# Patient Record
Sex: Female | Born: 1955 | Race: Black or African American | Hispanic: No | Smoking: Former smoker
Health system: Southern US, Community
[De-identification: ages and names within clinical notes are randomized; demographics above are authoritative.]

## PROBLEM LIST (undated history)

## (undated) DIAGNOSIS — R102 Pelvic and perineal pain: Secondary | ICD-10-CM

## (undated) DIAGNOSIS — E039 Hypothyroidism, unspecified: Secondary | ICD-10-CM

## (undated) DIAGNOSIS — R896 Abnormal cytological findings in specimens from other organs, systems and tissues: Secondary | ICD-10-CM

## (undated) DIAGNOSIS — M1990 Unspecified osteoarthritis, unspecified site: Secondary | ICD-10-CM

## (undated) DIAGNOSIS — N946 Dysmenorrhea, unspecified: Secondary | ICD-10-CM

## (undated) DIAGNOSIS — IMO0002 Reserved for concepts with insufficient information to code with codable children: Secondary | ICD-10-CM

## (undated) DIAGNOSIS — I2510 Atherosclerotic heart disease of native coronary artery without angina pectoris: Secondary | ICD-10-CM

## (undated) DIAGNOSIS — M5030 Other cervical disc degeneration, unspecified cervical region: Secondary | ICD-10-CM

## (undated) DIAGNOSIS — M5136 Other intervertebral disc degeneration, lumbar region: Secondary | ICD-10-CM

## (undated) DIAGNOSIS — K5730 Diverticulosis of large intestine without perforation or abscess without bleeding: Secondary | ICD-10-CM

## (undated) DIAGNOSIS — I10 Essential (primary) hypertension: Secondary | ICD-10-CM

## (undated) DIAGNOSIS — IMO0001 Reserved for inherently not codable concepts without codable children: Secondary | ICD-10-CM

## (undated) DIAGNOSIS — E785 Hyperlipidemia, unspecified: Secondary | ICD-10-CM

## (undated) DIAGNOSIS — Z8619 Personal history of other infectious and parasitic diseases: Secondary | ICD-10-CM

## (undated) DIAGNOSIS — N870 Mild cervical dysplasia: Secondary | ICD-10-CM

## (undated) DIAGNOSIS — N809 Endometriosis, unspecified: Secondary | ICD-10-CM

## (undated) DIAGNOSIS — E041 Nontoxic single thyroid nodule: Secondary | ICD-10-CM

## (undated) DIAGNOSIS — F419 Anxiety disorder, unspecified: Secondary | ICD-10-CM

## (undated) DIAGNOSIS — N63 Unspecified lump in unspecified breast: Secondary | ICD-10-CM

## (undated) DIAGNOSIS — K5190 Ulcerative colitis, unspecified, without complications: Secondary | ICD-10-CM

## (undated) DIAGNOSIS — Z86010 Personal history of colonic polyps: Secondary | ICD-10-CM

## (undated) HISTORY — PX: FOOT SURGERY: SHX648

## (undated) HISTORY — DX: Diverticulosis of large intestine without perforation or abscess without bleeding: K57.30

## (undated) HISTORY — DX: Other intervertebral disc degeneration, lumbar region: M51.36

## (undated) HISTORY — DX: Hypothyroidism, unspecified: E03.9

## (undated) HISTORY — DX: Abnormal cytological findings in specimens from other organs, systems and tissues: R89.6

## (undated) HISTORY — DX: Nontoxic single thyroid nodule: E04.1

## (undated) HISTORY — DX: Hyperlipidemia, unspecified: E78.5

## (undated) HISTORY — DX: Other cervical disc degeneration, unspecified cervical region: M50.30

## (undated) HISTORY — DX: Dysmenorrhea, unspecified: N94.6

## (undated) HISTORY — DX: Endometriosis, unspecified: N80.9

## (undated) HISTORY — DX: Unspecified lump in unspecified breast: N63.0

## (undated) HISTORY — DX: Essential (primary) hypertension: I10

## (undated) HISTORY — DX: Reserved for inherently not codable concepts without codable children: IMO0001

## (undated) HISTORY — PX: COLONOSCOPY: SHX174

## (undated) HISTORY — PX: TOTAL ABDOMINAL HYSTERECTOMY: SHX209

## (undated) HISTORY — DX: Personal history of other infectious and parasitic diseases: Z86.19

## (undated) HISTORY — DX: Pelvic and perineal pain: R10.2

## (undated) HISTORY — PX: BREAST BIOPSY: SHX20

## (undated) HISTORY — DX: Mild cervical dysplasia: N87.0

## (undated) HISTORY — PX: BACK SURGERY: SHX140

## (undated) HISTORY — DX: Personal history of colonic polyps: Z86.010

## (undated) HISTORY — DX: Reserved for concepts with insufficient information to code with codable children: IMO0002

---

## 1997-08-28 ENCOUNTER — Ambulatory Visit (HOSPITAL_COMMUNITY): Admission: RE | Admit: 1997-08-28 | Discharge: 1997-08-28 | Payer: Self-pay | Admitting: Obstetrics and Gynecology

## 1998-02-17 ENCOUNTER — Ambulatory Visit (HOSPITAL_COMMUNITY): Admission: RE | Admit: 1998-02-17 | Discharge: 1998-02-17 | Payer: Self-pay | Admitting: Obstetrics and Gynecology

## 1998-02-17 ENCOUNTER — Encounter: Payer: Self-pay | Admitting: Obstetrics and Gynecology

## 1998-03-04 ENCOUNTER — Encounter: Payer: Self-pay | Admitting: Obstetrics and Gynecology

## 1998-03-04 ENCOUNTER — Ambulatory Visit (HOSPITAL_COMMUNITY): Admission: RE | Admit: 1998-03-04 | Discharge: 1998-03-04 | Payer: Self-pay | Admitting: Obstetrics and Gynecology

## 1998-04-25 ENCOUNTER — Emergency Department (HOSPITAL_COMMUNITY): Admission: EM | Admit: 1998-04-25 | Discharge: 1998-04-25 | Payer: Self-pay | Admitting: Emergency Medicine

## 1998-04-25 ENCOUNTER — Encounter: Payer: Self-pay | Admitting: Endocrinology

## 1999-03-29 ENCOUNTER — Other Ambulatory Visit: Admission: RE | Admit: 1999-03-29 | Discharge: 1999-03-29 | Payer: Self-pay | Admitting: Obstetrics and Gynecology

## 1999-05-19 ENCOUNTER — Encounter: Payer: Self-pay | Admitting: Obstetrics and Gynecology

## 1999-05-19 ENCOUNTER — Ambulatory Visit (HOSPITAL_COMMUNITY): Admission: RE | Admit: 1999-05-19 | Discharge: 1999-05-19 | Payer: Self-pay | Admitting: Obstetrics and Gynecology

## 1999-05-27 ENCOUNTER — Encounter (INDEPENDENT_AMBULATORY_CARE_PROVIDER_SITE_OTHER): Payer: Self-pay

## 1999-05-27 ENCOUNTER — Other Ambulatory Visit: Admission: RE | Admit: 1999-05-27 | Discharge: 1999-05-27 | Payer: Self-pay | Admitting: Obstetrics and Gynecology

## 1999-10-07 ENCOUNTER — Other Ambulatory Visit: Admission: RE | Admit: 1999-10-07 | Discharge: 1999-10-07 | Payer: Self-pay | Admitting: Obstetrics and Gynecology

## 1999-11-09 ENCOUNTER — Ambulatory Visit (HOSPITAL_COMMUNITY): Admission: RE | Admit: 1999-11-09 | Discharge: 1999-11-09 | Payer: Self-pay | Admitting: Obstetrics and Gynecology

## 2000-04-05 ENCOUNTER — Other Ambulatory Visit: Admission: RE | Admit: 2000-04-05 | Discharge: 2000-04-05 | Payer: Self-pay | Admitting: Obstetrics and Gynecology

## 2000-05-24 ENCOUNTER — Encounter (INDEPENDENT_AMBULATORY_CARE_PROVIDER_SITE_OTHER): Payer: Self-pay

## 2000-05-24 ENCOUNTER — Other Ambulatory Visit: Admission: RE | Admit: 2000-05-24 | Discharge: 2000-05-24 | Payer: Self-pay | Admitting: Obstetrics and Gynecology

## 2000-05-25 ENCOUNTER — Encounter: Payer: Self-pay | Admitting: Obstetrics and Gynecology

## 2000-05-25 ENCOUNTER — Ambulatory Visit (HOSPITAL_COMMUNITY): Admission: RE | Admit: 2000-05-25 | Discharge: 2000-05-25 | Payer: Self-pay | Admitting: Obstetrics and Gynecology

## 2000-09-04 ENCOUNTER — Encounter: Admission: RE | Admit: 2000-09-04 | Discharge: 2000-09-04 | Payer: Self-pay | Admitting: Emergency Medicine

## 2000-09-04 ENCOUNTER — Encounter: Payer: Self-pay | Admitting: Emergency Medicine

## 2000-09-22 ENCOUNTER — Encounter: Payer: Self-pay | Admitting: Emergency Medicine

## 2000-09-22 ENCOUNTER — Encounter: Admission: RE | Admit: 2000-09-22 | Discharge: 2000-09-22 | Payer: Self-pay | Admitting: Emergency Medicine

## 2000-11-23 ENCOUNTER — Encounter: Admission: RE | Admit: 2000-11-23 | Discharge: 2000-11-23 | Payer: Self-pay | Admitting: Obstetrics and Gynecology

## 2000-11-23 ENCOUNTER — Encounter: Payer: Self-pay | Admitting: Obstetrics and Gynecology

## 2000-12-03 ENCOUNTER — Ambulatory Visit (HOSPITAL_COMMUNITY): Admission: RE | Admit: 2000-12-03 | Discharge: 2000-12-03 | Payer: Self-pay | Admitting: Neurosurgery

## 2000-12-03 ENCOUNTER — Encounter: Payer: Self-pay | Admitting: Neurosurgery

## 2000-12-14 ENCOUNTER — Emergency Department (HOSPITAL_COMMUNITY): Admission: EM | Admit: 2000-12-14 | Discharge: 2000-12-14 | Payer: Self-pay

## 2000-12-14 ENCOUNTER — Encounter: Payer: Self-pay | Admitting: Emergency Medicine

## 2001-01-24 ENCOUNTER — Other Ambulatory Visit: Admission: RE | Admit: 2001-01-24 | Discharge: 2001-01-24 | Payer: Self-pay | Admitting: Obstetrics and Gynecology

## 2001-02-05 ENCOUNTER — Encounter: Payer: Self-pay | Admitting: Neurosurgery

## 2001-02-05 ENCOUNTER — Ambulatory Visit (HOSPITAL_COMMUNITY): Admission: RE | Admit: 2001-02-05 | Discharge: 2001-02-05 | Payer: Self-pay | Admitting: Neurosurgery

## 2001-04-08 ENCOUNTER — Other Ambulatory Visit: Admission: RE | Admit: 2001-04-08 | Discharge: 2001-04-08 | Payer: Self-pay | Admitting: Obstetrics and Gynecology

## 2001-05-28 ENCOUNTER — Ambulatory Visit (HOSPITAL_COMMUNITY): Admission: RE | Admit: 2001-05-28 | Discharge: 2001-05-28 | Payer: Self-pay | Admitting: Obstetrics and Gynecology

## 2001-05-28 ENCOUNTER — Encounter: Payer: Self-pay | Admitting: Obstetrics and Gynecology

## 2001-06-06 ENCOUNTER — Encounter: Payer: Self-pay | Admitting: Obstetrics and Gynecology

## 2001-06-06 ENCOUNTER — Encounter: Admission: RE | Admit: 2001-06-06 | Discharge: 2001-06-06 | Payer: Self-pay | Admitting: Obstetrics and Gynecology

## 2001-12-03 ENCOUNTER — Encounter: Admission: RE | Admit: 2001-12-03 | Discharge: 2001-12-03 | Payer: Self-pay | Admitting: Obstetrics and Gynecology

## 2001-12-03 ENCOUNTER — Encounter: Payer: Self-pay | Admitting: Obstetrics and Gynecology

## 2001-12-18 ENCOUNTER — Other Ambulatory Visit: Admission: RE | Admit: 2001-12-18 | Discharge: 2001-12-18 | Payer: Self-pay | Admitting: Obstetrics and Gynecology

## 2001-12-27 ENCOUNTER — Encounter: Payer: Self-pay | Admitting: Obstetrics and Gynecology

## 2001-12-27 ENCOUNTER — Encounter: Admission: RE | Admit: 2001-12-27 | Discharge: 2001-12-27 | Payer: Self-pay | Admitting: Obstetrics and Gynecology

## 2002-01-30 ENCOUNTER — Encounter: Payer: Self-pay | Admitting: Emergency Medicine

## 2002-01-30 ENCOUNTER — Encounter: Admission: RE | Admit: 2002-01-30 | Discharge: 2002-01-30 | Payer: Self-pay | Admitting: Emergency Medicine

## 2002-04-16 ENCOUNTER — Other Ambulatory Visit: Admission: RE | Admit: 2002-04-16 | Discharge: 2002-04-16 | Payer: Self-pay | Admitting: Obstetrics and Gynecology

## 2002-05-01 HISTORY — PX: ANTERIOR CERVICAL DECOMP/DISCECTOMY FUSION: SHX1161

## 2002-06-24 ENCOUNTER — Encounter: Admission: RE | Admit: 2002-06-24 | Discharge: 2002-06-24 | Payer: Self-pay | Admitting: Obstetrics and Gynecology

## 2002-06-24 ENCOUNTER — Encounter: Payer: Self-pay | Admitting: Obstetrics and Gynecology

## 2002-09-17 ENCOUNTER — Other Ambulatory Visit: Admission: RE | Admit: 2002-09-17 | Discharge: 2002-09-17 | Payer: Self-pay | Admitting: Obstetrics and Gynecology

## 2002-11-07 ENCOUNTER — Encounter (INDEPENDENT_AMBULATORY_CARE_PROVIDER_SITE_OTHER): Payer: Self-pay | Admitting: Specialist

## 2002-11-07 ENCOUNTER — Ambulatory Visit (HOSPITAL_COMMUNITY): Admission: RE | Admit: 2002-11-07 | Discharge: 2002-11-07 | Payer: Self-pay | Admitting: Gastroenterology

## 2003-03-12 ENCOUNTER — Encounter (INDEPENDENT_AMBULATORY_CARE_PROVIDER_SITE_OTHER): Payer: Self-pay | Admitting: *Deleted

## 2003-03-12 ENCOUNTER — Ambulatory Visit (HOSPITAL_BASED_OUTPATIENT_CLINIC_OR_DEPARTMENT_OTHER): Admission: RE | Admit: 2003-03-12 | Discharge: 2003-03-12 | Payer: Self-pay | Admitting: Plastic Surgery

## 2003-04-22 ENCOUNTER — Other Ambulatory Visit: Admission: RE | Admit: 2003-04-22 | Discharge: 2003-04-22 | Payer: Self-pay | Admitting: Obstetrics and Gynecology

## 2003-07-09 ENCOUNTER — Encounter: Admission: RE | Admit: 2003-07-09 | Discharge: 2003-07-09 | Payer: Self-pay | Admitting: Obstetrics and Gynecology

## 2003-07-22 ENCOUNTER — Encounter: Admission: RE | Admit: 2003-07-22 | Discharge: 2003-07-22 | Payer: Self-pay | Admitting: Emergency Medicine

## 2003-07-28 ENCOUNTER — Inpatient Hospital Stay (HOSPITAL_COMMUNITY): Admission: RE | Admit: 2003-07-28 | Discharge: 2003-07-30 | Payer: Self-pay | Admitting: Obstetrics and Gynecology

## 2003-07-28 ENCOUNTER — Encounter (INDEPENDENT_AMBULATORY_CARE_PROVIDER_SITE_OTHER): Payer: Self-pay | Admitting: *Deleted

## 2004-02-07 ENCOUNTER — Encounter: Admission: RE | Admit: 2004-02-07 | Discharge: 2004-02-07 | Payer: Self-pay | Admitting: Emergency Medicine

## 2004-05-10 ENCOUNTER — Other Ambulatory Visit: Admission: RE | Admit: 2004-05-10 | Discharge: 2004-05-10 | Payer: Self-pay | Admitting: Obstetrics and Gynecology

## 2004-05-16 ENCOUNTER — Encounter: Admission: RE | Admit: 2004-05-16 | Discharge: 2004-05-16 | Payer: Self-pay | Admitting: Obstetrics and Gynecology

## 2004-05-30 ENCOUNTER — Encounter: Admission: RE | Admit: 2004-05-30 | Discharge: 2004-05-30 | Payer: Self-pay | Admitting: Obstetrics and Gynecology

## 2005-01-25 ENCOUNTER — Ambulatory Visit (HOSPITAL_COMMUNITY): Admission: RE | Admit: 2005-01-25 | Discharge: 2005-01-25 | Payer: Self-pay | Admitting: Emergency Medicine

## 2005-05-24 ENCOUNTER — Other Ambulatory Visit: Admission: RE | Admit: 2005-05-24 | Discharge: 2005-05-24 | Payer: Self-pay | Admitting: Obstetrics and Gynecology

## 2005-06-07 ENCOUNTER — Encounter: Admission: RE | Admit: 2005-06-07 | Discharge: 2005-06-07 | Payer: Self-pay | Admitting: Obstetrics and Gynecology

## 2006-02-16 ENCOUNTER — Encounter: Admission: RE | Admit: 2006-02-16 | Discharge: 2006-02-16 | Payer: Self-pay | Admitting: Emergency Medicine

## 2006-11-22 ENCOUNTER — Encounter: Admission: RE | Admit: 2006-11-22 | Discharge: 2006-11-22 | Payer: Self-pay | Admitting: Obstetrics and Gynecology

## 2008-01-31 ENCOUNTER — Ambulatory Visit (HOSPITAL_COMMUNITY): Admission: RE | Admit: 2008-01-31 | Discharge: 2008-01-31 | Payer: Self-pay | Admitting: Obstetrics and Gynecology

## 2008-02-10 ENCOUNTER — Encounter: Admission: RE | Admit: 2008-02-10 | Discharge: 2008-02-10 | Payer: Self-pay | Admitting: Obstetrics and Gynecology

## 2008-03-02 ENCOUNTER — Ambulatory Visit (HOSPITAL_BASED_OUTPATIENT_CLINIC_OR_DEPARTMENT_OTHER): Admission: RE | Admit: 2008-03-02 | Discharge: 2008-03-02 | Payer: Self-pay

## 2008-09-10 ENCOUNTER — Encounter: Admission: RE | Admit: 2008-09-10 | Discharge: 2008-09-10 | Payer: Self-pay | Admitting: Obstetrics and Gynecology

## 2008-09-18 ENCOUNTER — Ambulatory Visit: Payer: Self-pay | Admitting: Internal Medicine

## 2008-10-14 ENCOUNTER — Ambulatory Visit: Payer: Self-pay | Admitting: Gastroenterology

## 2008-10-14 ENCOUNTER — Encounter: Payer: Self-pay | Admitting: Gastroenterology

## 2008-10-16 ENCOUNTER — Encounter: Payer: Self-pay | Admitting: Gastroenterology

## 2008-10-21 ENCOUNTER — Telehealth: Payer: Self-pay | Admitting: Gastroenterology

## 2008-10-30 ENCOUNTER — Ambulatory Visit (HOSPITAL_COMMUNITY): Admission: RE | Admit: 2008-10-30 | Discharge: 2008-10-30 | Payer: Self-pay | Admitting: Internal Medicine

## 2008-11-05 DIAGNOSIS — Z860100 Personal history of colon polyps, unspecified: Secondary | ICD-10-CM | POA: Insufficient documentation

## 2008-11-05 DIAGNOSIS — K5730 Diverticulosis of large intestine without perforation or abscess without bleeding: Secondary | ICD-10-CM | POA: Insufficient documentation

## 2008-11-10 ENCOUNTER — Ambulatory Visit: Payer: Self-pay | Admitting: Gastroenterology

## 2008-11-10 DIAGNOSIS — E039 Hypothyroidism, unspecified: Secondary | ICD-10-CM

## 2008-11-10 HISTORY — DX: Hypothyroidism, unspecified: E03.9

## 2009-06-01 HISTORY — PX: THYROIDECTOMY, PARTIAL: SHX18

## 2010-05-22 ENCOUNTER — Encounter: Payer: Self-pay | Admitting: Obstetrics and Gynecology

## 2010-05-31 NOTE — Procedures (Signed)
Summary: Colonoscopy   Colonoscopy  Procedure date:  10/14/2008  Findings:      Location:  [facility].    Procedures Next Due Date:    Colonoscopy: 10/2011  COLONOSCOPY PROCEDURE REPORT  PATIENT:  Deborah, Stewart  MR#:  [id number] BIRTHDATE:   1955-08-29, 55 yrs. old   GENDER:   female  ENDOSCOPIST:   Vania Rea. Jarold Motto, MD, [facility] Referred by: Dorothyann Peng, M.D.  PROCEDURE DATE:  10/14/2008 PROCEDURE:  Colonoscopy with hot biopsy, Colonoscopy with snare polypectomy ASA CLASS:   Class II INDICATIONS: history of pre-cancerous (adenomatous) colon polyps   MEDICATIONS:    Fentanyl 125 mcg IV, Versed 12 mg  DESCRIPTION OF PROCEDURE:   After the risks benefits and alternatives of the procedure were thoroughly explained, informed consent was obtained.  Digital rectal exam was performed and revealed no abnormalities.   The LB [id number] endoscope was introduced through the anus and advanced to the cecum, which was identified by both the appendix and ileocecal valve, limited by a tortuous and redundant colon, poor preparation.    The quality of the prep was good, using MoviPrep.  The instrument was then slowly withdrawn as the colon was fully examined. <<PROCEDUREIMAGES>>      <<OLD IMAGES>>  FINDINGS:  Moderate diverticulosis was found sigmoid to descending  A sessile polyp was found in the descending colon. It was hemorrhagic and large. Polyp was snared, then cauterized with monopolar cautery. Retrieval was successful. snare polyp  There were multiple polyps identified and removed. in the rectum. They were diminutive and likely benign. With hot biopsy forceps, biopsy was obtained and sent to pathology.   Retroflexed views in the rectum revealed hemorrhoids.    The scope was then withdrawn from the patient and the procedure completed.  COMPLICATIONS:   None  ENDOSCOPIC IMPRESSION:  1) Moderate diverticulosis in the sigmoid to descending  2) Sessile polyp in the  descending colon  3) Polyps, multiple in the rectum  4) Hemorrhoids RECOMMENDATIONS:  1) high fiber diet  2) If the polyp(s) removed today are proven to be adenomatous (pre-cancerous) polyps, you will need a colonoscopy in 3 years. Otherwise you should continue to follow colorectal cancer screening guidelines for "routine risk" patients with a colonoscopy in 10 years.  REPEAT EXAM:   No   _______________________________ Vania Rea. Jarold Motto, MD, [facility]  CC:        REPORT OF SURGICAL PATHOLOGY   Case #: [id number] Patient Name: Deborah Stewart, Deborah Stewart. Office Chart Number:  [id number]   MRN: [id number] Pathologist: Alden Server A. Delila Spence, MD DOB/Age  09-09-1955 (Age: 55)    Gender: F Date Taken:  10/14/2008 Date Received: 10/15/2008   FINAL DIAGNOSIS   ***MICROSCOPIC EXAMINATION AND DIAGNOSIS***   1. DESCENDING COLON, POLYP:  TUBULAR ADENOMA.  NO HIGH GRADE DYSPLASIA OR MALIGNANCY IDENTIFIED.   2. RECTUM, POLYP(S):  HYPERPLASTIC POLYPS (2). NO ADENOMATOUS CHANGES OR EVIDENCE OF MALIGNANCY IDENTIFIED (BIOPSY).    jy Date Reported:  10/16/2008     Alden Server A. Delila Spence, MD *** Electronically Signed Out By [facility] ***    October 16, 2008 MRN: [id number]    [facility] [address] [place], [place]  [address]    Dear Deborah Stewart,  I am pleased to inform you that the colon polyp(s) removed during your recent colonoscopy was (were) found to be benign (no cancer detected) upon pathologic examination.  I recommend you have a repeat colonoscopy examination in 3 years to look for recurrent polyps, as having colon  polyps increases your risk for having recurrent polyps or even colon cancer in the future.  Should you develop new or worsening symptoms of abdominal pain, bowel habit changes or bleeding from the rectum or bowels, please schedule an evaluation with either your primary care physician or with me.  Additional information/recommendations:  __ No further action with  gastroenterology is needed at this time. Please      follow-up with your primary care physician for your other healthcare      needs.  __ Please call [phone] to schedule a return visit to review your      situation.  __ Please keep your follow-up visit as already scheduled.  xx__ Continue treatment plan as outlined the day of your exam.  Please call us if you are having persistent problems or have questions about your condition that have not been fully answered at this time.  Sincerely,  Mardella Layman MD [facility]  This letter has been electronically signed by your physician.   This report was created from the original endoscopy report, which was reviewed and signed by the above listed endoscopist.

## 2010-05-31 NOTE — Progress Notes (Signed)
Summary: ? re fu  Phone Note Call from Patient Call back at Home Phone [phone]   Caller: Patient Call For: patterson Reason for Call: Talk to Nurse Summary of Call: Patient wants to speak to nurse regarding a message on her machine that stated for her to make a f/u ov after procedure but she received her results letter and it does'nt say anything about a f/u appt. Initial call taken by: Tawni Levy,  October 21, 2008 3:12 PM  Follow-up for Phone Call        Left message on patients machine to call back and schedule appt with Dr. Jarold Motto around 10-29-2008.  Follow-up by: Harlow Mares CMA,  October 21, 2008 3:51 PM

## 2010-05-31 NOTE — Assessment & Plan Note (Signed)
Summary: follow up colon/lk   History of Present Illness Visit Type: Initial Visit Primary GI MD: Sheryn Bison MD FACP FAGA Primary Provider: Dorothyann Peng, MD Chief Complaint: f/u colonoscopy to discuss results. Pt denies any GI sx.  History of Present Illness:   This patient is a 55 year old after American female who recently completed a colonoscopy screen was found to have multiple hyperplastic rectosigmoid nodules and a flat adenomatous polyp in her descending colon with extensive diverticulosis coli. She denies any gastrointestinal problems at this time is following a high-fiber diet. She does have a history of chronic thyroid dysfunction otherwise is healthy. She has had previous hysterectomy. There is no family history of colon carcinoma but she does have a family history of breast and lung cancer. Her primary care physician is Dr. Velna Hatchet. She currently is on Chantix for smoking cessation, Synthroid, HCTZ, and calcium with vitamin D.   GI Review of Systems      Denies abdominal pain, acid reflux, belching, bloating, chest pain, dysphagia with liquids, dysphagia with solids, heartburn, loss of appetite, nausea, vomiting, vomiting blood, weight loss, and  weight gain.        Denies anal fissure, black tarry stools, change in bowel habit, constipation, diarrhea, diverticulosis, fecal incontinence, heme positive stool, hemorrhoids, irritable bowel syndrome, jaundice, light color stool, liver problems, rectal bleeding, and  rectal pain.    Current Medications (verified): 1)  Synthroid 75 Mcg Tabs (Levothyroxine Sodium) .... Monday- Friday One Tablet By Mouth Once Daily 2)  Synthroid 50 Mcg Tabs (Levothyroxine Sodium) .... Saturday and Sunday One Tablet By Mouth Once Daily 3)  Chantix 1 Mg Tabs (Varenicline Tartrate) .... One Tablet By Mouth Once Daily 4)  Hydrochlorothiazide 12.5 Mg Caps (Hydrochlorothiazide) .... One Capsule By Mouth Once Daily 5)  Multivitamins   Tabs (Multiple  Vitamin) .... One Tablet By Mouth Once Daily 6)  Fish Oil 1000 Mg Caps (Omega-3 Fatty Acids) .... 2 Tablets By Mouth Once Daily 7)  Calcium/vitamin D/minerals 600-200 Mg-Unit Tabs (Calcium Carbonate-Vit D-Min) .... One Tablet By Mouth Once Daily  Allergies (verified): 1)  ! Codeine  Past History:  Past medical, surgical, family and social histories (including risk factors) reviewed for relevance to current acute and chronic problems.  Past Medical History: Current Problems:  HYPOTHYROIDISM (ICD-244.9) COLONIC POLYPS, HYPERPLASTIC, HX OF (ICD-V12.72) DIVERTICULOSIS, COLON (ICD-562.10)    Past Surgical History: Hysterectomy Foot surgery L4 L5 back surgery  Family History: Reviewed history and no changes required. No FH of Colon Cancer: Family History of Breast Cancer: Sister, Great Aunt Lung cancer: Uncle Family History of Diabetes: Mother, Father, Grandparents Family History of Heart Disease: Grandfather  Social History: Reviewed history and no changes required. Divorced Patient currently smokes.  Alcohol Use - yes Daily Caffeine Use Illicit Drug Use - no Smoking Status:  current Drug Use:  no  Review of Systems  The patient denies allergy/sinus, anemia, anxiety-new, arthritis/joint pain, back pain, blood in urine, breast changes/lumps, change in vision, confusion, cough, coughing up blood, depression-new, fainting, fatigue, fever, headaches-new, hearing problems, heart murmur, heart rhythm changes, itching, menstrual pain, muscle pains/cramps, night sweats, nosebleeds, pregnancy symptoms, shortness of breath, skin rash, sleeping problems, sore throat, swelling of feet/legs, swollen lymph glands, thirst - excessive , urination - excessive , urination changes/pain, urine leakage, vision changes, and voice change.    Vital Signs:  Patient profile:   55 year old female Height:      65 inches Weight:      16 0.50 pounds  BMI:     26.81 Pulse rate:   70 / minute BP  sitting:   108 / 72  (right arm) Cuff size:   regular  Vitals Entered By: Christie Nottingham CMA Duncan Dull) (November 10, 2008 3:13 PM)  Physical Exam  General:  Well developed, well nourished, no acute distress.healthy appearing.   Head:  Normocephalic and atraumatic. Eyes:  PERRLA, no icterus.exam deferred to patient's ophthalmologist.   Psych:  Alert and cooperative. Normal mood and affect.   Impression & Recommendations:  Problem # 1:  COLONIC POLYPS, HYPERPLASTIC, HX OF (ICD-V12.72) Assessment Unchanged One of her her recent polyps was adenomatous pathologically. Previous colonoscopy by Dr. Loreta Ave was remarked for hyperplastic polyps. I discussed colonic polyposis with her in detail and we will repeat her exam in 3 years time at her request and also per her family history of multiple sites of carcinoma.  Problem # 2:  DIVERTICULOSIS, COLON (ICD-562.10) Assessment: Improved Continue high-fiber diet as tolerated with fiber supplements as needed.  Problem # 3:  HYPOTHYROIDISM (ICD-244.9) Assessment: Improved Continue medications per primary care.  Patient Instructions: 1)  Copy sent to : Dr. Velna Hatchet 2)  Please continue current medications.  3)  Diet should be high in fiber ( fruits, vegetables, whole grains) but low in residue. Drink at least eight (8) glasses of water a day.  4)  Diverticular Disease brochure given.  5)  Colonoscopy followup in 3 years time. 6)  Colorectal Cancer Screening handout given.   Appended Document: follow up colon/lk patient watched divertic movie in the office today.

## 2010-05-31 NOTE — Miscellaneous (Signed)
Summary: [facility]  Clinical Lists Changes  Medications: Added new medication of MIRALAX   POWD (POLYETHYLENE GLYCOL 3350) As per prep  instructions. - Signed Added new medication of DULCOLAX 5 MG  TBEC (BISACODYL) Day before procedure take 2 at 3pm and 2 at 8pm. - Signed Added new medication of REGLAN 10 MG  TABS (METOCLOPRAMIDE HCL) As per prep instructions. - Signed Rx of MIRALAX   POWD (POLYETHYLENE GLYCOL 3350) As per prep  instructions.;  #255gm x 0;  Signed;  Entered by: Ezra Sites RN;  Authorized by: Hart Carwin;  Method used: Electronically to [facility]  [address] [id number] [address], [place], [place]  [address], Ph: [phone] or [phone], Fax: [phone] Rx of DULCOLAX 5 MG  TBEC (BISACODYL) Day before procedure take 2 at 3pm and 2 at 8pm.;  #4 x 0;  Signed;  Entered by: Ezra Sites RN;  Authorized by: Hart Carwin;  Method used: Electronically to [facility]  [address] [facility] [address] [place], [place]  [address], Ph: [phone] or [phone], Fax: [phone] Rx of REGLAN 10 MG  TABS (METOCLOPRAMIDE HCL) As per prep instructions.;  #2 x 0;  Signed;  Entered by: Ezra Sites RN;  Authorized by: Hart Carwin;  Method used: Electronically [facility]  [address] [facility] [address] [place], [place]  [address], Ph: [phone] or [phone], Fax: [phone] Allergies: Added new allergy or adverse reaction of CODEINE Observations: Added new observation of NKA: F (09/18/2008 7:58)    Prescriptions: REGLAN 10 MG  TABS (METOCLOPRAMIDE HCL) As per prep instructions.  #2 x 0   Entered by:   Ezra Sites RN   Authorized by:   Hart Carwin   Signed by:   Ezra Sites RN on 09/18/2008   Method used:   [facility]ically to        [facility]  [address] [id number]ail)       [address]       [place], [place]  [address]       Ph: [phone] or [phone]       Fax: [phone]   RxID:   [id number] DULCOLAX 5 MG  TBEC  (BISACODYL) Day before procedure take 2 at 3pm and 2 at 8pm.  #4 x 0   Entered by:   Ezra Sites RN   Authorized by:   Hart Carwin   Signed by:   Ezra Sites RN on 09/18/2008   Method used:   [facility]ronically to        [facility]  [address] [facility] ([facility])       [address]       [place], [place]  [address]       Ph: [phone] or [phone]       Fax: [phone]   RxID:   [id number] MIRALAX   POWD (POLYETHYLENE GLYCOL 3350) As per prep  instructions.  #255gm x 0   Entered by:   Ezra Sites RN   Authorized by:   Hart Carwin   Signed by:   Ezra Sites RN on 09/18/2008   Method used:   [facility]ically to        [facility]  [address] [facility])       [address]       [place], [place]  [address]  Ph: [phone] or [phone]       Fax: [phone]   RxID:   [id number]

## 2010-05-31 NOTE — Letter (Signed)
Summary: Patient Notice- Polyp Results  [facility]  [address] [place], [place] [address]   Phone: [phone]  Fax: [phone]        October 16, 2008 MRN: [id number]    [facility] [address] [place], [place]  [address]    Dear Ms. HUNT-Ginsberg,  I am pleased to inform you that the colon polyp(s) removed during your recent colonoscopy was (were) found to be benign (no cancer detected) upon pathologic examination.  I recommend you have a repeat colonoscopy examination in 3 years to look for recurrent polyps, as having colon polyps increases your risk for having recurrent polyps or even colon cancer in the future.  Should you develop new or worsening symptoms of abdominal pain, bowel habit changes or bleeding from the rectum or bowels, please schedule an evaluation with either your primary care physician or with me.  Additional information/recommendations:  __ No further action with gastroenterology is needed at this time. Please      follow-up with your primary care physician for your other healthcare      needs.  __ Please call [phone] to schedule a return visit to review your      situation.  __ Please keep your follow-up visit as already scheduled.  xx__ Continue treatment plan as outlined the day of your exam.  Please call us if you are having persistent problems or have questions about your condition that have not been fully answered at this time.  Sincerely,  Mardella Layman MD [facility]  This letter has been electronically signed by your physician.

## 2010-06-06 ENCOUNTER — Inpatient Hospital Stay (INDEPENDENT_AMBULATORY_CARE_PROVIDER_SITE_OTHER)
Admission: RE | Admit: 2010-06-06 | Discharge: 2010-06-06 | Disposition: A | Discharge status: Home / Self Care | Payer: 59 | Source: Ambulatory Visit | Attending: Family Medicine | Admitting: Family Medicine

## 2010-06-06 DIAGNOSIS — K5289 Other specified noninfective gastroenteritis and colitis: Secondary | ICD-10-CM

## 2010-06-08 ENCOUNTER — Encounter (INDEPENDENT_AMBULATORY_CARE_PROVIDER_SITE_OTHER): Payer: Self-pay | Admitting: *Deleted

## 2010-06-16 NOTE — Letter (Signed)
Summary: New Patient letter  [facility]  [address]   [place], [place] [address]   Phone: [phone]  Fax: [phone]       06/08/2010 MRN: [id number]  [facility] [address] [place], [place]  [address]  Dear Deborah Stewart,  Welcome to the Gastroenterology Division at [facility].    You are scheduled to see Dr.  Jarold Motto on 07/08/2010 at 9:00 on the 3rd floor at [facility], [address]  We ask that you try to arrive at our office 15 minutes prior to your appointment time to allow for check-in.  We would like you to complete the enclosed self-administered evaluation form prior to your visit and bring it with you on the day of your appointment.  We will review it with you.  Also, please bring a complete list of all your medications or, if you prefer, bring the medication bottles and we will list them.  Please bring your insurance card so that we may make a copy of it.  If your insurance requires a referral to see a specialist, please bring your referral form from your primary care physician.  Co-payments are due at the time of your visit and may be paid by cash, check or credit card.     Your office visit will consist of a consult with your physician (includes a physical exam), any laboratory testing he/she may order, scheduling of any necessary diagnostic testing (e.g. x-ray, ultrasound, CT-scan), and scheduling of a procedure (e.g. Endoscopy, Colonoscopy) if required.  Please allow enough time on your schedule to allow for any/all of these possibilities.    If you cannot keep your appointment, please call [phone] to cancel or reschedule prior to your appointment date.  This allows [place] the opportunity to schedule an appointment for another patient in need of care.  If you do not cancel or reschedule by 5 p.m. the business day prior to your appointment date, you will be charged a $50.00 late cancellation/no-show fee.    Thank you for  choosing [facility] for your medical needs.  We appreciate the opportunity to care for you.  Please visit [place] at our website  to learn more about our practice.                     Sincerely,                                                             The Gastroenterology Division

## 2010-06-30 DIAGNOSIS — Z860100 Personal history of colon polyps, unspecified: Secondary | ICD-10-CM

## 2010-06-30 HISTORY — DX: Personal history of colon polyps, unspecified: Z86.0100

## 2010-06-30 HISTORY — DX: Personal history of colonic polyps: Z86.010

## 2010-07-08 ENCOUNTER — Encounter: Payer: Self-pay | Admitting: Gastroenterology

## 2010-07-08 ENCOUNTER — Encounter (INDEPENDENT_AMBULATORY_CARE_PROVIDER_SITE_OTHER): Payer: Self-pay | Admitting: *Deleted

## 2010-07-08 ENCOUNTER — Ambulatory Visit (INDEPENDENT_AMBULATORY_CARE_PROVIDER_SITE_OTHER): Payer: 59 | Admitting: Gastroenterology

## 2010-07-08 ENCOUNTER — Other Ambulatory Visit: Payer: Self-pay | Admitting: Gastroenterology

## 2010-07-08 ENCOUNTER — Other Ambulatory Visit: Payer: 59

## 2010-07-08 DIAGNOSIS — K625 Hemorrhage of anus and rectum: Secondary | ICD-10-CM

## 2010-07-08 LAB — VITAMIN B12: Vitamin B-12: 547 pg/mL (ref 211–911)

## 2010-07-08 LAB — IBC PANEL
Iron: 71 ug/dL (ref 42–145)
Saturation Ratios: 17.9 % — ABNORMAL LOW (ref 20.0–50.0)
Transferrin: 283.4 mg/dL (ref 212.0–360.0)

## 2010-07-08 LAB — SEDIMENTATION RATE: Sed Rate: 25 mm/hr — ABNORMAL HIGH (ref 0–22)

## 2010-07-08 LAB — FOLATE: Folate: 9.5 ng/mL (ref >5.9)

## 2010-07-08 LAB — HIGH SENSITIVITY CRP: CRP, High Sensitivity: 9.34 mg/L — ABNORMAL HIGH (ref 0.00–5.00)

## 2010-07-08 LAB — FERRITIN: Ferritin: 105.8 ng/mL (ref 10.0–291.0)

## 2010-07-12 NOTE — Assessment & Plan Note (Signed)
Summary: Blood in stool   History of Present Illness Visit Type: Follow-up Visit Primary GI MD: Sheryn Bison MD FACP FAGA Primary Provider: Dorothyann Peng, MD Requesting Provider: n/a Chief Complaint: Blood in stool wants to discuss possiblelly having another colonoscopy, had a colonoscopy in June 2010.Deborah Stewart History of Present Illness:    this lady comes for evaluation of rectal bleeding. This occurred one month ago. It was associated with cramping and lower abdominal pain. she she was seen in the emergency room and apparently had guaiac positive stools. there is no history of recent decongestant use , estrogen therapy, antibiotics, trauma, or dehydration.   She had colonoscopy 2 years ago with removal of an adenomatous colon polyp. she currently is asymptomatic. he does have hypertension and chronic thyroid dysfunction. there is no history of fever, chills, she did have some nausea and vomiting was her initial illness suggestion of possible viral infection. 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   GI Review of Systems    Reports vomiting.      Denies abdominal pain, acid reflux, belching, bloating, chest pain, dysphagia with liquids, dysphagia with solids, heartburn, loss of appetite, nausea, vomiting blood, weight loss, and  weight gain.      Reports diarrhea and  rectal bleeding.     Denies anal fissure, black tarry stools, change in bowel habit, constipation, diverticulosis, fecal incontinence, heme positive stool, hemorrhoids, irritable bowel syndrome, jaundice, light color stool, liver problems, and  rectal pain.    Current Medications (verified): 1)  Synthroid 100 Mcg Tabs (Levothyroxine Sodium) .... 2 Tablets Mon Wed Fri and 1 Tab Every Other Day 2)  Synthroid 50 Mcg Tabs (Levothyroxine Sodium) .... Saturday and Sunday One Tablet By Mouth Once Daily 3)  Hydrochlorothiazide 12.5 Mg Caps (Hydrochlorothiazide) .... One Capsule By Mouth Once Daily 4)   Multivitamins   Tabs (Multiple Vitamin) .... One Tablet By Mouth Once Daily 5)  Fish Oil 1000 Mg Caps (Omega-3 Fatty Acids) .... 2 Tablets By Mouth Once Daily 6)  Calcium/vitamin D/minerals 600-200 Mg-Unit Tabs (Calcium Carbonate-Vit D-Min) .... One Tablet By Mouth Once Daily 7)  Lipitor 10 Mg Tabs (Atorvastatin Calcium) .Deborah Stewart.. 1 By Mouth Once Daily  Allergies (verified): 1)  ! Codeine  Past History:  Family History: Last updated: 11/10/2008 No FH of Colon Cancer: Family History of Breast Cancer: Sister, Great Aunt Lung cancer: Uncle Family History of Diabetes: Mother, Father, Grandparents Family History of Heart Disease: Grandfather  Social History: Last updated: 11/10/2008 Divorced Patient currently smokes.  Alcohol Use - yes Daily Caffeine Use Illicit Drug Use - no  Past medical, surgical, family and social histories (including risk factors) reviewed for relevance to current acute and chronic problems.  Past Medical History: Reviewed history from 11/10/2008 and no changes required. Current Problems:  HYPOTHYROIDISM (ICD-244.9) COLONIC POLYPS, HYPERPLASTIC, HX OF (ICD-V12.72) DIVERTICULOSIS, COLON (ICD-562.10)    Past Surgical History: Reviewed history from 11/10/2008 and no changes required. Hysterectomy Foot surgery L4 L5 back surgery  Family History: Reviewed history from 11/10/2008 and no changes required. No FH of Colon Cancer: Family History of Breast Cancer: Sister, Great Aunt Lung cancer: Uncle Family History of Diabetes: Mother, Father, Grandparents Family History of Heart Disease: Grandfather  Social History: Reviewed history from 11/10/2008 and no changes required. Divorced Patient currently smokes.  Alcohol Use - yes Daily Caffeine Use Illicit Drug Use - no  Review of Systems  The patient denies allergy/sinus, anemia, anxiety-new, arthritis/joint pain, back pain, blood  in urine, breast changes/lumps, change in vision, confusion, cough, coughing  up blood, depression-new, fainting, fatigue, fever, headaches-new, hearing problems, heart murmur, heart rhythm changes, itching, menstrual pain, muscle pains/cramps, night sweats, nosebleeds, pregnancy symptoms, shortness of breath, skin rash, sleeping problems, sore throat, swelling of feet/legs, swollen lymph glands, thirst - excessive , urination - excessive , urination changes/pain, urine leakage, vision changes, and voice change.    Vital Signs:  Patient profile:   55 year old female Height:      65 inches Weight:      182 pounds BMI:     30.40 BSA:     1.90 Pulse rate:   80 / minute Pulse rhythm:   regular BP sitting:   102 / 80  (left arm)  Vitals Entered By: Merri Ray CMA Duncan Dull) (July 08, 2010 9:15 AM)  Physical Exam  General:  Well developed, well nourished, no acute distress.healthy appearing.   Head:  Normocephalic and atraumatic. Eyes:  PERRLA, no icterus. Lungs:  Clear throughout to auscultation. Heart:  Regular rate and rhythm; no murmurs, rubs,  or bruits. Abdomen:  Soft, nontender and nondistended. No masses, hepatosplenomegaly or hernias noted. Normal bowel sounds. Rectal:  Normal exam.hemocult negative.   Msk:  Symmetrical with no gross deformities. Normal posture. Pulses:  Normal pulses noted. Extremities:  No clubbing, cyanosis, edema or deformities noted. Neurologic:  Alert and  oriented x4;  grossly normal neurologically. Skin:  Intact without significant lesions or rashes. Cervical Nodes:  No significant cervical adenopathy. Psych:  Alert and cooperative. Normal mood and affect.   Impression & Recommendations:  Problem # 1:  RECTAL BLEEDING (ICD-569.3) Assessment Improved  differential diagnoses would include ischemic colitis which is resolved versus viral gastroenteritis with associated hemorrhoidal bleeding. ordered followup labs and will schedule colonoscopy.  she does have a history of adenomatous colon polyps. Family history is  noncontributory. TLB-B12, Serum-Total ONLY (40102-V25) TLB-Ferritin (82728-FER) TLB-Folic Acid (Folate) (82746-FOL) TLB-IBC Pnl (Iron/FE;Transferrin) (83550-IBC) TLB-CRP-High Sensitivity (C-Reactive Protein) (86140-FCRP) TLB-Sedimentation Rate (ESR) (85652-ESR) Colonoscopy (Colon)  Problem # 2:  DIVERTICULOSIS, COLON (ICD-562.10) Assessment: Unchanged  high-fiber diet as tolerated with decrease caffeine use which is excessive.  Patient Instructions: 1)  Copy sent to : Dorothyann Peng, MD 2)  Please go to the basement today for your labs.  3)  Your prescription(s) have been sent to you pharmacy.  4)  Your procedure has been scheduled for 07/18/2010, please follow the seperate instructions.  5)  [facility] Patient Information Guide given to patient.  6)  Colonoscopy and Flexible Sigmoidoscopy brochure given.  7)  The medication list was reviewed and reconciled.  All changed / newly prescribed medications were explained.  A complete medication list was provided to the patient / caregiver. Prescriptions: MOVIPREP 100 GM  SOLR (PEG-KCL-NACL-NASULF-NA ASC-C) As per prep instructions.  #1 x 0   Entered by:   Harlow Mares CMA (AAMA)   Authorized by:   Mardella Layman MD [facility]   Signed by:   Harlow Mares CMA (AAMA) on 07/08/2010   Method used:   [facility]  [id number]       [address]       [place], [place]  [address]       Ph: [phone] or [phone]       Fax: [phone]   RxID:   [id number]

## 2010-07-12 NOTE — Letter (Signed)
Summary: [facility] Instructions  [facility]  [address] [place], [place] [address]   Phone: [phone]  Fax: [phone]       Deborah Stewart    1955-08-29    MRN: [id number]        Procedure Day /Date: Monday 07/18/2010     Arrival Time: 3:00pm     Procedure Time: 4:00pm     Location of Procedure:                    [facility] (4th Floor)   PREPARATION FOR COLONOSCOPY WITH MOVIPREP   Starting 5 days prior to your procedure 07/13/2010 do not eat nuts, seeds, popcorn, corn, beans, peas,  salads, or any raw vegetables.  Do not take any fiber supplements (e.g. Metamucil, Citrucel, and Benefiber).  THE DAY BEFORE YOUR PROCEDURE         Sunday 07/17/2010  1.  Drink clear liquids the entire day-NO SOLID FOOD  2.  Do not drink anything colored red or purple.  Avoid juices with pulp.  No orange juice.  3.  Drink at least 64 oz. (8 glasses) of fluid/clear liquids during the day to prevent dehydration and help the prep work efficiently.  CLEAR LIQUIDS INCLUDE: Water Jello Ice Popsicles Tea (sugar ok, no milk/cream) Powdered fruit flavored drinks Coffee (sugar ok, no milk/cream) Gatorade Juice: apple, white grape, white cranberry  Lemonade Clear bullion, consomm, broth Carbonated beverages (any kind) Strained chicken noodle soup Hard Candy                             4.  In the morning, mix first dose of MoviPrep solution:    Empty 1 Pouch A and 1 Pouch B into the disposable container    Add lukewarm drinking water to the top line of the container. Mix to dissolve    Refrigerate (mixed solution should be used within 24 hrs)  5.  Begin drinking the prep at 5:00 p.m. The MoviPrep container is divided by 4 marks.   Every 15 minutes drink the solution down to the next mark (approximately 8 oz) until the full liter is complete.   6.  Follow completed prep with 16 oz of clear liquid of your choice (Nothing red or purple).  Continue to  drink clear liquids until bedtime.  7.  Before going to bed, mix second dose of MoviPrep solution:    Empty 1 Pouch A and 1 Pouch B into the disposable container    Add lukewarm drinking water to the top line of the container. Mix to dissolve    Refrigerate  THE DAY OF YOUR PROCEDURE      Monday 07/18/2010  Beginning at 11:00am (5 hours before procedure):         1. Every 15 minutes, drink the solution down to the next mark (approx 8 oz) until the full liter is complete.  2. Follow completed prep with 16 oz. of clear liquid of your choice.    3. You may drink clear liquids until 2:00pm (2 HOURS BEFORE PROCEDURE).   MEDICATION INSTRUCTIONS  Unless otherwise instructed, you should take regular prescription medications with a small sip of water   as early as possible the morning of your procedure.          OTHER INSTRUCTIONS  You will need a responsible adult at least 55 years of age to accompany you and drive you  home.   This person must remain in the waiting room during your procedure.  Wear loose fitting clothing that is easily removed.  Leave jewelry and other valuables at home.  However, you may wish to bring a book to read or  an iPod/MP3 player to listen to music as you wait for your procedure to start.  Remove all body piercing jewelry and leave at home.  Total time from sign-in until discharge is approximately 2-3 hours.  You should go home directly after your procedure and rest.  You can resume normal activities the  day after your procedure.  The day of your procedure you should not:   Drive   Make legal decisions   Operate machinery   Drink alcohol   Return to work  You will receive specific instructions about eating, activities and medications before you leave.    The above instructions have been reviewed and explained to me by   _______________________    I fully understand and can verbalize these instructions  _____________________________ Date _________

## 2010-07-18 ENCOUNTER — Encounter: Payer: Self-pay | Admitting: Gastroenterology

## 2010-07-18 ENCOUNTER — Other Ambulatory Visit: Payer: Self-pay | Admitting: Gastroenterology

## 2010-07-18 ENCOUNTER — Other Ambulatory Visit (AMBULATORY_SURGERY_CENTER): Payer: 59 | Admitting: Gastroenterology

## 2010-07-18 DIAGNOSIS — D126 Benign neoplasm of colon, unspecified: Secondary | ICD-10-CM

## 2010-07-18 DIAGNOSIS — Z86010 Personal history of colonic polyps: Secondary | ICD-10-CM

## 2010-07-18 DIAGNOSIS — K5730 Diverticulosis of large intestine without perforation or abscess without bleeding: Secondary | ICD-10-CM

## 2010-07-18 DIAGNOSIS — K921 Melena: Secondary | ICD-10-CM

## 2010-07-18 DIAGNOSIS — K625 Hemorrhage of anus and rectum: Secondary | ICD-10-CM

## 2010-07-20 ENCOUNTER — Telehealth: Payer: Self-pay | Admitting: *Deleted

## 2010-07-20 NOTE — Telephone Encounter (Signed)
Faxed form back to pharm to advise they can dc pharm and none needed.

## 2010-07-20 NOTE — Telephone Encounter (Signed)
NO RX NEEDED

## 2010-07-20 NOTE — Telephone Encounter (Signed)
Patients insurance changed and now they will not cover Lialda it is $350.00. What drug can she change it to?

## 2010-07-26 ENCOUNTER — Encounter: Payer: Self-pay | Admitting: Gastroenterology

## 2010-07-28 NOTE — Miscellaneous (Signed)
Summary: Tramadol Rx  Clinical Lists Changes  Medications: Added new medication of TRAMADOL HCL 50 MG TABS (TRAMADOL HCL) every 6 hours as needed pain - Signed Added new medication of LEVSIN/SL 0.125 MG SUBL (HYOSCYAMINE SULFATE) use 1 tablet every 6 hrs as needed for spasms - Signed Rx of TRAMADOL HCL 50 MG TABS (TRAMADOL HCL) every 6 hours as needed pain;  #65 x 3;  Signed;  Entered by: Jennye Boroughs RN;  Authorized by: Mardella Layman MD [facility];  Method [facility]: [facility]  [address] [id number] [address] [place], [place]  [address], Ph: [phone] or [phone], Fax: [phone] Rx of LEVSIN/SL 0.125 MG SUBL (HYOSCYAMINE SULFATE) use 1 tablet every 6 hrs as needed for spasms;  #50 x 3;  Signed;  Entered by: Jennye Boroughs RN;  Authorized by: Mardella Layman MD [facility];  Method used: [facility]  [address] [id number] [address] [place], [place]  [address], Ph: [phone] or [phone], Fax: [phone]    Prescriptions: LEVSIN/SL 0.125 MG SUBL (HYOSCYAMINE SULFATE) use 1 tablet every 6 hrs as needed for spasms  #50 x 3   Entered by:   Jennye Boroughs RN   Authorized by:   Mardella Layman MD [facility]   Signed by:   Jennye Boroughs RN on 07/18/2010   Method used:   [facility]        [facility]  [address] [id number]       [address]       [place], [place]  [address]       Ph: [phone] or [phone]       Fax: [phone]   RxID:   [id number] TRAMADOL HCL 50 MG TABS (TRAMADOL HCL) every 6 hours as needed pain  #65 x 3   Entered by:   Jennye Boroughs RN   Authorized by:   Mardella Layman MD [facility]   Signed by:   Jennye Boroughs RN on 07/18/2010   Method used:   [facility]        [facility]  [address] [id number]       [address]       [place], [place]  [address]       Ph: [phone] or [phone]       Fax: [phone]   RxID:   [id number]

## 2010-07-28 NOTE — Miscellaneous (Signed)
Summary: Lialda Rx  Clinical Lists Changes  Medications: Removed medication of MOVIPREP 100 GM  SOLR (PEG-KCL-NACL-NASULF-NA ASC-C) As per prep instructions. Added new medication of LIALDA 1.2 GM TBEC (MESALAMINE) 2 capsules by mouth once daily - Signed Rx of LIALDA 1.2 GM TBEC (MESALAMINE) 2 capsules by mouth once daily;  #60 x 11;  Signed;  Entered by: Christie Nottingham CMA (AAMA);  Authorized by: Mardella Layman MD [facility];  Method used: [facility]  [address] [id number] [address] [place], [place], [place]  [address], Ph: [phone] or [phone], Fax: [phone]    Prescriptions: LIALDA 1.2 GM TBEC (MESALAMINE) 2 capsules by mouth once daily  #60 x 11   Entered by:   Christie Nottingham CMA (AAMA)   Authorized by:   Mardella Layman MD [facility]   Signed by:   Christie Nottingham CMA (AAMA) on 07/18/2010   Method used:   [facility]        [facility]  [address] [id number]       [address]       [place], [place]  [address]       Ph: [phone] or [phone]       Fax: [phone]   RxID:   [id number]

## 2010-07-28 NOTE — Procedures (Signed)
Summary: Colonoscopy  Patient: Deborah Stewart Note: All result statuses are Final unless otherwise noted.  Tests: (1) Colonoscopy (COL)   COL Colonoscopy           DONE     [facility]     [address]     [place], [place]  [address]          COLONOSCOPY PROCEDURE REPORT          PATIENT:  Deborah Stewart, Deborah Stewart  MR#:  [id number]     BIRTHDATE:  1955/09/13, 54 yrs. old  GENDER:  female     ENDOSCOPIST:  Vania Rea. Jarold Motto, MD, [facility]     REF. BY:  Dorothyann Peng, M.D.     PROCEDURE DATE:  07/18/2010     PROCEDURE:  Colonoscopy with biopsy and snare polypectomy     ASA CLASS:  Class II     INDICATIONS:  Abdominal pain, hematochezia, history of     pre-cancerous (adenomatous) colon polyps     MEDICATIONS:   Fentanyl 100 mcg IV, Versed 10 mg IV, Benadryl 25     mg IV          DESCRIPTION OF PROCEDURE:   After the risks benefits and     alternatives of the procedure were thoroughly explained, informed     consent was obtained.  Digital rectal exam was performed and     revealed no abnormalities.   The LB [id number] endoscope     was introduced through the anus and advanced to the cecum, which     was identified by both the appendix and ileocecal valve, without     limitations.  The quality of the prep was excellent, using     MoviPrep.  The instrument was then slowly withdrawn as the colon     was fully examined.     <<PROCEDUREIMAGES>>          FINDINGS:  A sessile polyp was found in the ascending colon.     DIMINUTIVE CECAL POLYP COLD SNARE EXCISED.  Severe diverticulosis     was found in the rectum and sigmoid colon. EROSIONS,INFLAMMATORY     POLYPS,STENOSIS AND RED,GRANULAR MUCOSA BIOPSIED AND COLD SNARE     EXCISION OF SEVERAL INFLAMMED POLYPS.SEE PICTURES.  This was     otherwise a normal examination of the colon.   Retroflexed views     in the rectum revealed not done.    The scope was then withdrawn     from the patient and the procedure completed.       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     RECOMMENDATIONS:     1) Await biopsy results     2) Continue current medications     SRART LIALDA 2.4 GM/DAY.OV 1 MONTH.     REPEAT EXAM:  No          ______________________________     Vania Rea. Jarold Motto, MD, [facility]          CC:          n.     eSIGNED:   Vania Rea. Patterson at 07/18/2010 04:58 PM  [place], Yin [phone]  Note: An exclamation mark (!) indicates a result that was not dispersed into the flowsheet. Document Creation Date: 07/18/2010 4:59 PM _______________________________________________________________________  (1) Order result status: Final Collection or observation date-time: 07/18/2010 16:45 Requested date-time:  Receipt date-time:  Reported date-time:  Referring Physician:   Ordering Physician: Sheryn Bison [phone]) Specimen Source:  Source: Launa Grill Order Number: [id number] Lab site:

## 2010-08-16 ENCOUNTER — Encounter: Payer: Self-pay | Admitting: Gastroenterology

## 2010-08-16 ENCOUNTER — Ambulatory Visit (INDEPENDENT_AMBULATORY_CARE_PROVIDER_SITE_OTHER): Payer: 59 | Admitting: Gastroenterology

## 2010-08-16 VITALS — BP 108/68 | HR 80 | Ht 65.0 in | Wt 177.2 lb

## 2010-08-16 DIAGNOSIS — K559 Vascular disorder of intestine, unspecified: Secondary | ICD-10-CM

## 2010-08-16 DIAGNOSIS — R0789 Other chest pain: Secondary | ICD-10-CM

## 2010-08-16 MED ORDER — ASPIRIN 81 MG PO TABS: 81.0000 mg | ORAL_TABLET | Freq: Every day | ORAL | Status: AC

## 2010-08-16 MED ORDER — MESALAMINE 1.2 G PO TBEC: 2.4000 g | DELAYED_RELEASE_TABLET | Freq: Every day | ORAL | Status: DC

## 2010-08-16 NOTE — Patient Instructions (Addendum)
We have sent you a rx for Lialda and samples.  We have referred you to Dr Jens Som with [facility] 09/02/2010 arrive at 2:15pm, If you can not keep this appt please call [phone]. Their address is [address]

## 2010-08-16 NOTE — Progress Notes (Signed)
This is a 55 year old African American female who was evaluated for rectal bleeding and found to have severe diverticulosis and segmental colitis. She also has a history of recurrent colon polyps him and to schedule for routine followup as per clinical protocol. Denies GI complaints at this time taking by mouth Lialda or 4 g a day. The patient does have atypical chest pain in the substernal area and but denies other cardiac symptomatology. However, she does smoke, has hypertension hypercholesterolemia, and has a family history of heart disease. She has not had previous cardiac evaluations. There is no history of acid reflux or known liver gallbladder disease.  Currently her bowels are moving well and she denies melena or hematochezia. She does have chronic thyroid dysfunction.  Current Medications, Allergies, Past Medical History, Past Surgical History, Family History and Social History were reviewed in [employer] record.  Pertinent Review of Systems Negative.. her chest pain is atypical and in the subxiphoid area but is associated with some shortness of breath and palpitations.   Physical Exam: Awake alert in no acute distress appears stated age. Chest is clear there are no cardiac murmurs gallops or rubs. She appears to be in a regular rhythm. Abdominal exam is unremarkable without organomegaly, masses or tenderness. Bowel sounds are normal. Peripheral extremities are unremarkable. Mental status is normal.    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 He also has marked diverticulosis, not encouraged her to continue a high fiber diet as tolerated. We will see cardiac consultation per her chest pain, and I suspect she will need exercise stress testing. I have asked her to start aspirin 81 mg a day for now. See her on a when necessary basis in [place]. as needed. No diagnosis found.

## 2010-08-30 ENCOUNTER — Encounter: Payer: Self-pay | Admitting: Cardiology

## 2010-09-02 ENCOUNTER — Ambulatory Visit: Payer: 59 | Admitting: Cardiology

## 2010-09-13 NOTE — Op Note (Signed)
NAMESHAKIMA, Deborah Stewart           ACCOUNT NO.:  [id number]   MEDICAL RECORD NO.:  [id number]          PATIENT TYPE:  AMB   LOCATION:  [facility]SC                          FACILITY:  [facility]   PHYSICIAN:  Alvan Dame, D.P.M. DATE OF BIRTH:  [birth year]   DATE OF PROCEDURE:  03/02/2008  DATE OF DISCHARGE:                               OPERATIVE REPORT   SURGEON:  Alvan Dame, DPM   PREOPERATIVE DIAGNOSIS:  Soft tissue mass skin lesion/benign neoplasm,  sub-second metatarsophalangeal area of right foot.   POSTOPERATIVE DIAGNOSIS:  Soft tissue mass skin lesion/benign neoplasm,  sub-second metatarsophalangeal area of right foot.   OPERATIVE PROCEDURE:  Excision of soft tissue lesion, skin lesion,  approximately 1 cm x 4 cm, elliptical excision be carried out for biopsy  purpose.   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  She has had it treated in the past, debrided.  The patient  has previous undergone surgical corrections of her foot with bunion and  hammertoe corrections and metatarsal osteotomies of second metatarsal on  the right foot.  She continues to have a severe keratosis, painful,  symptomatic with neurologic-type manifestations based on the continued  pain, tenderness, discomfort, and possibly some underlying soft tissue  modularity or hypertrophic scar tissue.  I cannot rule out benign  neoplasm at this time of surgery.  Proceed as scheduled.   ANESTHESIA:  IV sedation, local anesthetic administered a total of 8 mL,  50:50 mixture of 2% Xylocaine and 0.5% Marcaine plain in a regional  block fashion.   HEMOSTASIS:  Right ankle.   TOURNIQUET:  250 mmHg x14 minutes.   FINDINGS AND PROCEDURES:  The patient was brought to the OR and placed  on table in the supine position.  IV sedation was established.  Local  anesthetic was administered.  The foot was prepped  and draped in the  usual aseptic manner.  The right foot was exsanguinated with an Esmarch  wrap and ankle tourniquet was inflated to 250 mmHg.  The following  procedure was then carried out.   EXCISION OF SOFT TISSUE LESION, RIGHT FOOT.  Attention was directed to  the plantar aspect of the right foot underlying the second MTP in the  metatarsal area.  Approximately greater than 1 cm to 1.5 nucleated  keratotic lesion with hyperpigmentation was identified in this area.  At  this time, 2 similar converging incisions were made in the longitudinal  fashion.  Approximately 1 cm to 1.5 cm in width and approximately 3 cm  in total overall length encompassing an ellipse of skin and keratotic  and pigmented lesion was excised down through the subcu and fat tissue  level.  This was submitted in formalin for pathology.  The remaining  underlying tissue was noted to be free of any scar tissue or defect.  At  this time, site was lavaged with copious amounts of sterile antibiotic  solution, cleared of all soft tissue and osseous debris and closure was  accomplished utilizing a combination of 4-0 and 3-0  nylon in a simple  interrupted fashion.  Prior to completion of closure, Adaptic, Betadine,  and dry sterile dressing were applied to the right foot.  Ankle  tourniquet was deflated with immediate  return of perfusion to all toes  being noted.  The patient was returned from the OR to the recovery room  in satisfactory condition.   The patient will be maintained nonweightbearing for 2-3 weeks' duration  using crutches and Darco shoe. __________ Discharge and postop  instructions, prescriptions for pain, and antibiotic medications,  appointment for followup office visit have been made for [facility].           ______________________________  Alvan Dame, D.P.M.     RS/MEDQ  D:  03/03/2008  T:  03/04/2008  Job:  [id number]

## 2010-09-16 NOTE — Op Note (Signed)
[place]  Patient:    Deborah Stewart, Deborah Stewart             MRN: [id number] Proc. Date: 12/03/00 Adm. Date:  [id number] Attending:  Donn Pierini                           Operative Report  PREOPERATIVE DIAGNOSIS:  Right C5-6 herniated nucleus pulposus with radiculopathy.  POSTOPERATIVE DIAGNOSIS:  Right C5-6 herniated nucleus pulposus with radiculopathy.  OPERATION PERFORMED:  C5-6 anterior cervical diskectomy and fusion with allograft and anterior plating.  SURGEON:  Julio Sicks, M.D.  ASSISTANT:  Reinaldo Meeker, M.D.  ANESTHESIA:  General endotracheal.  INDICATIONS FOR PROCEDURE:  The patient is a 55 year old black female with  a history of neck and left upper extremity pain, paresthesias and weakness because of a left-sided C6 radiculopathy which has failed conservative management.  MRI scan demonstrates a large left-sided preforaminal disk herniation at C5-6 with compression of the C6 nerve root.  The patient has failed conservative management.  We have discussed options available for further care including the possibility of surgical decompression.  The patient wishes to proceed with C5-6 anterior cervical diskectomy and fusion with allograft anterior plating.  DESCRIPTION OF PROCEDURE:  The patient was taken to the operating room and placed on the operating table in supine position.  After an adequate level of anesthesia was achieved, the patient was positioned supine with the neck slightly extended and held in place with halter traction.  The patients anterior cervical region was shaved and prepped sterilely.  A 10 blade was used to make a linear incision overlying the C5-6 level.  This was carried down sharply to the platysma.  The platysma was then divided vertically and dissection proceeded along the medial border of the sternomastoid muscle and carotid sheath.  The trachea and esophagus were mobilized and  retracted towards the left.  Prevertebral fascia was stripped off the anterior spinal column.  The longus colli muscles were then elevated bilaterally using electrocautery.  Deep self-retaining retractor was placed.  Intraoperative fluoroscopy was used and the C5-6 level was confirmed.  The disk space was then incised with a 15 blade in a rectangular fashion.  A wide disk space clean-out was then achieved using pituitary rongeurs, forward and backward angled Carlens curets, Kerrison rongeurs and a high speed drill.  All elements of the disk were removed down to the posterior annulus.  Microscope was brought into the field and used throughout the remainder of the diskectomy. The remaining aspects of annulus and osteophytes were removed down to the level of the posterior longitudinal ligament using the high speed drill.  The posterior longitudinal ligament was then elevated and resected in piecemeal fashion using Kerrison rongeurs.  The underlying thecal sac was identified.  A wide central decompression was then performed using Kerrison rongeurs. Decompression was then proceeded out to the left sided C6 foramen.  Just proximal to the C6 foramen a large amount of free disk herniation was encountered.  This was dissected free and completely resected.  Decompression then proceeded out to the right sided C6 foramen.  The right-sided C nerve root was identified proximally and followed out throughout its entire course into its foramen.  There was no evidence of any residual disk herniation.  A very thorough decompression was then achieved.  Decompression then proceeded out to the right-sided C6 foramen.  The C6 nerve root was identified proximally.  A  blunt probe was passed easily along the course of the nerve root.  There was no evidence of any continued compression.  The wound was then copiously irrigated with antibiotic solution.  Gelfoam was placed topically for hemostasis which was found to be  good.  The disk space was then distracted and a 6 mm fibular wedge allograft was impacted into place and recessed approximately  mm from the anterior cortical surface.  A 23 mm Atlantis anterior cervical plate was then placed over the C5 and C6 levels.  It was then attached with two 13 mm fixed angle screws at C5 and at C6.  All four screws were given a final tightening and found to be solidly within bone. Locking screws were engaged.  Final images revealed good position of the bone grafts and hardware at the proper operative level with normal alignment of the spine.  Retraction system was removed.  Hemostasis was achieved with bipolar electrocautery.  The wound was then closed in typical fashion.  Steri-Strips and sterile dressing were applied.  There were no apparent complications. The patient tolerated the procedure well and she returned to the recovery room postoperatively. DD:  12/03/00 TD:  12/03/00 Job: [id number]

## 2010-09-16 NOTE — H&P (Signed)
NAME:  Deborah Stewart, Deborah Stewart                     ACCOUNT NO.:  [id number]   MEDICAL RECORD NO.:  [id number]                   PATIENT TYPE:  OBV   LOCATION:  NA                                   FACILITY:  [facility]   PHYSICIAN:  Hal Morales, M.D.             DATE OF BIRTH:  [birth year]   DATE OF ADMISSION:  DATE OF DISCHARGE:                                HISTORY & PHYSICAL   HISTORY OF PRESENT ILLNESS:  Ms. Stohr is a 55 year old divorced African-  American female para 1-0-0-1 who presents for hysterectomy because of  chronic pelvic pain and endometriosis.  For the past 7 years the patient has  experienced constant 10/10 on a 10-point scale pelvic cramping 1 week prior  to and during her menstrual flow.  This pain has been relieved only to a  level of 5/10 on a 10-poitn scale with narcotic analgesia.  Being a smoker,  the patient was not a candidate for oral contraceptives and received only  minimal relief when she was prescribed Depo-Provera (the pain decreased from  10/10 to 4/10) which she used for approximately 2 years.  For the past 8  months the patient has found relief with Lupron but complains of severe  vasomotor symptoms with occasional breakthrough pain.  Both of these side  effects were somewhat improved with the addition of Aygestin and herbal  supplements.  During the course of using Lupron the patient has had only one  menstrual period which occurred during a time in which she was awaiting her  second dose of Lupron.  That flow only lasted for 7 days and required  between four to seven pads per day.  She denies any urinary tract symptoms,  constipation, nausea, vomiting, diarrhea, vaginitis symptoms, or  dyspareunia.  During a laparoscopy in 2001 for tubal cautery it was observed  that the patient had a uterus which was upper limits of normal size, a  clubbed right fallopian tube, and fine adhesions in the posterior cul-de-  sac.  At that time there was no visible  evidence of endometriosis.  A  discussion was held with the patient regarding the medical and surgical  options available for management of her chronic pelvic pain.  Considering  the patient's experience with various therapies she has decided to pursue  definitive therapy in the form of hysterectomy.   PAST MEDICAL HISTORY:  1. OB history:  Gravida 1 para 1-0-0-1.  The patient had no problems during     her pregnancy.  2. GYN history:  Menarche at 55 years old.  The patient had regular     menstrual periods prior to hormonal therapy for pelvic pain.  She uses     bilateral tubal ligation as her method of contraception.  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  She had an abnormal Pap smear in the past which was     treated in 1999 with  cryotherapy and again in 2002.  The patient's last     normal Pap smear was December 2004.  Last normal mammogram was March     2005.  3. Medical history:  Positive for bilateral breast cysts, hypothyroidism,     migraines, scoliosis, GERD, and mildly-elevated cholesterol.  4. Surgical history:  In 1989, left foot surgery.  Between 1993 to 2003 five     surgeries on her right foot.  In 1993, left breast excision     (fibroadenoma).  In 2001, laparoscopic tubal cautery.  In 2002, cervical     spine surgery for herniation of C5 and 6.  In 2004, facial cystectomy.   The patient denies any problems with anesthesia and has no history of blood  transfusion.   FAMILY HISTORY:  Positive for hypertension, diabetes mellitus, heart  disease, lung cancer, breast cancer (sister premenopausal), asthma, thyroid  disease, and arthritis.   SOCIAL HISTORY:  The patient is divorced and she works as a Systems analyst.   HABITS:  She smokes one pack of cigarettes per day.  She occasionally  consumes alcohol socially.   CURRENT MEDICATIONS:  1. Synthroid 88 mcg per day.  2. Nexium 40 mg per day.  3. Healthy Woman (soy supplements) two tablets daily.  4. Vitamin E  400 international units daily.  5. Calcium 1200 mg daily.  6. Fish oil supplement one daily.  7. One-A-Day Weight Smart vitamins one tablet daily.   ALLERGIES:  The patient is allergic to CODEINE which causes her hives and  severe headache.   REVIEW OF SYSTEMS:  The patient has had a productive cough, left upper chest  pain, shortness of breath, and headache.  Otherwise, negative except as  mentioned in history of present illness (the patient has been cleared for  surgery by her primary care physician, Dr. Leslee Home).   PHYSICAL EXAMINATION:  VITAL SIGNS:  Blood pressure 126/80, weight is 165,  height is 5 feet 5 inches tall.  NECK:  Supple without masses, adenopathy, or thyromegaly.  HEART:  Regular rate and rhythm.  There is no murmur.  LUNGS:  Clear.  There are no wheezes, rales, or rhonchi.  BACK:  No CVA tenderness.  ABDOMEN:  Bowel sounds are present.  It is soft without tenderness,  guarding, rebound, or organomegaly.  EXTREMITIES:  Without clubbing, cyanosis, or edema.  PELVIC:  EG/BUS is within normal limits.  The vagina is rugous.  Cervix is  nontender without lesions.  Uterus upper limits of normal size, retroverted,  without tenderness.  Adnexa without tenderness or masses.  Rectovaginal exam  without tenderness or masses.   IMPRESSION:  1. Chronic pelvic pain.  2. Endometriosis.   DISPOSITION:  A discussion was held with the patient regarding both medical  and surgical options for management of her presenting symptoms and she has  decided for definitive therapy in the form of hysterectomy.  The patient  understands the implications for her procedure along with its risks which  include but are not limited to reaction to anesthesia, damage to adjacent  organs, infection, excessive bleeding, and the loss of ability for  pregnancy.  The patient was given a copy of the ACOG brochure entitled Understanding Hysterectomy.  She has consented to undergo a laparoscopy   with a total vaginal hysterectomy with the possibility of a total abdominal  hysterectomy at [facility] on July 28, 2003 at 7:30  a.m.     Elmira J. Powell, P.A.  Hal Morales, M.D.    EJP/MEDQ  D:  07/23/2003  T:  07/23/2003  Job:  [id number]

## 2010-09-16 NOTE — Op Note (Signed)
[facility]  Patient:    Deborah Stewart, Deborah Stewart             MRN: [id number] Proc. Date: 11/09/99 Adm. Date:  [id number] Attending:  Dierdre Forth Pearline                           Operative Report  PREOPERATIVE DIAGNOSIS:       Desire for surgical sterilization.  POSTOPERATIVE DIAGNOSES:      Desire for surgical sterilization, right hydrosalpinx and pelvic adhesions.  SURGEON:                      Vanessa P. Pennie Rushing, M.D.  ANESTHESIA:                   General orotracheal.  ESTIMATED BLOOD LOSS:         Less than 10 cc.  COMPLICATIONS:                None.  SURGERY:                      Laparoscopic tubal cautery.  FINDINGS:                     The uterus was upper limits of normal size.  The right fallopian tube was normal in its length; however, the fimbriated end was clubbed but freely mobile.  The left fallopian tube appeared completely normal without adhesions or evidence of endometriosis.  The posterior cul-de-sac had some very fine thin adhesions.  DESCRIPTION OF PROCEDURE:     Patient was taken to the operating room after appropriate identification and placed on the operating table.  After the attainment of adequate general anesthesia, she was placed in the modified lithotomy position.  The abdomen, perineum and vagina were prepped with multiple layers of Betadine.  A red Robinson catheter was used to empty the bladder.  A single-tooth tenaculum was placed on the anterior cervix.  The abdomen and perineum were draped as a sterile field.  The subumbilical area was infiltrated with a total of 6 cc of 0.25% Marcaine.  A subumbilical incision was made and the Veress cannula placed through that incision into the peritoneal cavity.  A pneumoperitoneum was created with 3 L of CO2.  The Veress cannula was removed and the laparoscopic trocar placed through that incision into the peritoneal cavity.  A laparoscope was placed through  the trocar sleeve.  The operating channel of the laparoscope was used to insert the cautery mechanism and the right fallopian tube was identified, followed to its fimbriated clubbed end and cauterized in two adjacent areas.  Just prior to this, 2 g of Cefotan were administered intravenously.  A similar procedure was carried out on the opposite side with identification of the tube, following it to its fimbriated end, then cauterizing in two adjacent areas. Hemostasis was noted to be adequate and all instruments were removed from the peritoneal cavity under direct visualization as the CO2 was allowed to escape. The subumbilical incision was closed with a subcuticular suture of 3-0 Vicryl. A sterile dressing was applied and the single-tooth tenaculum removed.  Silver nitrate was used to cauterize the entry sites of the tenaculum to achieve adequate hemostasis.  The patient was then awakened from general anesthesia and taken to the recovery room in satisfactory condition, having tolerated the procedure well, with sponge and instrument counts correct.  DD:  11/09/99 TD:  11/09/99 Job: [id number]

## 2010-09-16 NOTE — Op Note (Signed)
NAME:  Deborah Stewart, Deborah Stewart                     ACCOUNT NO.:  [id number]   MEDICAL RECORD NO.:  [id number]                   PATIENT TYPE:  OBV   LOCATION:  9320                                 FACILITY:  [facility]   PHYSICIAN:  Hal Morales, M.D.             DATE OF BIRTH:  1956/02/05   DATE OF PROCEDURE:  07/28/2003  DATE OF DISCHARGE:                                 OPERATIVE REPORT   PREOPERATIVE DIAGNOSES:  1. Chronic pelvic pain.  2. Endometriosis.   POSTOPERATIVE DIAGNOSES:  1. Chronic pelvic pain.  2. Endometriosis.  3. Extensive pelvic adhesions.  4. Bilateral ovarian dermoid cyst.   OPERATION:  Diagnostic laparoscopy, exploratory laparotomy, total abdominal  hysterectomy, bilateral salpingectomies, lysis of adhesions, bilateral  ovarian cystectomy.   SURGEON:  Hal Morales, M.D.   FIRST ASSISTANT:  Elmira J. Powell, P.A.C.   ANESTHESIA:  General orotracheal.   COMPLICATIONS:  None.   FINDINGS:  The uterus was normal size with adhesions primarily to the  posterior cul-de-sac and the sigmoid colon.  The ovaries bilaterally were  likewise adherent to the posterior uterus.  The left ovary was enlarged to 6  cm and was adherent to the posterior uterus.  The right ovary was enlarged  to 6 cm and densely adherent to the right pelvic side wall and to the  posterior uterus.  The tubes were status post tubal interruption for  sterilization.   DESCRIPTION OF PROCEDURE:  The patient was taken to the operating room after  appropriate identification and placed on the operating table.  After the  attainment of adequate general anesthesia, she was placed in the modified  lithotomy position.  The abdomen, perineum and vagina were prepped with  multiple layers of Betadine and a Foley catheter inserted into the bladder  and connected to straight drainage.  A Hulka tenaculum was placed on the  anterior cervix.  The abdomen and perineum were draped as a sterile field.  Subumbilical and suprapubic injection of a total of 15 mL of 0.25% Marcaine  was undertaken.  A subumbilical incision was made and the Veress cannula  placed through that incision into the peritoneal cavity.  A pneumoperitoneum  was created with 2.5 liters of CO2.  The Veress cannula was removed and the  laparoscopic trocar placed through the subumbilical incision into the  peritoneal cavity.  The laparoscope was placed through the trocar sleeve.  A  suprapubic incision was made to the left of midline and the laparoscopic  probe trocar placed through that incision into the peritoneal cavity under  direct visualization.  The above noted findings were made and documented and  the decision made to proceed with total abdominal hysterectomy.  The  laparoscopic instruments were removed under direct visualization and the  subumbilical incision was closed first with a fascial suture of 0 Vicryl  then with a subcuticular suture of 3-0 Vicryl.  The suprapubic incision was  incorporated into the  transverse incision for laparotomy.  That incision was  made and the abdomen opened in layers.  The peritoneum was entered. A self-  retaining O'Connor-O'Sullivan retractor was placed in the peritoneal cavity.  The uterus was grasped at either cornual region with [facility] clamps.  Lysis of  adhesions was carried out to free the uterus posteriorly from the bowel and  the ovaries and the uterus was further elevated into the operative field.  The left round ligament was identified, suture ligated and incised with that  incision taken anteriorly on the anterior leaf of the broad ligament.  The  utero-ovarian ligament was isolated with a combination of blunt and sharp  dissection, clamped, cut and tied with a free tie and suture ligated.  A  similar procedure was carried out on the right side with the round ligament  and the utero-ovarian ligaments.  The bladder was dissected off the anterior  cervix.  The uterus was  further dissected off the posterior cul-de-sac  peritoneum and the sigmoid colon.  The uterine arteries on the right and  left side were isolated, clamped, cut, and suture ligated.  Further  dissection allowed identification of the paracervical tissues which were  successfully clamped, cut, and suture ligated down to the level of the  uterosacral ligaments which were clamped, cut, and suture ligated and those  sutures held.  The vaginal angles were then clamped, cut, and suture ligated  and those sutures held.  The remainder of the uterus was removed from the  operative field.  The vaginal cuff was closed with figure-of-eight sutures  of 0 Vicryl.  Copious irrigation was carried out and hemostatic sutures  placed at the right and left vaginal angle areas to achieve adequate  hemostasis.  The left ovary was then dissected away from the sidewall  peritoneum and elevated to reveal the cystic structure on the antimesenteric  side.  Initially, approximately 10 mL of cloudy fluid was removed from the  cyst and the cyst wall dissected off the remaining ovarian cortex.  A second  lobule of the cyst was then identified and likewise dissected off the  underlying ovarian cortex.  It was apparent from the contents of the second  cyst which were sebaceous material in here that this was indeed a dermoid.  Once the entire cyst wall had been dissected out and removed, the ovarian  cortex edges were oversewn with a running interlocking suture of 0 Vicryl to  allow for hemostasis.  The right ovary was then dissected off the right  pelvic sidewall being careful to identify the ureter in that dissection.  Once that ovary could be elevated into the operative field and was  completely free from the right pelvic sidewall, it was apparent that a  dermoid cyst was on the antimesenteric side of the ovary as well.  The  ovarian cortex was incised and that cyst wall dissected out with a combination of hydrodissection,  blunt and sharp dissection.  It was removed  from the operative field and the ovarian cortex edges cauterized to allow  for adequate hemostasis.  Copious irrigation was carried out.  At this  point, the sutures that had been held on the vaginal angles and on the  uterosacral ligaments were tied together on either side and hemostatic  sutures placed near the utero-ovarian ligament pedicles to achieve adequate  hemostasis.  Copious irrigation was again carried out to remove all of the  dermoid cyst contents.  Each ovary was wrapped in Intercede.  At this time,  all instruments were removed from the peritoneal cavity and the abdominal  peritoneum closed with a running suture of 2-0 Vicryl.  The rectus fascia  was made hemostatic with Bovie cautery then closed with running sutures from  each apex to the midline.  The subcutaneous tissue was copiously irrigated  and made hemostatic with Bovie cautery and skin staples applied to the skin  incisions.  A sterile dressing was applied and the patient was taken from  the operating room to the recovery room in satisfactory condition having  tolerated the procedure well with sponge and instrument counts correct.   SPECIMENS TO PATHOLOGY:  1. Uterus, cervix, and both fallopian tubes.  2. Left ovarian cyst.  3. Right ovarian cyst.   ESTIMATED BLOOD LOSS:  225 mL.                                               Hal Morales, M.D.    VPH/MEDQ  D:  07/28/2003  T:  07/28/2003  Job:  [id number]

## 2010-09-16 NOTE — Op Note (Signed)
   NAME:  Deborah Stewart, Deborah Stewart                     ACCOUNT NO.:  [id number]   MEDICAL RECORD NO.:  [id number]                   PATIENT TYPE:  AMB   LOCATION:  DSC                                  FACILITY:  [facility]   PHYSICIAN:  Alfredia Ferguson, M.D.               DATE OF BIRTH:  January 21, 1956   DATE OF PROCEDURE:  03/12/2003  DATE OF DISCHARGE:                                 OPERATIVE REPORT   PREOPERATIVE DIAGNOSIS:  A 1 cm sebaceous cyst, right anterior chin.   POSTOPERATIVE DIAGNOSIS:  A 1 cm sebaceous cyst, right anterior chin.   OPERATION PERFORMED:  Elliptical excision of sebaceous cyst, right anterior  chin.   SURGEON:  Alfredia Ferguson, M.D.   ANESTHESIA:  2% Xylocaine 1:100,000 epinephrine.   INDICATIONS FOR PROCEDURE:  This is a 54 year old woman who previously had  an infected sebaceous cyst on the right chin.  We had allowed the infection  to come under control.  She now is left with about a 1 cm subcutaneous mass  which is clinically a sebaceous cyst.  She understands that if I remove it,  she will be trading what she had for a permanent and potentially unsightly  scar.  In spite of that, she wishes to proceed with surgery.   DESCRIPTION OF PROCEDURE:  Skin markers were placed in elliptical fashion  around the lesion to include the punctum which was a part of the cyst.  Local anesthesia was infiltrated and the right chin was prepped with  Betadine and draped with sterile drapes.  After waiting approximately 10  minutes, an elliptical incision of the skin was carried out.  Using sharp  dissection, I dissected around the cyst wall.  The entire cyst was removed  without getting into the cyst wall.  Hemostasis was accomplished using  pressure.  The wound was closed by approximating the dermis using  interrupted 5-0 Monocryl suture.  The skin was united using a running 6-0  nylon suture.  The area was cleansed and a light dressing was applied.                      Alfredia Ferguson, M.D.    WBB/MEDQ  D:  03/12/2003  T:  03/12/2003  Job:  [id number]

## 2010-09-16 NOTE — Discharge Summary (Signed)
NAME:  JET, TRAYNHAM                     ACCOUNT NO.:  [id number]   MEDICAL RECORD NO.:  [id number]                   PATIENT TYPE:  INP   LOCATION:  9320                                 FACILITY:  [facility]   PHYSICIAN:  Hal Morales, M.D.             DATE OF BIRTH:  04/17/1956   DATE OF ADMISSION:  07/28/2003  DATE OF DISCHARGE:  07/30/2003                                 DISCHARGE SUMMARY   DISCHARGE DIAGNOSES:  1. Chronic pelvic pain.  2. Endometriosis.  3. Pelvic adhesions.  4. Bilateral ovarian dermoid cysts.   OPERATION:  On the date of admission patient underwent exploratory  laparotomy with a diagnostic laparoscopy, total abdominal hysterectomy,  bilateral salpingectomies, lysis of adhesions and bilateral ovarian  cystectomies tolerating all procedures well.  Patient was found to have a  normal-size uterus, bilateral tubes which had been previously cauterized,  ovaries enlarged bilaterally with dermoid cysts (each ovary measured  approximately 6 cm) along with multiple pelvic adhesions.   HISTORY OF PRESENT ILLNESS:  Ms. Anyra Kaufman is a 55 year old divorced  African-American female para 1-0-0-1 who presents for hysterectomy because  of chronic pelvic pain and endometriosis.  Please see patient's dictated  history and physical examinations for details.   PREOPERATIVE PHYSICAL EXAMINATION:  VITAL SIGNS:  Blood pressure 126/80,  weight is 165, height is 5 feet 5 inches tall.  GENERAL:  Exam is within normal limits.  PELVIC:  EGBUS is within normal limits; vagina is rugose; cervix is  nontender without lesions; uterus is upper limits of normal size,  retroverted without tenderness; adnexa was without tenderness or masses.  Rectovaginal exam was without tenderness or masses.   HOSPITAL COURSE:  On the date of admission patient underwent aforementioned  procedures tolerating them all well.  Postoperative course was unremarkable  with postop hemoglobin being 13  (preop hemoglobin was 16).  By postop day #2  patient had received the maximum benefit of her hospital stay and was  therefore deemed ready for discharge home.   DISCHARGE MEDICATIONS:  1. Phenergan 25 mg one tablet every 6 hours as needed for nausea.  2. Ibuprofen 600 mg one tablet with food every 6 hours for 3 days then as     needed for pain.  3. Colace 100 mg one tablet twice daily until bowel movements are regular.  4. Tylox one to two tablets every 4-6 hours as needed for pain.  5. Patient was advised to resume her prehospital medications.   FOLLOW UP:  Patient is scheduled for abdominal staple removal at [facility] on August 03, 2003 at 10 a.m.  She has a 6 weeks postoperative  visit with Dr. Dierdre Forth on Sep 08, 2003 at 3 p.m.   DISCHARGE INSTRUCTIONS:  Patient was given a copy of [address] [facility]  postoperative instruction sheet.  She was further advised to avoid driving  for 2 weeks, heavy lifting for 4 weeks and  intercourse for 6 weeks.  Patient's diet was without restriction.   FINAL PATHOLOGY:  Uterus, cervix, right and left fallopian tubes:  Serosal  surfaces:  Fibrous adhesions, cervix no pathologic diagnosis, endometrium  weakly secretory endometrium no evidence of hyperplasia or malignancy,  myometrium:  Leiomyoma measured 0.6 cm, bilateral fallopian tube segments:  No pathologic diagnosis.  Cystectomy left ovarian cyst benign cystic  teratoma.  Cystectomy right ovarian cyst wall benign cystic teratoma (no  immature elements were identified in either cystic teratoma).     [place] J. Adline Peals.                    Hal Morales, M.D.    EJP/MEDQ  D:  08/18/2003  T:  08/19/2003  Job:  [id number]

## 2010-09-16 NOTE — Op Note (Signed)
   NAME:  Deborah Stewart, Deborah Stewart                          ACCOUNT NO.:  [id number]   MEDICAL RECORD NO.:  [id number]                   PATIENT TYPE:  AMB   LOCATION:  ENDO                                 FACILITY:  [facility]   PHYSICIAN:  Anselmo Rod, M.D.               DATE OF BIRTH:  [birth year]   DATE OF PROCEDURE:  11/07/2002  DATE OF DISCHARGE:                                 OPERATIVE REPORT   PROCEDURE:  Colonoscopy with hot biopsies x 4.   ENDOSCOPIST:  Charna Elizabeth, M.D.   INSTRUMENT USED:  Olympus video colonoscope.   INDICATIONS FOR PROCEDURE:  55 year old [place] American female with rectal  bleeding.  No known family history of colon cancer but history of breast  cancer in her sisters.  Rule out colonic polyps, masses, etc.   PREPROCEDURE PREPARATION:  Informed consent was obtained from the patient.  The patient was fasted for eight hours prior to the procedure and prepped  with a bottle of magnesium citrate and a gallon of GoLYTELY the night prior  to the procedure.   PREPROCEDURE PHYSICAL:  Patient with stable vital signs.  Neck supple.  Chest clear to auscultation.  S1 and S2 regular.  Abdomen soft with normal  bowel sounds.   DESCRIPTION OF PROCEDURE:  The patient was placed in the left lateral  decubitus position, sedated with 70 mg of Demerol and 7 mg Versed  intravenously.  Once the patient was adequately sedated, maintained on low  flow oxygen, continuous cardiac monitoring, the Olympus video colonoscope  was advanced into the rectum to the cecum without difficulty.  The patient  had five small sessile polyps that were removed with hot biopsy forceps from  the right colon.  The rest of the mucosa to the sigmoid appeared normal.  The appendiceal orifice and ileocecal valve were clearly visualized and  photographed.  There was no evidence of diverticulosis.  The patient  tolerated the procedure well without complications.   IMPRESSION:  1. Five small sessile polyps  biopsied.  2. No evidence of hemorrhoids seen on retroflexion of the rectum.   RECOMMENDATIONS:  1. Await pathology results.  2.     High fiber diet with liberal fluid intake.  3. Avoid non-steroidals including aspirin for the next four weeks.  4. Outpatient follow up in the next two weeks, sooner if needed.                                               Anselmo Rod, M.D.    JNM/MEDQ  D:  11/07/2002  T:  11/07/2002  Job:  [id number]   cc:   Reuben Likes, M.D.  [address]  [place]  [place] [address]  Fax: [phone]

## 2010-09-20 ENCOUNTER — Ambulatory Visit (INDEPENDENT_AMBULATORY_CARE_PROVIDER_SITE_OTHER): Payer: 59 | Admitting: Cardiology

## 2010-09-20 ENCOUNTER — Encounter: Payer: Self-pay | Admitting: Cardiology

## 2010-09-20 DIAGNOSIS — R071 Chest pain on breathing: Secondary | ICD-10-CM | POA: Insufficient documentation

## 2010-09-20 DIAGNOSIS — I10 Essential (primary) hypertension: Secondary | ICD-10-CM

## 2010-09-20 DIAGNOSIS — Z720 Tobacco use: Secondary | ICD-10-CM

## 2010-09-20 DIAGNOSIS — Z87891 Personal history of nicotine dependence: Secondary | ICD-10-CM | POA: Insufficient documentation

## 2010-09-20 DIAGNOSIS — E785 Hyperlipidemia, unspecified: Secondary | ICD-10-CM

## 2010-09-20 DIAGNOSIS — F17200 Nicotine dependence, unspecified, uncomplicated: Secondary | ICD-10-CM

## 2010-09-20 DIAGNOSIS — R072 Precordial pain: Secondary | ICD-10-CM

## 2010-09-20 DIAGNOSIS — R079 Chest pain, unspecified: Secondary | ICD-10-CM | POA: Insufficient documentation

## 2010-09-20 HISTORY — DX: Chest pain on breathing: R07.1

## 2010-09-20 HISTORY — DX: Tobacco use: Z72.0

## 2010-09-20 NOTE — Assessment & Plan Note (Signed)
Patient counseled on discontinuing for between 3-10 minutes. 

## 2010-09-20 NOTE — Assessment & Plan Note (Signed)
Blood pressure controlled. Continue present medications. 

## 2010-09-20 NOTE — Assessment & Plan Note (Signed)
Continue statin. Managed by primary care. 

## 2010-09-20 NOTE — Patient Instructions (Signed)
Your physician has requested that you have a stress echocardiogram. For further information please visit [email or web address]. Please follow instruction sheet as given.  Your physician recommends that you schedule a follow-up appointment in:  As needed with Dr. Crenshaw  

## 2010-09-20 NOTE — Assessment & Plan Note (Signed)
Symptoms atypical but multiple risk factors. Schedule stress echocardiogram.

## 2010-09-20 NOTE — Progress Notes (Signed)
HPI: 55 year old female with no prior cardiac history for evaluation of chest pain. Patient states that she has had intermittent chest pain for the past 5 years. It is substernal with radiation to the back. It is described as a sharp pain. It increases with inspiration. It lasts 2 minutes and resolves spontaneously. There is no associated nausea, diaphoresis or shortness of breath. She has some dyspnea on exertion but no orthopnea, PND, pedal edema, syncope or exertional chest pain. The cause of her chest pain we were asked to further evaluate.  Current Outpatient Prescriptions  Medication Sig Dispense Refill  . aspirin 81 MG tablet Take 1 tablet (81 mg total) by mouth daily.  30 tablet  0  . atorvastatin (LIPITOR) 10 MG tablet Take 10 mg by mouth daily.        . Calcium Carbonate-Vitamin D (CALCIUM-VITAMIN D) 600-200 MG-UNIT CAPS Take 1 capsule by mouth daily.        . hydrochlorothiazide (,MICROZIDE/HYDRODIURIL,) 12.5 MG capsule Take 12.5 mg by mouth daily.        . hyoscyamine (LEVSIN SL) 0.125 MG SL tablet Place 0.125 mg under the tongue every 6 (six) hours as needed.        Marland Kitchen levothyroxine (SYNTHROID, LEVOTHROID) 100 MCG tablet 1 mcg. Take 2 tablets on Mon, Wed, Fri and one tablet on the other days.      . mesalamine (LIALDA) 1.2 G EC tablet Take 2 tablets (2.4 g total) by mouth daily with breakfast.  90 tablet  6  . Multiple Vitamin (MULTIVITAMIN) capsule Take 1 capsule by mouth daily.        . Omega-3 Fatty Acids (FISH OIL) 1000 MG CAPS Take 2 capsules by mouth daily.        . traMADol (ULTRAM) 50 MG tablet Take 50 mg by mouth every 6 (six) hours as needed.          Allergies  Allergen Reactions  . Codeine     REACTION: nausea and hives    Past Medical History  Diagnosis Date  . Unspecified hypothyroidism   . Personal history of colonic polyps 06/2010    hyperplastic  . Diverticulosis of colon (without mention of hemorrhage)   . Hypertension   . Hyperlipidemia   . Ulcerative  colitis     Past Surgical History  Procedure Date  . Abdominal hysterectomy   . Foot surgery   . Lumbar disc surgery     L4 & L5  . Thyroidectomy, partial     History   Social History  . Marital Status: Divorced    Spouse Name: N/A    Number of Children: 1  . Years of Education: N/A   Occupational History  . filed Cytogeneticist    Social History Main Topics  . Smoking status: Current Everyday Smoker -- 1.0 packs/day for 35 years    Types: Cigarettes  . Smokeless tobacco: Never Used  . Alcohol Use: Yes     Occasional  . Drug Use: No  . Sexually Active: Not on file   Other Topics Concern  . Not on file   Social History Narrative  . No narrative on file    Family History  Problem Relation Age of Onset  . Breast cancer Sister   . Lung cancer      uncle  . Diabetes Mother   . Breast cancer      great aunt  . Diabetes Father   . Diabetes      grandparents  .  Heart disease      grandfather    ROS: no fevers or chills, productive cough, hemoptysis, dysphasia, odynophagia, melena, hematochezia, dysuria, hematuria, rash, seizure activity, orthopnea, PND, pedal edema, claudication. Remaining systems are negative.  Physical Exam: General:  Well developed/well nourished in NAD Skin warm/dry Patient not depressed No peripheral clubbing Back-normal HEENT-normal/normal eyelids Neck supple/normal carotid upstroke bilaterally; no bruits; no JVD; no thyromegaly chest - CTA/ normal expansion CV - RRR/normal S1 and S2; no murmurs, rubs or gallops;  PMI nondisplaced Abdomen -NT/ND, no HSM, no mass, + bowel sounds, no bruit 2+ femoral pulses, no bruits Ext-no edema, chords, 2+ DP Neuro-grossly nonfocal  ECG Normal sinus rhythm at a rate of 88. Axis normal. Left atrial enlargement. Nonspecific ST changes.

## 2010-10-11 ENCOUNTER — Ambulatory Visit (HOSPITAL_COMMUNITY): Payer: 59 | Attending: Cardiology | Admitting: Radiology

## 2010-10-11 ENCOUNTER — Ambulatory Visit (HOSPITAL_BASED_OUTPATIENT_CLINIC_OR_DEPARTMENT_OTHER): Payer: 59 | Admitting: Radiology

## 2010-10-11 DIAGNOSIS — R072 Precordial pain: Secondary | ICD-10-CM

## 2010-10-11 DIAGNOSIS — R0609 Other forms of dyspnea: Secondary | ICD-10-CM | POA: Insufficient documentation

## 2010-10-11 DIAGNOSIS — Z8249 Family history of ischemic heart disease and other diseases of the circulatory system: Secondary | ICD-10-CM | POA: Insufficient documentation

## 2010-10-11 DIAGNOSIS — Q780 Osteogenesis imperfecta: Secondary | ICD-10-CM

## 2010-10-11 DIAGNOSIS — I10 Essential (primary) hypertension: Secondary | ICD-10-CM | POA: Insufficient documentation

## 2010-10-11 DIAGNOSIS — F17200 Nicotine dependence, unspecified, uncomplicated: Secondary | ICD-10-CM | POA: Insufficient documentation

## 2010-10-11 DIAGNOSIS — M549 Dorsalgia, unspecified: Secondary | ICD-10-CM | POA: Insufficient documentation

## 2010-10-11 DIAGNOSIS — R0989 Other specified symptoms and signs involving the circulatory and respiratory systems: Secondary | ICD-10-CM | POA: Insufficient documentation

## 2010-10-11 DIAGNOSIS — E785 Hyperlipidemia, unspecified: Secondary | ICD-10-CM | POA: Insufficient documentation

## 2010-10-11 MED ORDER — PERFLUTREN PROTEIN A MICROSPH IV SUSP
3.0000 mL | Freq: Once | INTRAVENOUS | Status: AC
  Administered 2010-10-11: 3 mL via INTRAVENOUS

## 2010-10-12 ENCOUNTER — Encounter (HOSPITAL_COMMUNITY): Payer: Self-pay | Admitting: Internal Medicine

## 2010-12-20 ENCOUNTER — Encounter: Payer: Self-pay | Admitting: Internal Medicine

## 2010-12-20 ENCOUNTER — Ambulatory Visit (INDEPENDENT_AMBULATORY_CARE_PROVIDER_SITE_OTHER): Payer: 59 | Admitting: Internal Medicine

## 2010-12-20 DIAGNOSIS — R079 Chest pain, unspecified: Secondary | ICD-10-CM

## 2010-12-20 DIAGNOSIS — E785 Hyperlipidemia, unspecified: Secondary | ICD-10-CM

## 2010-12-20 DIAGNOSIS — Z720 Tobacco use: Secondary | ICD-10-CM

## 2010-12-20 DIAGNOSIS — I10 Essential (primary) hypertension: Secondary | ICD-10-CM

## 2010-12-20 DIAGNOSIS — F17200 Nicotine dependence, unspecified, uncomplicated: Secondary | ICD-10-CM

## 2010-12-20 MED ORDER — HYDROCHLOROTHIAZIDE 12.5 MG PO CAPS: 12.5000 mg | ORAL_CAPSULE | Freq: Every day | ORAL | Status: DC

## 2010-12-20 MED ORDER — ATORVASTATIN CALCIUM 10 MG PO TABS: 10.0000 mg | ORAL_TABLET | Freq: Every day | ORAL | Status: DC

## 2010-12-20 NOTE — Progress Notes (Signed)
Subjective:    Patient ID: Deborah Stewart, female    DOB: [birth year], 55 y.o.   MRN: [id number]  HPI Deborah Stewart presents to establish for continuity care. She had a health screening which revealed mildly elevated LDL @ 144 and HDL low at 28. She does take lipitor irregularly. She also takes BP meds and Lialda for UC. The later is a new diagnosis. She in the main is medically stable.   Past Medical History  Diagnosis Date  . Unspecified hypothyroidism   . Personal history of colonic polyps 06/2010    hyperplastic  . Diverticulosis of colon (without mention of hemorrhage)   . Hypertension   . Hyperlipidemia   . Ulcerative colitis   . Thyroid nodule   . DDD (degenerative disc disease), lumbar     by MRI  . DDD (degenerative disc disease), cervical     s/p diskectomy    Past Surgical History  Procedure Date  . Foot surgery     callus surgery both feet.  Dr. Ria Bush, DPO  . Thyroidectomy, partial Feb '11  . Cervical disc surgery 2004    Dr. Dutch Quint  . Total abdominal hysterectomy w/ bilateral salpingoophorectomy    Family History  Problem Relation Age of Onset  . Breast cancer Sister   . Cancer Sister     breast  . Lung cancer      uncle  . Diabetes Mother   . Breast cancer      great aunt  . Diabetes Father   . Heart disease Father     CHF  . Diabetes      grandparents  . Heart disease      grandfather  . Diabetes Brother   . Heart disease Brother   . Hypertension Brother   . Hyperlipidemia Brother   . Hypertension Brother    History   Social History  . Marital Status: Divorced    Spouse Name: N/A    Number of Children: 1  . Years of Education: 12   Occupational History  . filed Cytogeneticist    Social History Main Topics  . Smoking status: Current Everyday Smoker -- 1.0 packs/day for 35 years    Types: Cigarettes  . Smokeless tobacco: Never Used  . Alcohol Use: 2.5 oz/week    5 drink(s) per week     Occasional  . Drug Use: No  .  Sexually Active: Yes -- Female partner(s)   Other Topics Concern  . Not on file   Social History Narrative   [employer], [employer] -BS social work. Married 1980 - 16 yrs/divorced. 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 Lives alone. Dating - long term relationship/[place]. Unprotected. No history of physical or sexual abuse.        Review of Systems Review of Systems  Constitutional:  Negative for fever, chills, activity change and unexpected weight change.  HEENT:  Negative for hearing loss, ear pain, positive for tinitis. Negative congestion, neck stiffness and postnasal drip. Negative for sore throat or swallowing problems. Negative for dental complaints.   Eyes: Negative for vision loss or change in visual acuity.  Respiratory: Negative for chest tightness and wheezing.   Cardiovascular: Occasional chest pain, had stress test that was normal. NO  palpitations. No decreased exercise tolerance Gastrointestinal: changes associated with UC. Genitourinary: Negative for urgency, frequency, flank pain and difficulty urinating.  Musculoskeletal: Negative for myalgias, back pain or gait problem. Does have knee pain, decreased ability to  squat.  Paresthesias right hand Neurological: Negative for dizziness, tremors, weakness and headaches.  Hematological: Negative for adenopathy.  Psychiatric/Behavioral: Negative for behavioral problems and dysphoric mood.  Derm - normal      Objective:   Physical Exam Vitals reviewed - normal except for weight Gen'l - Well nourished and developed AA woman in no distress HEENT - C&S clear, good dentition Neck - supple Chest - good breath sounds and normal respirations Cor - 2+ radial pulse and a regular rate and rhythm. Neuro - A&O x 3, normal gait.       Assessment & Plan:  Summary - a very nice woman who has established for care (stealing her mother's doctor) who is asked to return in several months for a follow-up visit.

## 2010-12-22 NOTE — Assessment & Plan Note (Signed)
BP Readings from Last 3 Encounters:  12/20/10 132/88  09/20/10 120/76  08/16/10 108/68   Upward trend over the past several weeks.   Plan - continue present medication           Home monitoring with report back, especially if SBP rise to 140 or higher.

## 2010-12-22 NOTE — Assessment & Plan Note (Signed)
Discussed the importance of smoking cessation in regard to heart disease, UC lung disease.  Plan - she is at a contemplative stage and will return when ready to quit.

## 2010-12-22 NOTE — Assessment & Plan Note (Addendum)
Mild elevation in LD at 144.  Plan - Continue lipitor, taken DAILY           life-style management with a low fat diet and increased aerobic exercise           Repeat lipid profile in 6 months.

## 2010-12-22 NOTE — Assessment & Plan Note (Signed)
Negative evaluation by Dr. Jens Som.  Plan - continued risk factor reduction.

## 2011-01-03 ENCOUNTER — Telehealth: Payer: Self-pay | Admitting: Internal Medicine

## 2011-01-03 NOTE — Telephone Encounter (Signed)
Forwarded to Dr. Norins for review. °

## 2011-01-31 LAB — POCT I-STAT, CHEM 8
BUN: 4 — ABNORMAL LOW
Calcium, Ion: 1.12
Chloride: 102
Creatinine, Ser: 0.9
Glucose, Bld: 90
HCT: 50 — ABNORMAL HIGH
Hemoglobin: 17 — ABNORMAL HIGH
Potassium: 3.7
Sodium: 141
TCO2: 27

## 2011-02-01 LAB — POCT HEMOGLOBIN-HEMACUE: Hemoglobin: 15.7 — ABNORMAL HIGH

## 2011-09-30 HISTORY — PX: FOOT SURGERY: SHX648

## 2011-10-18 ENCOUNTER — Telehealth: Payer: Self-pay | Admitting: Obstetrics and Gynecology

## 2011-10-18 NOTE — Telephone Encounter (Signed)
Tc to pt per telephone call. Pt needs screening mammogram order faxed to [facility] [phone]. Pt also wanted copy of last mammogram from 09/2009 faxed to them for their review. Copy of last mammogram and order for screening mammogram faxed. Pt will call to sched. Pt voices understanding.

## 2011-10-18 NOTE — Telephone Encounter (Signed)
Deborah Stewart received °

## 2011-10-24 ENCOUNTER — Other Ambulatory Visit: Payer: Self-pay | Admitting: Gastroenterology

## 2011-10-24 NOTE — Telephone Encounter (Signed)
Dr Patterson can we refill?  

## 2011-10-24 NOTE — Telephone Encounter (Signed)
yes

## 2011-10-24 NOTE — Telephone Encounter (Signed)
Refilled medication per Dr Jarold Motto

## 2011-10-31 ENCOUNTER — Ambulatory Visit: Payer: Self-pay

## 2011-11-10 DIAGNOSIS — IMO0001 Reserved for inherently not codable concepts without codable children: Secondary | ICD-10-CM | POA: Insufficient documentation

## 2011-11-10 DIAGNOSIS — IMO0002 Reserved for concepts with insufficient information to code with codable children: Secondary | ICD-10-CM | POA: Insufficient documentation

## 2011-11-10 DIAGNOSIS — N809 Endometriosis, unspecified: Secondary | ICD-10-CM | POA: Insufficient documentation

## 2011-11-10 DIAGNOSIS — N946 Dysmenorrhea, unspecified: Secondary | ICD-10-CM | POA: Insufficient documentation

## 2011-11-10 DIAGNOSIS — N870 Mild cervical dysplasia: Secondary | ICD-10-CM | POA: Insufficient documentation

## 2011-11-10 DIAGNOSIS — Z8619 Personal history of other infectious and parasitic diseases: Secondary | ICD-10-CM | POA: Insufficient documentation

## 2011-11-10 DIAGNOSIS — R102 Pelvic and perineal pain: Secondary | ICD-10-CM | POA: Insufficient documentation

## 2011-11-14 ENCOUNTER — Encounter: Payer: Self-pay | Admitting: Obstetrics and Gynecology

## 2011-11-15 ENCOUNTER — Encounter: Payer: Self-pay | Admitting: Obstetrics and Gynecology

## 2011-11-15 ENCOUNTER — Ambulatory Visit (INDEPENDENT_AMBULATORY_CARE_PROVIDER_SITE_OTHER): Payer: 59 | Admitting: Obstetrics and Gynecology

## 2011-11-15 VITALS — BP 102/62 | HR 76 | Ht 65.0 in | Wt 177.0 lb

## 2011-11-15 DIAGNOSIS — N949 Unspecified condition associated with female genital organs and menstrual cycle: Secondary | ICD-10-CM

## 2011-11-15 DIAGNOSIS — R102 Pelvic and perineal pain: Secondary | ICD-10-CM

## 2011-11-15 DIAGNOSIS — Z124 Encounter for screening for malignant neoplasm of cervix: Secondary | ICD-10-CM

## 2011-11-15 DIAGNOSIS — N809 Endometriosis, unspecified: Secondary | ICD-10-CM

## 2011-11-15 NOTE — Progress Notes (Signed)
Last Pap: 10/2007 WNL: Yes Regular Periods:no Contraception: Hysterectomy  Monthly Breast exam:yes Tetanus<60yrs:no Nl.Bladder Function:yes Daily BMs:yes Healthy Diet: no Calcium:yes Mammogram:yes Date of Mammogram: 10/31/11 Exercise:no Have often Exercise:  Seatbelt: yes Abuse at home: no Stressful work:yes Sigmoid-colonoscopy: 2012 per pt Bone Density: Yes (Dr. Allyne Gee) PCP: Dr. Illene Regulus Change in PMH: none Change in ZOX:WRUE  Subjective:    Deborah Stewart is a 56 y.o. female G1P1 who presents for annual exam.  The patient has no complaints today.   The following portions of the patient's history were reviewed and updated as appropriate: allergies, current medications, past family history, past medical history, past social history, past surgical history and problem list.  Review of Systems A comprehensive review of systems was negative except for: Gastrointestinal: positive for increased flatulence Gastrointestinal:No change in bowel habits, no abdominal pain, no rectal bleeding Genitourinary:negative for dysuria, frequency, hematuria, nocturia and urinary incontinence    Objective:     BP 102/62  Pulse 76  Ht 5\' 5"  (1.651 m)  Wt 177 lb (80.287 kg)  BMI 29.45 kg/m2  Weight:  Wt Readings from Last 1 Encounters:  11/15/11 177 lb (80.287 kg)     BMI: Body mass index is 29.45 kg/(m^2). General Appearance: Alert, appropriate appearance for age. No acute distress HEENT: Grossly normal Neck / Thyroid: Supple, no masses, nodes or enlargement Lungs: clear to auscultation bilaterally Back: No CVA tenderness Breast Exam: tender to palpation, bilateral and No masses or nodes.No dimpling, nipple retraction or discharge. Cardiovascular: Regular rate and rhythm. S1, S2, no murmur Gastrointestinal: Soft, non-tender, no masses or organomegaly Pelvic Exam: External genitalia: normal general appearance Vaginal: normal mucosa without prolapse or lesions and vaginal vault,  well-healed Cervix: removed surgically Adnexa: removed surgically Uterus: removed surgically Rectovaginal: normal rectal, no masses Lymphatic Exam: Non-palpable nodes in neck, clavicular, axillary, or inguinal regions Skin: no rash or abnormalities Neurologic: Normal gait and speech, no tremor  Psychiatric: Alert and oriented, appropriate affect.    Urinalysis:Not done      Assessment:    normal post hysterectomy examination in a patient who had endometriosis and is now asymptomatic  Complained of increased flatulence   Plan:   Patient referred to her gastroenterologist Dr. Jarold Motto to reviewed the use of her medication for diverticulosis and her complaint of increased flatulence Follow-up:  for annual exam

## 2011-12-07 ENCOUNTER — Ambulatory Visit (INDEPENDENT_AMBULATORY_CARE_PROVIDER_SITE_OTHER): Payer: 59 | Admitting: Gastroenterology

## 2011-12-07 ENCOUNTER — Encounter: Payer: Self-pay | Admitting: Gastroenterology

## 2011-12-07 VITALS — BP 120/80 | HR 84 | Ht 64.0 in | Wt 175.6 lb

## 2011-12-07 DIAGNOSIS — K5730 Diverticulosis of large intestine without perforation or abscess without bleeding: Secondary | ICD-10-CM

## 2011-12-07 DIAGNOSIS — K523 Indeterminate colitis: Secondary | ICD-10-CM

## 2011-12-07 DIAGNOSIS — K5289 Other specified noninfective gastroenteritis and colitis: Secondary | ICD-10-CM

## 2011-12-07 MED ORDER — BALSALAZIDE DISODIUM 750 MG PO CAPS: ORAL_CAPSULE | ORAL | Status: DC

## 2011-12-07 NOTE — Progress Notes (Signed)
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 She has no complaints except the cause for medication. She is up-to-date her colonoscopy exams. She tries to follow a high fiber diet as best as possible.  Current Medications, Allergies, Past Medical History, Past Surgical History, Family History and Social History were reviewed in [employer] record.  Pertinent Review of Systems Negative   Physical Exam: Blood pressure 120/80, pulse 84 and regular, and weight 175 pounds with BMI of 30.14. Abdominal exam unremarkable without organomegaly, masses or tenderness.    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 We will change her medication to a more affordable Colazal 4 tablets a day with when necessary followup as needed. No diagnosis found.

## 2011-12-07 NOTE — Patient Instructions (Addendum)
We have sent the following medications to your pharmacy for you to pick up at your convenience: Balsalazide 4 capsules daily (in place of Lialda). Cc: Dr Illene Regulus

## 2012-01-23 ENCOUNTER — Other Ambulatory Visit: Payer: Self-pay | Admitting: Internal Medicine

## 2012-05-03 ENCOUNTER — Other Ambulatory Visit: Payer: Self-pay | Admitting: Internal Medicine

## 2012-07-08 ENCOUNTER — Ambulatory Visit (INDEPENDENT_AMBULATORY_CARE_PROVIDER_SITE_OTHER): Payer: 59 | Admitting: Internal Medicine

## 2012-07-08 ENCOUNTER — Encounter: Payer: Self-pay | Admitting: Internal Medicine

## 2012-07-08 VITALS — BP 110/78 | HR 72 | Temp 97.8°F | Wt 161.0 lb

## 2012-07-08 DIAGNOSIS — G8929 Other chronic pain: Secondary | ICD-10-CM

## 2012-07-08 DIAGNOSIS — F17200 Nicotine dependence, unspecified, uncomplicated: Secondary | ICD-10-CM

## 2012-07-08 DIAGNOSIS — I10 Essential (primary) hypertension: Secondary | ICD-10-CM

## 2012-07-08 DIAGNOSIS — Z0000 Encounter for general adult medical examination without abnormal findings: Secondary | ICD-10-CM

## 2012-07-08 DIAGNOSIS — E039 Hypothyroidism, unspecified: Secondary | ICD-10-CM

## 2012-07-08 DIAGNOSIS — M549 Dorsalgia, unspecified: Secondary | ICD-10-CM

## 2012-07-08 DIAGNOSIS — Z23 Encounter for immunization: Secondary | ICD-10-CM

## 2012-07-08 DIAGNOSIS — E785 Hyperlipidemia, unspecified: Secondary | ICD-10-CM

## 2012-07-08 DIAGNOSIS — Z720 Tobacco use: Secondary | ICD-10-CM

## 2012-07-08 HISTORY — DX: Encounter for general adult medical examination without abnormal findings: Z00.00

## 2012-07-08 HISTORY — DX: Other chronic pain: G89.29

## 2012-07-08 MED ORDER — LEVOTHYROXINE SODIUM 100 MCG PO TABS: 100.0000 ug | ORAL_TABLET | Freq: Every day | ORAL | Status: DC

## 2012-07-08 MED ORDER — VARENICLINE TARTRATE 0.5 MG X 11 & 1 MG X 42 PO MISC: ORAL | Status: DC

## 2012-07-08 MED ORDER — TRAMADOL HCL 50 MG PO TABS: 50.0000 mg | ORAL_TABLET | Freq: Three times a day (TID) | ORAL | Status: DC | PRN

## 2012-07-08 MED ORDER — CYCLOBENZAPRINE HCL 10 MG PO TABS: 10.0000 mg | ORAL_TABLET | Freq: Three times a day (TID) | ORAL | Status: DC | PRN

## 2012-07-08 NOTE — Progress Notes (Signed)
Subjective:     Patient ID: Deborah Stewart, female   DOB: 1956/03/11, 57 y.o.   MRN: [id number]  HPI Deborah Stewart is a 57 yo woman with a history of ulcerative colitis and chronic back pain that presents for an annual physical exam.  She reports doing well this past year she has started a diet/exercise regimen that has yielded a 20 lb weight loss.  She had a surgery last year to remove a "mass" from her foot.  She is a current 1 ppd smoker and is interested in cessation.  In the past she has tried nicotine gum, nicotine patches (caused her to break out in a rash), and Chantix with some success.  She is most interested in trying Chantix again as this yielded the best results in the past, she has a quit date of April 17 (going to visit her daughter and would like to be smoke free).  With regard to her UC she has recently been taking Lialda, she would like to change this medication secondary to cost.  Upon inquiry about other medications she indicates having not taken her atorvastatin in quite some time because she felt she didn't need it, she is unclear of her baseline cholesterol and we have no records.  Past Medical History  Diagnosis Date  . Unspecified hypothyroidism   . Personal history of colonic polyps 06/2010    hyperplastic, tubular adenoma  . Diverticulosis of colon (without mention of hemorrhage)   . Hypertension   . Hyperlipidemia   . Ulcerative colitis   . Thyroid nodule   . DDD (degenerative disc disease), lumbar     by MRI  . DDD (degenerative disc disease), cervical     s/p diskectomy   . Dysmenorrhea   . ASCUS on Pap smear   . Abnormal Pap smear   . Dysplasia of cervix, low grade (CIN 1)   . Pelvic pain in female   . History of chicken pox   . History of measles   . Dyspareunia   . Breast mass   . Endometriosis    Past Surgical History  Procedure Laterality Date  . Foot surgery      callus surgery both feet.  Dr. Ria Bush, DPO  . Thyroidectomy, partial   Feb '11  . Cervical disc surgery  2004    Dr. Dutch Quint  . Total abdominal hysterectomy w/ bilateral salpingoophorectomy    . Fibroadenoma excision    . Tubal ligation     Family History  Problem Relation Age of Onset  . Breast cancer Sister   . Lung cancer      uncle  . Diabetes Mother   . Breast cancer      great aunt  . Diabetes Father   . Heart disease Father     CHF  . Diabetes      grandparents  . Heart disease      grandfather  . Diabetes Brother   . Heart disease Brother   . Hypertension Brother   . Hyperlipidemia Brother   . Hypertension Brother    History   Social History  . Marital Status: Divorced    Spouse Name: N/A    Number of Children: 1  . Years of Education: 12   Occupational History  . filed Cytogeneticist    Social History Main Topics  . Smoking status: Current Every Day Smoker -- 1.00 packs/day for 35 years    Types: Cigarettes  . Smokeless tobacco: Never Used  Comment: Today is first day quitting  . Alcohol Use: 2.5 oz/week    5 drink(s) per week     Comment: Occasional  . Drug Use: No  . Sexually Active: Not on file   Other Topics Concern  . Not on file   Social History Narrative   [employer], [employer]BS social work. Married 1980 - 16 yrs/divorced. 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 Lives alone. Dating - long term relationship/[place]. Unprotected. No history of physical or sexual abuse.     Review of Systems  Constitutional: Positive for activity change (pt is exercising 4x a week Zumba). Negative for fatigue.  HENT: Negative for congestion and sore throat.   Cardiovascular: Negative for chest pain.  Gastrointestinal: Negative for nausea, vomiting, diarrhea and constipation.  Genitourinary: Negative for dysuria.  Musculoskeletal: Positive for back pain (chronic issue).  Neurological: Negative for headaches.  Psychiatric/Behavioral: Negative for dysphoric mood.       Objective:   Physical Exam  Constitutional:  She appears well-developed and well-nourished.  HENT:  Head: Normocephalic and atraumatic.  Right Ear: External ear normal.  Left Ear: External ear normal.  Eyes: EOM are normal. Pupils are equal, round, and reactive to light. Right eye exhibits no discharge. Left eye exhibits no discharge. No scleral icterus.  Neck: No JVD present.  Cardiovascular: Normal rate, regular rhythm, normal heart sounds and intact distal pulses.  Exam reveals no friction rub.   No murmur heard. Abdominal: Soft. Bowel sounds are normal. She exhibits no distension and no mass. There is no tenderness.  Obese  Musculoskeletal: She exhibits no edema.  Lymphadenopathy:    She has no cervical adenopathy.  Neurological: She is alert. She has normal reflexes.  Skin: Skin is warm and dry.  Psychiatric: She has a normal mood and affect.  Breast and pelvic exams deferred to gyn Filed Vitals:   07/08/12 0908  BP: 110/78  Pulse: 72  Temp: 97.8 F (36.6 C)   I have personally interviewed this patient and repeated pertinent parts of her exam and agree with the above.            Assessment/Plan:     Assessment and plan in problem oriented charting reviewed and I agree with the plans as presented.

## 2012-07-08 NOTE — Patient Instructions (Addendum)
Thanks for coming in.  Re: lipid management - will recheck lab to see if you need to restart medication.  Smoking cessation - Oh!!! So IMPORTANT. Smoking Cessation Quitting smoking is important to your health and has many advantages. However, it is not always easy to quit since nicotine is a very addictive drug. Often times, people try 3 times or more before being able to quit. This document explains the best ways for you to prepare to quit smoking. Quitting takes hard work and a lot of effort, but you can do it. ADVANTAGES OF QUITTING SMOKING  You will live longer, feel better, and live better.  Your body will feel the impact of quitting smoking almost immediately.  Within 20 minutes, blood pressure decreases. Your pulse returns to its normal level.  After 8 hours, carbon monoxide levels in the blood return to normal. Your oxygen level increases.  After 24 hours, the chance of having a heart attack starts to decrease. Your breath, hair, and body stop smelling like smoke.  After 48 hours, damaged nerve endings begin to recover. Your sense of taste and smell improve.  After 72 hours, the body is virtually free of nicotine. Your bronchial tubes relax and breathing becomes easier.  After 2 to 12 weeks, lungs can hold more air. Exercise becomes easier and circulation improves.  The risk of having a heart attack, stroke, cancer, or lung disease is greatly reduced.  After 1 year, the risk of coronary heart disease is cut in half.  After 5 years, the risk of stroke falls to the same as a nonsmoker.  After 10 years, the risk of lung cancer is cut in half and the risk of other cancers decreases significantly.  After 15 years, the risk of coronary heart disease drops, usually to the level of a nonsmoker.  If you are pregnant, quitting smoking will improve your chances of having a healthy baby.  The people you live with, especially any children, will be healthier.  You will have extra  money to spend on things other than cigarettes. QUESTIONS TO THINK ABOUT BEFORE ATTEMPTING TO QUIT You may want to talk about your answers with your caregiver.  Why do you want to quit?  If you tried to quit in the past, what helped and what did not?  What will be the most difficult situations for you after you quit? How will you plan to handle them?  Who can help you through the tough times? Your family? Friends? A caregiver?  What pleasures do you get from smoking? What ways can you still get pleasure if you quit? Here are some questions to ask your caregiver:  How can you help me to be successful at quitting?  What medicine do you think would be best for me and how should I take it?  What should I do if I need more help?  What is smoking withdrawal like? How can I get information on withdrawal? GET READY  Set a quit date.  Change your environment by getting rid of all cigarettes, ashtrays, matches, and lighters in your home, car, or work. Do not let people smoke in your home.  Review your past attempts to quit. Think about what worked and what did not. GET SUPPORT AND ENCOURAGEMENT You have a better chance of being successful if you have help. You can get support in many ways.  Tell your family, friends, and co-workers that you are going to quit and need their support. Ask them not to  smoke around you.  Get individual, group, or telephone counseling and support. Programs are available at [employer] and health centers. Call your local health department for information about programs in your area.  Spiritual beliefs and practices may help some smokers quit.  Download a "quit meter" on your computer to keep track of quit statistics, such as how long you have gone without smoking, cigarettes not smoked, and money saved.  Get a self-help book about quitting smoking and staying off of tobacco. LEARN NEW SKILLS AND BEHAVIORS  Distract yourself from urges to smoke. Talk to  someone, go for a walk, or occupy your time with a task.  Change your normal routine. Take a different route to work. Drink tea instead of coffee. Eat breakfast in a different place.  Reduce your stress. Take a hot bath, exercise, or read a book.  Plan something enjoyable to do every day. Reward yourself for not smoking.  Explore interactive web-based programs that specialize in helping you quit. GET MEDICINE AND USE IT CORRECTLY Medicines can help you stop smoking and decrease the urge to smoke. Combining medicine with the above behavioral methods and support can greatly increase your chances of successfully quitting smoking.  Nicotine replacement therapy helps deliver nicotine to your body without the negative effects and risks of smoking. Nicotine replacement therapy includes nicotine gum, lozenges, inhalers, nasal sprays, and skin patches. Some may be available over-the-counter and others require a prescription.  Antidepressant medicine helps people abstain from smoking, but how this works is unknown. This medicine is available by prescription.  Nicotinic receptor partial agonist medicine simulates the effect of nicotine in your brain. This medicine is available by prescription. Ask your caregiver for advice about which medicines to use and how to use them based on your health history. Your caregiver will tell you what side effects to look out for if you choose to be on a medicine or therapy. Carefully read the information on the package. Do not use any other product containing nicotine while using a nicotine replacement product.  RELAPSE OR DIFFICULT SITUATIONS Most relapses occur within the first 3 months after quitting. Do not be discouraged if you start smoking again. Remember, most people try several times before finally quitting. You may have symptoms of withdrawal because your body is used to nicotine. You may crave cigarettes, be irritable, feel very hungry, cough often, get headaches,  or have difficulty concentrating. The withdrawal symptoms are only temporary. They are strongest when you first quit, but they will go away within 10 14 days. To reduce the chances of relapse, try to:  Avoid drinking alcohol. Drinking lowers your chances of successfully quitting.  Reduce the amount of caffeine you consume. Once you quit smoking, the amount of caffeine in your body increases and can give you symptoms, such as a rapid heartbeat, sweating, and anxiety.  Avoid smokers because they can make you want to smoke.  Do not let weight gain distract you. Many smokers will gain weight when they quit, usually less than 10 pounds. Eat a healthy diet and stay active. You can always lose the weight gained after you quit.  Find ways to improve your mood other than smoking. FOR MORE INFORMATION  www.smokefree.gov  Document Released: 04/11/2001 Document Revised: 10/17/2011 Document Reviewed: 07/27/2011 [facility] Patient Information 2013 [place], [place].   Smoking Cessation, Tips for Success YOU CAN QUIT SMOKING If you are ready to quit smoking, congratulations! You have chosen to help yourself be healthier. Cigarettes bring nicotine, tar,  carbon monoxide, and other irritants into your body. Your lungs, heart, and blood vessels will be able to work better without these poisons. There are many different ways to quit smoking. Nicotine gum, nicotine patches, a nicotine inhaler, or nicotine nasal spray can help with physical craving. Hypnosis, support groups, and medicines help break the habit of smoking. Here are some tips to help you quit for good.  Throw away all cigarettes.  Clean and remove all ashtrays from your home, work, and car.  On a card, write down your reasons for quitting. Carry the card with you and read it when you get the urge to smoke.  Cleanse your body of nicotine. Drink enough water and fluids to keep your urine clear or pale yellow. Do this after quitting to flush the  nicotine from your body.  Learn to predict your moods. Do not let a bad situation be your excuse to have a cigarette. Some situations in your life might tempt you into wanting a cigarette.  Never have "just one" cigarette. It leads to wanting another and another. Remind yourself of your decision to quit.  Change habits associated with smoking. If you smoked while driving or when feeling stressed, try other activities to replace smoking. Stand up when drinking your coffee. Brush your teeth after eating. Sit in a different chair when you read the paper. Avoid alcohol while trying to quit, and try to drink fewer caffeinated beverages. Alcohol and caffeine may urge you to smoke.  Avoid foods and drinks that can trigger a desire to smoke, such as sugary or spicy foods and alcohol.  Ask people who smoke not to smoke around you.  Have something planned to do right after eating or having a cup of coffee. Take a walk or exercise to perk you up. This will help to keep you from overeating.  Try a relaxation exercise to calm you down and decrease your stress. Remember, you may be tense and nervous for the first 2 weeks after you quit, but this will pass.  Find new activities to keep your hands busy. Play with a pen, coin, or rubber band. Doodle or draw things on paper.  Brush your teeth right after eating. This will help cut down on the craving for the taste of tobacco after meals. You can try mouthwash, too.  Use oral substitutes, such as lemon drops, carrots, a cinnamon stick, or chewing gum, in place of cigarettes. Keep them handy so they are available when you have the urge to smoke.  When you have the urge to smoke, try deep breathing.  Designate your home as a nonsmoking area.  If you are a heavy smoker, ask your caregiver about a prescription for nicotine chewing gum. It can ease your withdrawal from nicotine.  Reward yourself. Set aside the cigarette money you save and buy yourself something  nice.  Look for support from others. Join a support group or smoking cessation program. Ask someone at home or at work to help you with your plan to quit smoking.  Always ask yourself, "Do I need this cigarette or is this just a reflex?" Tell yourself, "Today, I choose not to smoke," or "I do not want to smoke." You are reminding yourself of your decision to quit, even if you do smoke a cigarette. HOW WILL I FEEL WHEN I QUIT SMOKING?  The benefits of not smoking start within days of quitting.  You may have symptoms of withdrawal because your body is used to nicotine (  the addictive substance in cigarettes). You may crave cigarettes, be irritable, feel very hungry, cough often, get headaches, or have difficulty concentrating.  The withdrawal symptoms are only temporary. They are strongest when you first quit but will go away within 10 to 14 days.  When withdrawal symptoms occur, stay in control. Think about your reasons for quitting. Remind yourself that these are signs that your body is healing and getting used to being without cigarettes.  Remember that withdrawal symptoms are easier to treat than the major diseases that smoking can cause.  Even after the withdrawal is over, expect periodic urges to smoke. However, these cravings are generally short-lived and will go away whether you smoke or not. Do not smoke!  If you relapse and smoke again, do not lose hope. Most smokers quit 3 times before they are successful.  If you relapse, do not give up! Plan ahead and think about what you will do the next time you get the urge to smoke. LIFE AS A NONSMOKER: MAKE IT FOR A MONTH, MAKE IT FOR LIFE Day 1: Hang this page where you will see it every day. Day 2: Get rid of all ashtrays, matches, and lighters. Day 3: Drink water. Breathe deeply between sips. Day 4: Avoid places with smoke-filled air, such as bars, clubs, or the smoking section of restaurants. Day 5: Keep track of how much money you save  by not smoking. Day 6: Avoid boredom. Keep a good book with you or go to the movies. Day 7: Reward yourself! One week without smoking! Day 8: Make a dental appointment to get your teeth cleaned. Day 9: Decide how you will turn down a cigarette before it is offered to you. Day 10: Review your reasons for quitting. Day 11: Distract yourself. Stay active to keep your mind off smoking and to relieve tension. Take a walk, exercise, read a book, do a crossword puzzle, or try a new hobby. Day 12: Exercise. Get off the bus before your stop or use stairs instead of escalators. Day 13: Call on friends for support and encouragement. Day 14: Reward yourself! Two weeks without smoking! Day 15: Practice deep breathing exercises. Day 16: Bet a friend that you can stay a nonsmoker. Day 17: Ask to sit in nonsmoking sections of restaurants. Day 18: Hang up "No Smoking" signs. Day 19: Think of yourself as a nonsmoker. Day 20: Each morning, tell yourself you will not smoke. Day 21: Reward yourself! Three weeks without smoking! Day 22: Think of smoking in negative ways. Remember how it stains your teeth, gives you bad breath, and leaves you short of breath. Day 23: Eat a nutritious breakfast. Day 24:Do not relive your days as a smoker. Day 25: Hold a pencil in your hand when talking on the telephone. Day 26: Tell all your friends you do not smoke. Day 27: Think about how much better food tastes. Day 28: Remember, one cigarette is one too many. Day 29: Take up a hobby that will keep your hands busy. Day 30: Congratulations! One month without smoking! Give yourself a big reward. Your caregiver can direct you to community resources or hospitals for support, which may include:  Group support.  Education.  Hypnosis.  Subliminal therapy. Document Released: 01/14/2004 Document Revised: 07/10/2011 Document Reviewed: 02/01/2009 [facility] Patient Information 2013 [place], [place].

## 2012-07-08 NOTE — Assessment & Plan Note (Signed)
Assessement - pt is unclear about her pre-treatment lipid levels and we have no records of a lipid panel. Plan - Lipid panel, if elevated restart therapy with atorvastatin.  Pt encouraged to continue diet and exercise regimen and congratulated on recent weight loss.

## 2012-07-08 NOTE — Assessment & Plan Note (Signed)
Assessment - Pt has expressed interest in quitting smoking, she is interested in trying Chantix again Plan - Proper smoking cessation techniques were discussed with the pt (data gathering about habits, planning, implementation, and follow up), as well as proper use of replacement methods, all of her questions were answered to her satisfaction.  Pt prescribed Chantix and has a quit date of April 17.  Will follow up with her in approx. 2 months to gauge progress.

## 2012-07-08 NOTE — Assessment & Plan Note (Signed)
Refill tramadol (50-100 mg q 8 hours) Refill cylcobenzaprine

## 2012-07-08 NOTE — Assessment & Plan Note (Addendum)
Assessment - pt does not complain of any sx's consistent with hypothyroidism (cold intolerance, dry skin/hair, weight gain) Plan - TSH/T4 levels for baseline, refill levothyroxine prescription

## 2012-07-08 NOTE — Assessment & Plan Note (Signed)
Assessment - Deborah Stewart seems to be doing very well from a healthcare standpoint. Plan - Tdap vaccination today, Pnuemococcal vaccine today, general chemistry panel

## 2012-09-13 ENCOUNTER — Other Ambulatory Visit: Payer: Self-pay | Admitting: Internal Medicine

## 2012-10-03 ENCOUNTER — Emergency Department (HOSPITAL_BASED_OUTPATIENT_CLINIC_OR_DEPARTMENT_OTHER): Payer: 59

## 2012-10-03 ENCOUNTER — Encounter (HOSPITAL_BASED_OUTPATIENT_CLINIC_OR_DEPARTMENT_OTHER): Payer: Self-pay | Admitting: *Deleted

## 2012-10-03 ENCOUNTER — Emergency Department (HOSPITAL_BASED_OUTPATIENT_CLINIC_OR_DEPARTMENT_OTHER)
Admission: EM | Admit: 2012-10-03 | Discharge: 2012-10-03 | Disposition: A | Discharge status: Home / Self Care | Payer: 59 | Attending: Emergency Medicine | Admitting: Emergency Medicine

## 2012-10-03 DIAGNOSIS — Z8639 Personal history of other endocrine, nutritional and metabolic disease: Secondary | ICD-10-CM | POA: Insufficient documentation

## 2012-10-03 DIAGNOSIS — S0990XA Unspecified injury of head, initial encounter: Secondary | ICD-10-CM | POA: Insufficient documentation

## 2012-10-03 DIAGNOSIS — Z860100 Personal history of colon polyps, unspecified: Secondary | ICD-10-CM | POA: Insufficient documentation

## 2012-10-03 DIAGNOSIS — E039 Hypothyroidism, unspecified: Secondary | ICD-10-CM | POA: Insufficient documentation

## 2012-10-03 DIAGNOSIS — Z79899 Other long term (current) drug therapy: Secondary | ICD-10-CM | POA: Insufficient documentation

## 2012-10-03 DIAGNOSIS — S0993XA Unspecified injury of face, initial encounter: Secondary | ICD-10-CM | POA: Insufficient documentation

## 2012-10-03 DIAGNOSIS — Y92410 Unspecified street and highway as the place of occurrence of the external cause: Secondary | ICD-10-CM | POA: Insufficient documentation

## 2012-10-03 DIAGNOSIS — Z8742 Personal history of other diseases of the female genital tract: Secondary | ICD-10-CM | POA: Insufficient documentation

## 2012-10-03 DIAGNOSIS — S199XXA Unspecified injury of neck, initial encounter: Secondary | ICD-10-CM | POA: Insufficient documentation

## 2012-10-03 DIAGNOSIS — Z8719 Personal history of other diseases of the digestive system: Secondary | ICD-10-CM | POA: Insufficient documentation

## 2012-10-03 DIAGNOSIS — Z8739 Personal history of other diseases of the musculoskeletal system and connective tissue: Secondary | ICD-10-CM | POA: Insufficient documentation

## 2012-10-03 DIAGNOSIS — Z87891 Personal history of nicotine dependence: Secondary | ICD-10-CM | POA: Insufficient documentation

## 2012-10-03 DIAGNOSIS — I10 Essential (primary) hypertension: Secondary | ICD-10-CM | POA: Insufficient documentation

## 2012-10-03 DIAGNOSIS — Y9389 Activity, other specified: Secondary | ICD-10-CM | POA: Insufficient documentation

## 2012-10-03 DIAGNOSIS — Z862 Personal history of diseases of the blood and blood-forming organs and certain disorders involving the immune mechanism: Secondary | ICD-10-CM | POA: Insufficient documentation

## 2012-10-03 DIAGNOSIS — Z8619 Personal history of other infectious and parasitic diseases: Secondary | ICD-10-CM | POA: Insufficient documentation

## 2012-10-03 MED ORDER — DIAZEPAM 5 MG PO TABS: ORAL_TABLET | ORAL | Status: DC

## 2012-10-03 MED ORDER — IBUPROFEN 800 MG PO TABS: 800.0000 mg | ORAL_TABLET | Freq: Three times a day (TID) | ORAL | Status: DC

## 2012-10-03 MED ORDER — IBUPROFEN 800 MG PO TABS
800.0000 mg | ORAL_TABLET | Freq: Once | ORAL | Status: AC
  Administered 2012-10-03: 800 mg via ORAL
  Filled 2012-10-03: qty 1

## 2012-10-03 MED ORDER — DIAZEPAM 5 MG PO TABS
5.0000 mg | ORAL_TABLET | Freq: Once | ORAL | Status: AC
  Administered 2012-10-03: 5 mg via ORAL
  Filled 2012-10-03: qty 1

## 2012-10-03 MED ORDER — HYDROCODONE-ACETAMINOPHEN 5-325 MG PO TABS: ORAL_TABLET | ORAL | Status: DC

## 2012-10-03 NOTE — ED Provider Notes (Signed)
Medical screening examination/treatment/procedure(s) were performed by non-physician practitioner and as supervising physician I was immediately available for consultation/collaboration.  Kourtney Montesinos, MD 10/03/12 2344 
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
## 2012-10-03 NOTE — ED Notes (Signed)
Patient transported to X-ray 

## 2012-10-11 ENCOUNTER — Ambulatory Visit (INDEPENDENT_AMBULATORY_CARE_PROVIDER_SITE_OTHER): Payer: 59 | Admitting: Internal Medicine

## 2012-10-11 ENCOUNTER — Encounter: Payer: Self-pay | Admitting: Internal Medicine

## 2012-10-11 ENCOUNTER — Other Ambulatory Visit (INDEPENDENT_AMBULATORY_CARE_PROVIDER_SITE_OTHER): Payer: 59

## 2012-10-11 VITALS — BP 120/80 | HR 73 | Temp 97.0°F | Ht 65.0 in | Wt 155.2 lb

## 2012-10-11 DIAGNOSIS — I10 Essential (primary) hypertension: Secondary | ICD-10-CM

## 2012-10-11 DIAGNOSIS — E039 Hypothyroidism, unspecified: Secondary | ICD-10-CM

## 2012-10-11 DIAGNOSIS — R202 Paresthesia of skin: Secondary | ICD-10-CM

## 2012-10-11 DIAGNOSIS — E785 Hyperlipidemia, unspecified: Secondary | ICD-10-CM

## 2012-10-11 DIAGNOSIS — G5601 Carpal tunnel syndrome, right upper limb: Secondary | ICD-10-CM | POA: Insufficient documentation

## 2012-10-11 DIAGNOSIS — M542 Cervicalgia: Secondary | ICD-10-CM

## 2012-10-11 DIAGNOSIS — R209 Unspecified disturbances of skin sensation: Secondary | ICD-10-CM

## 2012-10-11 DIAGNOSIS — G4710 Hypersomnia, unspecified: Secondary | ICD-10-CM

## 2012-10-11 DIAGNOSIS — G5603 Carpal tunnel syndrome, bilateral upper limbs: Secondary | ICD-10-CM | POA: Insufficient documentation

## 2012-10-11 LAB — COMPREHENSIVE METABOLIC PANEL
ALT: 20 U/L (ref 0–35)
AST: 21 U/L (ref 0–37)
Albumin: 4 g/dL (ref 3.5–5.2)
Alkaline Phosphatase: 74 U/L (ref 39–117)
BUN: 10 mg/dL (ref 6–23)
CO2: 33 mEq/L — ABNORMAL HIGH (ref 19–32)
Calcium: 9.6 mg/dL (ref 8.4–10.5)
Chloride: 102 mEq/L (ref 96–112)
Creatinine, Ser: 0.7 mg/dL (ref 0.4–1.2)
GFR: 116.8 mL/min (ref >60.00)
Glucose, Bld: 59 mg/dL — ABNORMAL LOW (ref 70–99)
Potassium: 3.9 mEq/L (ref 3.5–5.1)
Sodium: 141 mEq/L (ref 135–145)
Total Bilirubin: 0.5 mg/dL (ref 0.3–1.2)
Total Protein: 7.4 g/dL (ref 6.0–8.3)

## 2012-10-11 LAB — LIPID PANEL
Cholesterol: 182 mg/dL (ref 0–200)
HDL: 38.7 mg/dL — ABNORMAL LOW (ref >39.00)
Total CHOL/HDL Ratio: 5
Triglycerides: 233 mg/dL — ABNORMAL HIGH (ref 0.0–149.0)
VLDL: 46.6 mg/dL — ABNORMAL HIGH (ref 0.0–40.0)

## 2012-10-11 LAB — TSH: TSH: 1.04 u[IU]/mL (ref 0.35–5.50)

## 2012-10-11 LAB — LDL CHOLESTEROL, DIRECT: Direct LDL: 112.1 mg/dL

## 2012-10-11 LAB — T4, FREE: Free T4: 0.91 ng/dL (ref 0.60–1.60)

## 2012-10-11 NOTE — Patient Instructions (Addendum)
Please use the right wrist splint at night to help avoid more numbness You will be contacted regarding the referral for: pulmonary for sleepiness Please go to the LAB in the Basement (turn left off the elevator) for the tests to be done today You will be contacted by phone if any changes need to be made immediately.  Otherwise, you will receive a letter about your results with an explanation  Thank you for enrolling in MyChart. Please follow the instructions below to securely access your online medical record. MyChart allows you to send messages to your doctor, view your test results, renew your prescriptions, schedule appointments, and more.
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
## 2012-10-12 DIAGNOSIS — M542 Cervicalgia: Secondary | ICD-10-CM | POA: Insufficient documentation

## 2012-10-12 NOTE — Assessment & Plan Note (Signed)
C/w strain, cant r/o radiculiti but overall mild, without neuro changes,  to f/u any worsening symptoms or concerns

## 2012-10-12 NOTE — Assessment & Plan Note (Signed)
?   CTS vs c-spine related neuritis, but overall mild, intermittent, no neuro change on exam, ok to follow for now

## 2012-10-12 NOTE — Assessment & Plan Note (Signed)
?   osa - for pulm referral 

## 2012-10-15 ENCOUNTER — Encounter: Payer: Self-pay | Admitting: Gastroenterology

## 2012-10-15 ENCOUNTER — Encounter: Payer: Self-pay | Admitting: Internal Medicine

## 2012-10-15 ENCOUNTER — Ambulatory Visit (INDEPENDENT_AMBULATORY_CARE_PROVIDER_SITE_OTHER): Payer: 59 | Admitting: Gastroenterology

## 2012-10-15 VITALS — BP 102/62 | HR 80 | Ht 65.0 in | Wt 152.1 lb

## 2012-10-15 DIAGNOSIS — K50119 Crohn's disease of large intestine with unspecified complications: Secondary | ICD-10-CM

## 2012-10-15 DIAGNOSIS — K5010 Crohn's disease of large intestine without complications: Secondary | ICD-10-CM

## 2012-10-15 DIAGNOSIS — R109 Unspecified abdominal pain: Secondary | ICD-10-CM

## 2012-10-15 MED ORDER — HYOSCYAMINE SULFATE 0.125 MG SL SUBL: 0.1250 mg | SUBLINGUAL_TABLET | Freq: Four times a day (QID) | SUBLINGUAL | Status: DC | PRN

## 2012-10-15 MED ORDER — MESALAMINE 800 MG PO TBEC: 800.0000 mg | DELAYED_RELEASE_TABLET | Freq: Two times a day (BID) | ORAL | Status: DC

## 2012-10-15 NOTE — Progress Notes (Signed)
This is a 57 year old African American female with diverticulosis-SCAD.Marland Kitchen doing well on oral amino salicylates.  She continued to have some crampy abdominal pain but no real diarrhea or rectal bleeding.  She wants to change amino salicylates because of cost constraints.  She overall feels well denies any general medical problems.  Other medications are listed and reviewed.  Current Medications, Allergies, Past Medical History, Past Surgical History, Family History and Social History were reviewed in [employer] record.  ROS: All systems were reviewed and are negative unless otherwise stated in the HPI.          Physical Exam: Blood pressure 102/62, pulse 80 and regular and weight 152 with a BMI of 25.31.  Chest is clear she is in a regular rhythm without murmurs gallops or rubs.  Abdominal exam shows no organomegaly, masses or tenderness.  Bowel sounds are normal.  Rectal exam was deferred.  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  I have changed her to Asacol HD 800 mg twice a day we will see she does symptomatically with when necessary sublingual Levsin use.  She is up-to-date on her colonoscopy exam and labs.  She is not anemic and B12 and iron levels are normal.  If this medication does not work she is to call and we will change to another aminosalicylate.  She is to continue her primary care followup with Dr. Illene Regulus. No diagnosis found.

## 2012-10-15 NOTE — Patient Instructions (Addendum)
We have sent the following medications to your pharmacy for you to pick up at your convenience: Asacol 800 mg, please take one tablet by mouth twice daily Refill for Levsin was sent to your pharmacy  Please follow up in one year with Dr. Jarold Motto or sooner if not any better.

## 2012-10-23 ENCOUNTER — Encounter: Payer: Self-pay | Admitting: Internal Medicine

## 2012-10-28 ENCOUNTER — Other Ambulatory Visit: Payer: Self-pay | Admitting: Internal Medicine

## 2012-11-20 ENCOUNTER — Other Ambulatory Visit: Payer: Self-pay | Admitting: Internal Medicine

## 2012-12-02 ENCOUNTER — Institutional Professional Consult (permissible substitution): Payer: 59 | Admitting: Internal Medicine

## 2012-12-04 ENCOUNTER — Other Ambulatory Visit: Payer: Self-pay | Admitting: Internal Medicine

## 2012-12-04 NOTE — Telephone Encounter (Signed)
This is for a starter pack, not for the maintenance dose. Did she stop and is now restarting or is this a continuation Rx request  Thanks

## 2012-12-05 NOTE — Telephone Encounter (Signed)
Left message for patient to return call.

## 2012-12-09 ENCOUNTER — Other Ambulatory Visit: Payer: Self-pay

## 2012-12-09 MED ORDER — VARENICLINE TARTRATE 1 MG PO TABS: 1.0000 mg | ORAL_TABLET | Freq: Two times a day (BID) | ORAL | Status: DC

## 2012-12-09 NOTE — Telephone Encounter (Signed)
Left msg for patient to return call.

## 2012-12-09 NOTE — Telephone Encounter (Signed)
I spoke to patient and she states she needs the maintenance dose

## 2012-12-09 NOTE — Telephone Encounter (Signed)
I verified with the patient that she needs maintenance dose sent to [facility] on [address].

## 2013-01-14 ENCOUNTER — Ambulatory Visit (INDEPENDENT_AMBULATORY_CARE_PROVIDER_SITE_OTHER): Payer: 59 | Admitting: Pulmonary Disease

## 2013-01-14 ENCOUNTER — Encounter: Payer: Self-pay | Admitting: Pulmonary Disease

## 2013-01-14 ENCOUNTER — Telehealth: Payer: Self-pay | Admitting: Internal Medicine

## 2013-01-14 VITALS — BP 112/78 | HR 69 | Temp 98.0°F | Ht 64.25 in | Wt 156.4 lb

## 2013-01-14 DIAGNOSIS — R0683 Snoring: Secondary | ICD-10-CM

## 2013-01-14 DIAGNOSIS — R0609 Other forms of dyspnea: Secondary | ICD-10-CM

## 2013-01-14 DIAGNOSIS — R0989 Other specified symptoms and signs involving the circulatory and respiratory systems: Secondary | ICD-10-CM

## 2013-01-14 HISTORY — DX: Snoring: R06.83

## 2013-01-14 NOTE — Assessment & Plan Note (Signed)
The pt has a history of snoring, frequent awakenings at night, and nonrestorative sleep.  She has some sleepiness during the day, but it is not overly so.  I have outlined a conservative path where she will continue working on weight reduction and see how she feels.  If she wishes to be more aggressive, I would recommend proceeding with a sleep study.  At this point, the patient would like to take a more conservative approach and continue working on weight loss.  I have also reviewed with her the importance of good sleep hygiene.  She will call if she wishes to proceed with a sleep study for further evaluation.

## 2013-01-14 NOTE — Telephone Encounter (Signed)
Requesting referral to see Dr Lurena Joiner Tat, numbness in right hand

## 2013-01-14 NOTE — Progress Notes (Signed)
Subjective:    Patient ID: Deborah Stewart, female    DOB: May 31, 1955, 57 y.o.   MRN: [id number]  HPI The patient is a 57 year old female who I've been asked to see for possible obstructive sleep apnea.  She has been noted to have loud snoring by her family, but no one has ever mentioned an abnormal breathing pattern during sleep.  She denies choking arousals, but has had frequent awakenings at night.  She has not completely rested in the mornings upon arising.  She does not think she takes during the night, and does not describe any classic restless leg syndrome symptoms.  She has mild sleep pressure at times during the day with inactivity, but it is not overly severe.  She can also get sleepy in the evenings watching television or movies, but has no sleepiness with driving.  The patient states that her weight has decreased 35 pounds in the last one year, and her Epworth score today is only 10.   Sleep Questionnaire What time do you typically go to bed?( Between what hours) 11p-12am 11p-12am at 1633 on 01/14/13 by Maisie Fus, CMA How long does it take you to fall asleep? - 1hr - 1hr at 1633 on 01/14/13 by Maisie Fus, CMA How many times during the night do you wake up? 2 2 at 1633 on 01/14/13 by Maisie Fus, CMA What time do you get out of bed to start your day? 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 (In pounds) 35 lb (15.876 kg)35 lb (15.876 kg) decrease at 1633 on 01/14/13 by Maisie Fus, CMA Have you ever had a sleep study before? No No at 1633 on 01/14/13 by Maisie Fus, CMA Do you currently use CPAP? No No at 1633 on 01/14/13 by Maisie Fus, CMA Do you wear oxygen at any time? No No at 1633 on 01/14/13 by  Maisie Fus, CMA   Review of Systems  Constitutional: Positive for unexpected weight change. Negative for fever.  HENT: Negative for ear pain, nosebleeds, congestion, sore throat, rhinorrhea, sneezing, trouble swallowing, dental problem, postnasal drip and sinus pressure.   Eyes: Negative for redness and itching.  Respiratory: Negative for cough, chest tightness, shortness of breath and wheezing.   Cardiovascular: Negative for palpitations and leg swelling.  Gastrointestinal: Negative for nausea and vomiting.  Genitourinary: Negative for dysuria.  Musculoskeletal: Positive for joint swelling and arthralgias.  Skin: Positive for rash ( itching).  Neurological: Positive for headaches.  Hematological: Does not bruise/bleed easily.  Psychiatric/Behavioral: Negative for dysphoric mood. The patient is nervous/anxious.        Objective:   Physical Exam Constitutional:  Well developed, no acute distress  HENT:  Nares patent without discharge  Oropharynx without exudate, palate and uvula are mildly elongated.   Eyes:  Perrla, eomi, no scleral icterus  Neck:  No JVD, no TMG  Cardiovascular:  Normal rate, regular rhythm, no rubs or gallops.  No murmurs        Intact distal pulses  Pulmonary :  Normal breath sounds, no stridor or respiratory distress   No rales, rhonchi, or wheezing  Abdominal:  Soft, nondistended, bowel sounds present.  No tenderness noted.  Musculoskeletal:  No lower extremity edema noted.  Lymph Nodes:  No cervical lymphadenopathy noted  Skin:  No cyanosis noted  Neurologic:  Alert, appropriate, moves all 4 extremities without obvious deficit.         Assessment & Plan:

## 2013-01-14 NOTE — Patient Instructions (Addendum)
Take the next 6mos and continue working on weight loss.  If you are still symptomatic, would recommend have a sleep study for evaluation.  If you decide to be more aggressive in your work up between now and the 6mos, please let me know.

## 2013-01-15 NOTE — Telephone Encounter (Signed)
Left message to return call 

## 2013-01-15 NOTE — Telephone Encounter (Signed)
1. Needs follow up ov as part of determining reason/need for consultation 2. 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 We have a general neurologist for things such as this.

## 2013-01-17 NOTE — Telephone Encounter (Signed)
Transferred patient to front desk to schedule appt

## 2013-01-21 ENCOUNTER — Ambulatory Visit: Payer: Self-pay

## 2013-02-03 ENCOUNTER — Other Ambulatory Visit: Payer: Self-pay | Admitting: Internal Medicine

## 2013-02-03 ENCOUNTER — Encounter: Payer: Self-pay | Admitting: Internal Medicine

## 2013-02-03 ENCOUNTER — Ambulatory Visit (INDEPENDENT_AMBULATORY_CARE_PROVIDER_SITE_OTHER): Payer: 59 | Admitting: Internal Medicine

## 2013-02-03 VITALS — BP 118/80 | HR 68 | Temp 97.6°F | Wt 153.0 lb

## 2013-02-03 DIAGNOSIS — Z720 Tobacco use: Secondary | ICD-10-CM

## 2013-02-03 DIAGNOSIS — R209 Unspecified disturbances of skin sensation: Secondary | ICD-10-CM

## 2013-02-03 DIAGNOSIS — R519 Headache, unspecified: Secondary | ICD-10-CM | POA: Insufficient documentation

## 2013-02-03 DIAGNOSIS — F17200 Nicotine dependence, unspecified, uncomplicated: Secondary | ICD-10-CM

## 2013-02-03 DIAGNOSIS — R202 Paresthesia of skin: Secondary | ICD-10-CM

## 2013-02-03 DIAGNOSIS — R51 Headache: Secondary | ICD-10-CM | POA: Insufficient documentation

## 2013-02-03 DIAGNOSIS — I10 Essential (primary) hypertension: Secondary | ICD-10-CM

## 2013-02-03 NOTE — Patient Instructions (Addendum)
1. Hand numbness - with a positive tinel's sign this is most consistent with carpal tunnel syndrome Plan Continue with the cockup wrist splints - at night and while driving  2. Headache - Tension headache (see below) Plan For headache take a NSAID, e.g. Aleve (naproxen sodium) 1 or 2 tabs every 12 hours  3. BP - doesn't wound like you need to continue medication - ok to stop HCTZ.   Carpal Tunnel Syndrome The carpal tunnel is a narrow area located on the palm side of your wrist. The tunnel is formed by the wrist bones and ligaments. Nerves, blood vessels, and tendons pass through the carpal tunnel. Repeated wrist motion or certain diseases may cause swelling within the tunnel. This swelling pinches the main nerve in the wrist (median nerve) and causes the painful hand and arm condition called carpal tunnel syndrome. CAUSES   Repeated wrist motions.  Wrist injuries.  Certain diseases like arthritis, diabetes, alcoholism, hyperthyroidism, and kidney failure.  Obesity.  Pregnancy. SYMPTOMS   A "pins and needles" feeling in your fingers or hand.  Tingling or numbness in your fingers or hand.  An aching feeling in your entire arm.  Wrist pain that goes up your arm to your shoulder.  Pain that goes down into your palm or fingers.  A weak feeling in your hands. DIAGNOSIS  Your caregiver will take your history and perform a physical exam. An electromyography test may be needed. This test measures electrical signals sent out by the muscles. The electrical signals are usually slowed by carpal tunnel syndrome. You may also need X-rays. TREATMENT  Carpal tunnel syndrome may clear up by itself. Your caregiver may recommend a wrist splint or medicine such as a nonsteroidal anti-inflammatory medicine. Cortisone injections may help. Sometimes, surgery may be needed to free the pinched nerve.  HOME CARE INSTRUCTIONS   Take all medicine as directed by your caregiver. Only take over-the-counter  or prescription medicines for pain, discomfort, or fever as directed by your caregiver.  If you were given a splint to keep your wrist from bending, wear it as directed. It is important to wear the splint at night. Wear the splint for as long as you have pain or numbness in your hand, arm, or wrist. This may take 1 to 2 months.  Rest your wrist from any activity that may be causing your pain. If your symptoms are work-related, you may need to talk to your employer about changing to a job that does not require using your wrist.  Put ice on your wrist after long periods of wrist activity.  Put ice in a plastic bag.  Place a towel between your skin and the bag.  Leave the ice on for 15-20 minutes, 3-4 times a day.  Keep all follow-up visits as directed by your caregiver. This includes any orthopedic referrals, physical therapy, and rehabilitation. Any delay in getting necessary care could result in a delay or failure of your condition to heal. SEEK IMMEDIATE MEDICAL CARE IF:   You have new, unexplained symptoms.  Your symptoms get worse and are not helped or controlled with medicines. MAKE SURE YOU:   Understand these instructions.  Will watch your condition.  Will get help right away if you are not doing well or get worse. Document Released: 04/14/2000 Document Revised: 07/10/2011 Document Reviewed: 03/03/2011 [facility] Patient Information 2014 [place], [place].   Tension Headache A tension headache is a feeling of pain, pressure, or aching often felt over the front and sides  of the head. The pain can be dull or can feel tight (constricting). It is the most common type of headache. Tension headaches are not normally associated with nausea or vomiting and do not get worse with physical activity. Tension headaches can last 30 minutes to several days.  CAUSES  The exact cause is not known, but it may be caused by chemicals and hormones in the brain that lead to pain. Tension headaches  often begin after stress, anxiety, or depression. Other triggers may include:  Alcohol.  Caffeine (too much or withdrawal).  Respiratory infections (colds, flu, sinus infections).  Dental problems or teeth clenching.  Fatigue.  Holding your head and neck in one position too long while using a computer. SYMPTOMS   Pressure around the head.   Dull, aching head pain.   Pain felt over the front and sides of the head.   Tenderness in the muscles of the head, neck, and shoulders. DIAGNOSIS  A tension headache is often diagnosed based on:   Symptoms.   Physical examination.   A CT scan or MRI of your head. These tests may be ordered if symptoms are severe or unusual. TREATMENT  Medicines may be given to help relieve symptoms.  HOME CARE INSTRUCTIONS   Only take over-the-counter or prescription medicines for pain or discomfort as directed by your caregiver.   Lie down in a dark, quiet room when you have a headache.   Keep a journal to find out what may be triggering your headaches. For example, write down:  What you eat and drink.  How much sleep you get.  Any change to your diet or medicines.  Try massage or other relaxation techniques.   Ice packs or heat applied to the head and neck can be used. Use these 3 to 4 times per day for 15 to 20 minutes each time, or as needed.   Limit stress.   Sit up straight, and do not tense your muscles.   Quit smoking if you smoke.  Limit alcohol use.  Decrease the amount of caffeine you drink, or stop drinking caffeine.  Eat and exercise regularly.  Get 7 to 9 hours of sleep, or as recommended by your caregiver.  Avoid excessive use of pain medicine as recurrent headaches can occur.  SEEK MEDICAL CARE IF:   You have problems with the medicines you were prescribed.  Your medicines do not work.  You have a change from the usual headache.  You have nausea or vomiting. SEEK IMMEDIATE MEDICAL CARE IF:    Your headache becomes severe.  You have a fever.  You have a stiff neck.  You have loss of vision.  You have muscular weakness or loss of muscle control.  You lose your balance or have trouble walking.  You feel faint or pass out.  You have severe symptoms that are different from your first symptoms. MAKE SURE YOU:   Understand these instructions.  Will watch your condition.  Will get help right away if you are not doing well or get worse. Document Released: 04/17/2005 Document Revised: 07/10/2011 Document Reviewed: 04/07/2011 [facility] Patient Information 2014 [place], [place].

## 2013-02-03 NOTE — Assessment & Plan Note (Addendum)
Provided with encouragement. Advised to give up the last 5 cigarettes as quickly as possible. Patient already shows strong motivation to quit and will continue Chantix 1 mg BID.

## 2013-02-03 NOTE — Assessment & Plan Note (Addendum)
02/03/13: Deborah Stewart presents with intermittent episodes of right hand tingling and numbness that can last for hours and are have no precipitating factors. Wearing a wrist brace seems to decrease the frequency of the episodes. Differential includes carpal tunnel syndrome vs. cervical spinal root stenosis. Tinel sign positive and phalen test negative on physical exam. History of cervical spinal stenosis and recent cervical spinal XR shows stenosis and bony changes. Given positive tinel sign and history of improvement with wrist brace, carpal tunnel syndrome is the most likely etiology.   Plan Continue to wear wrist brace at night and during the day while driving.
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
## 2013-02-03 NOTE — Assessment & Plan Note (Addendum)
Given normal blood pressures with only intermittent use of HCTZ, ok to discontinue HCTZ.
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
## 2013-06-19 ENCOUNTER — Telehealth: Payer: Self-pay | Admitting: *Deleted

## 2013-06-19 ENCOUNTER — Other Ambulatory Visit: Payer: Self-pay | Admitting: Gastroenterology

## 2013-06-19 NOTE — Telephone Encounter (Signed)
RECEIVED FAX FROM [facility] NEEDED FOR ASACOL NUMBER TO CALL IS [phone] PATIENT WAS ON LIALDA UNTIL ASACOL CAN BE APPROVED  I SPOKE WITH ERIC AT [facility] [employer]  PATIENT HAS TRIED LIALDA AND COLAZAL  I WAS TRANSFERRED TO ANOTHER DEPARTMENT AND SPOKE WITH LINDA PER LINDA ASACOL WAS APPROVED

## 2013-08-18 ENCOUNTER — Other Ambulatory Visit: Payer: Self-pay | Admitting: *Deleted

## 2013-08-18 MED ORDER — HYDROCHLOROTHIAZIDE 12.5 MG PO CAPS: ORAL_CAPSULE | ORAL | Status: DC

## 2013-12-29 ENCOUNTER — Other Ambulatory Visit: Payer: Self-pay | Admitting: *Deleted

## 2013-12-29 MED ORDER — VARENICLINE TARTRATE 1 MG PO TABS: 1.0000 mg | ORAL_TABLET | Freq: Two times a day (BID) | ORAL | Status: DC

## 2013-12-31 ENCOUNTER — Other Ambulatory Visit: Payer: Self-pay

## 2013-12-31 MED ORDER — VARENICLINE TARTRATE 1 MG PO TABS: 1.0000 mg | ORAL_TABLET | Freq: Two times a day (BID) | ORAL | Status: DC

## 2014-01-01 ENCOUNTER — Encounter: Payer: Self-pay | Admitting: Gastroenterology

## 2014-03-02 ENCOUNTER — Encounter: Payer: Self-pay | Admitting: Internal Medicine

## 2014-03-11 ENCOUNTER — Ambulatory Visit (INDEPENDENT_AMBULATORY_CARE_PROVIDER_SITE_OTHER): Payer: 59 | Admitting: Internal Medicine

## 2014-03-11 ENCOUNTER — Other Ambulatory Visit (INDEPENDENT_AMBULATORY_CARE_PROVIDER_SITE_OTHER): Payer: 59

## 2014-03-11 ENCOUNTER — Ambulatory Visit (INDEPENDENT_AMBULATORY_CARE_PROVIDER_SITE_OTHER): Payer: 59 | Admitting: Geriatric Medicine

## 2014-03-11 ENCOUNTER — Other Ambulatory Visit: Payer: Self-pay | Admitting: Internal Medicine

## 2014-03-11 ENCOUNTER — Encounter: Payer: Self-pay | Admitting: Internal Medicine

## 2014-03-11 VITALS — BP 120/82 | HR 80 | Temp 98.2°F | Resp 12 | Ht 64.0 in | Wt 174.0 lb

## 2014-03-11 DIAGNOSIS — I10 Essential (primary) hypertension: Secondary | ICD-10-CM

## 2014-03-11 DIAGNOSIS — E039 Hypothyroidism, unspecified: Secondary | ICD-10-CM

## 2014-03-11 DIAGNOSIS — M549 Dorsalgia, unspecified: Secondary | ICD-10-CM

## 2014-03-11 DIAGNOSIS — Z720 Tobacco use: Secondary | ICD-10-CM

## 2014-03-11 DIAGNOSIS — Z23 Encounter for immunization: Secondary | ICD-10-CM

## 2014-03-11 DIAGNOSIS — E669 Obesity, unspecified: Secondary | ICD-10-CM

## 2014-03-11 DIAGNOSIS — E785 Hyperlipidemia, unspecified: Secondary | ICD-10-CM

## 2014-03-11 DIAGNOSIS — K5190 Ulcerative colitis, unspecified, without complications: Secondary | ICD-10-CM

## 2014-03-11 DIAGNOSIS — Z0000 Encounter for general adult medical examination without abnormal findings: Secondary | ICD-10-CM

## 2014-03-11 DIAGNOSIS — G8929 Other chronic pain: Secondary | ICD-10-CM

## 2014-03-11 LAB — BASIC METABOLIC PANEL
BUN: 12 mg/dL (ref 6–23)
CO2: 29 mEq/L (ref 19–32)
Calcium: 9.2 mg/dL (ref 8.4–10.5)
Chloride: 104 mEq/L (ref 96–112)
Creatinine, Ser: 0.7 mg/dL (ref 0.4–1.2)
GFR: 105.26 mL/min (ref >60.00)
Glucose, Bld: 94 mg/dL (ref 70–99)
Potassium: 4.2 mEq/L (ref 3.5–5.1)
Sodium: 140 mEq/L (ref 135–145)

## 2014-03-11 LAB — LIPID PANEL
Cholesterol: 252 mg/dL — ABNORMAL HIGH (ref 0–200)
HDL: 40 mg/dL (ref >39.00)
LDL Cholesterol: 188 mg/dL — ABNORMAL HIGH (ref 0–99)
NonHDL: 212
Total CHOL/HDL Ratio: 6
Triglycerides: 121 mg/dL (ref 0.0–149.0)
VLDL: 24.2 mg/dL (ref 0.0–40.0)

## 2014-03-11 LAB — HEMOGLOBIN A1C: Hgb A1c MFr Bld: 5.7 % (ref 4.6–6.5)

## 2014-03-11 LAB — TSH: TSH: 1.41 u[IU]/mL (ref 0.35–4.50)

## 2014-03-11 MED ORDER — NEOMYCIN-POLYMYXIN-HC 3.5-10000-1 OT SOLN: 2.0000 [drp] | Freq: Three times a day (TID) | OTIC | Status: DC

## 2014-03-11 MED ORDER — CYCLOBENZAPRINE HCL 10 MG PO TABS: 10.0000 mg | ORAL_TABLET | Freq: Three times a day (TID) | ORAL | Status: DC | PRN

## 2014-03-11 MED ORDER — LEVOTHYROXINE SODIUM 100 MCG PO TABS: 100.0000 ug | ORAL_TABLET | Freq: Every day | ORAL | Status: DC

## 2014-03-11 MED ORDER — DICLOFENAC SODIUM 1 % TD GEL: 2.0000 g | Freq: Four times a day (QID) | TRANSDERMAL | Status: DC

## 2014-03-11 NOTE — Assessment & Plan Note (Signed)
BP not high last several visits. She is not taking blood pressure medication. We'll continue to monitor.

## 2014-03-11 NOTE — Assessment & Plan Note (Signed)
Check TSH, continue Synthroid. 

## 2014-03-11 NOTE — Assessment & Plan Note (Signed)
Patient has been smoke free for 5 months. Congratulated her success and encouraged her to remain smoke free. Running serum cotinine test today for her insurance company.

## 2014-03-11 NOTE — Patient Instructions (Addendum)
We will check the blood test for your insurance company as well as your routine blood tests today. We will call you with the results.  For your years we will give you eardrops that should help to calm the inflammation in the canals. Use 2 drops 3 times a day for the next week. Try to avoid using Q-tips in your ears or scratching the ear canal.  Come back in about a year. Please feel free to call our office to be of any problem or questions before that time.  Otitis Externa Otitis externa is a bacterial or fungal infection of the outer ear canal. This is the area from the eardrum to the outside of the ear. Otitis externa is sometimes called "swimmer's ear." CAUSES  Possible causes of infection include:  Swimming in dirty water.  Moisture remaining in the ear after swimming or bathing.  Mild injury (trauma) to the ear.  Objects stuck in the ear (foreign body).  Cuts or scrapes (abrasions) on the outside of the ear. SIGNS AND SYMPTOMS  The first symptom of infection is often itching in the ear canal. Later signs and symptoms may include swelling and redness of the ear canal, ear pain, and yellowish-white fluid (pus) coming from the ear. The ear pain may be worse when pulling on the earlobe. DIAGNOSIS  Your health care provider will perform a physical exam. A sample of fluid may be taken from the ear and examined for bacteria or fungi. TREATMENT  Treatment may also include pain medicine or corticosteroids to reduce itching and swelling. HOME CARE INSTRUCTIONS   Apply antibiotic ear drops to the ear canal as prescribed by your health care provider.  Take medicines only as directed by your health care provider.  If you have diabetes, follow any additional treatment instructions from your health care provider.  Keep all follow-up visits as directed by your health care provider. PREVENTION   Keep your ear dry. Use the corner of a towel to absorb water out of the ear canal after swimming  or bathing.  Avoid scratching or putting objects inside your ear. This can damage the ear canal or remove the protective wax that lines the canal. This makes it easier for bacteria and fungi to grow.  Avoid swimming in lakes, polluted water, or poorly chlorinated pools.  You may use ear drops made of rubbing alcohol and vinegar after swimming. Combine equal parts of white vinegar and alcohol in a bottle. Put 3 or 4 drops into each ear after swimming. SEEK MEDICAL CARE IF:   You have a fever.  Your ear is still red, swollen, painful, or draining pus after 3 days.  Your redness, swelling, or pain gets worse.  You have a severe headache.  You have redness, swelling, pain, or tenderness in the area behind your ear. MAKE SURE YOU:   Understand these instructions.  Will watch your condition.  Will get help right away if you are not doing well or get worse. Document Released: 04/17/2005 Document Revised: 09/01/2013 Document Reviewed: 05/04/2011 [facility] Patient Information 2015 [place], [place]. This information is not intended to replace advice given to you by your health care provider. Make sure you discuss any questions you have with your health care provider.

## 2014-03-11 NOTE — Progress Notes (Signed)
   Subjective:    Patient ID: Deborah Stewart, female    DOB: 07/21/1955, 58 y.o.   MRN: [id number]  HPI The patient is a 58 year old female who comes in today to establish care. She has past medical history of hypothyroidism, tobacco abuse, hypertension, hyperlipidemia, back pain. She is now smoke free for 5 months because she was going on trip to [place] with her daughter who does not like cigarette smoke. She denies any chest pains, shortness of breath, abdominal pain. She does have ulcerative colitis for which she follows with the GI doctor. She only occasionally takes her medicine and does not have any symptoms other than frequent bowel movements in the morning. She denies any blood, minimal pain.  Review of Systems  Constitutional: Negative for fever, activity change, appetite change, fatigue and unexpected weight change.  HENT: Negative.   Respiratory: Negative for cough, chest tightness, shortness of breath and wheezing.   Cardiovascular: Negative for chest pain, palpitations and leg swelling.  Gastrointestinal: Negative for abdominal pain, diarrhea, constipation and abdominal distention.       Frequent bowel movements  Musculoskeletal: Negative for myalgias, arthralgias and gait problem.  Skin: Negative.   Neurological: Negative.   Psychiatric/Behavioral: Negative.       Objective:   Physical Exam  Constitutional: She is oriented to person, place, and time. She appears well-developed and well-nourished.  HENT:  Head: Normocephalic and atraumatic.  Eyes: EOM are normal.  Neck: Normal range of motion.  Cardiovascular: Normal rate and regular rhythm.   Pulmonary/Chest: Effort normal and breath sounds normal.  Abdominal: Soft. Bowel sounds are normal. She exhibits no distension. There is no tenderness. There is no rebound.  Neurological: She is alert and oriented to person, place, and time. Coordination normal.  Skin: Skin is warm and dry.   Filed Vitals:   03/11/14 0809  BP:  120/82  Pulse: 80  Temp: 98.2 F (36.8 C)  TempSrc: Oral  Resp: 12  Height: 5\' 4"  (1.626 m)  Weight: 174 lb (78.926 kg)  SpO2: 98%      Assessment & Plan:

## 2014-03-11 NOTE — Assessment & Plan Note (Signed)
She is supposed to be taking Lialda however only takes it occasionally due to cost. She denies any blood in her bowel movements, she does have frequent bowel movements in the morning, she denies pain or cramping.

## 2014-03-11 NOTE — Assessment & Plan Note (Signed)
Check lipid panel today 

## 2014-03-11 NOTE — Assessment & Plan Note (Addendum)
Patient due for colonoscopy in 2017, overdue for mammogram. Does not need Pap smear given hysterectomy for benign cause. Flu shot given today.

## 2014-03-11 NOTE — Progress Notes (Signed)
Pre visit review using our clinic review tool, if applicable. No additional management support is needed unless otherwise documented below in the visit note. 

## 2014-03-11 NOTE — Assessment & Plan Note (Signed)
Not getting much relief from tramadol, will not refill. Will trial Voltaren gel. Continue Flexeril.

## 2014-03-13 LAB — NICOTINE/COTININE METABOLITES: Cotinine: <10 ng/mL

## 2014-03-25 ENCOUNTER — Ambulatory Visit (INDEPENDENT_AMBULATORY_CARE_PROVIDER_SITE_OTHER): Payer: 59 | Admitting: Gastroenterology

## 2014-03-25 ENCOUNTER — Encounter: Payer: Self-pay | Admitting: Gastroenterology

## 2014-03-25 VITALS — BP 140/90 | HR 80 | Ht 64.0 in | Wt 175.0 lb

## 2014-03-25 DIAGNOSIS — Z860101 Personal history of adenomatous and serrated colon polyps: Secondary | ICD-10-CM | POA: Insufficient documentation

## 2014-03-25 DIAGNOSIS — K5010 Crohn's disease of large intestine without complications: Secondary | ICD-10-CM | POA: Insufficient documentation

## 2014-03-25 DIAGNOSIS — Z86010 Personal history of colonic polyps: Secondary | ICD-10-CM

## 2014-03-25 DIAGNOSIS — K5730 Diverticulosis of large intestine without perforation or abscess without bleeding: Secondary | ICD-10-CM

## 2014-03-25 HISTORY — DX: Crohn's disease of large intestine without complications: K50.10

## 2014-03-25 NOTE — Progress Notes (Signed)
    History of Present Illness: This is a 58 year old female previously followed by Dr. Verl Blalock with [facility]. She has been maintained on Lialda but recently has not been taking it regularly due to cost. She has episodic diarrhea that also responds to hyoscyamine. BMET obtained earlier this month was unremarkable.  Current Medications, Allergies, Past Medical History, Past Surgical History, Family History and Social History were reviewed in [employer] record.  Physical Exam: General: Well developed , well nourished, no acute distress Head: Normocephalic and atraumatic Eyes:  sclerae anicteric, EOMI Ears: Normal auditory acuity Mouth: No deformity or lesions Lungs: Clear throughout to auscultation Heart: Regular rate and rhythm; no murmurs, rubs or bruits Abdomen: Soft, non tender and non distended. No masses, hepatosplenomegaly or hernias noted. Normal Bowel sounds Musculoskeletal: Symmetrical with no gross deformities  Pulses:  Normal pulses noted Extremities: No clubbing, cyanosis, edema or deformities noted Neurological: Alert oriented x 4, grossly nonfocal Psychological:  Alert and cooperative. Normal mood and affect  Assessment and Recommendations:  1. [facility]. Renew Lialda 2.4 g daily with discount coupon. Continue hyoscyamine as needed. REV in 1 year.  2. Personal history of adenomatous colon polyps. Surveillance colonoscopy 06/2015.

## 2014-03-25 NOTE — Patient Instructions (Signed)
Follow up in 1 year Your recall colonoscopy is in 2017 with Dr Fuller Plan We are giving you Lialda samples today  We are also giving you a discount card today for Lialda

## 2014-05-19 ENCOUNTER — Encounter: Payer: Self-pay | Admitting: Internal Medicine

## 2014-05-19 ENCOUNTER — Ambulatory Visit (INDEPENDENT_AMBULATORY_CARE_PROVIDER_SITE_OTHER): Payer: 59 | Admitting: Internal Medicine

## 2014-05-19 VITALS — BP 122/84 | HR 94 | Temp 98.5°F | Resp 16 | Ht 65.0 in | Wt 183.1 lb

## 2014-05-19 DIAGNOSIS — T2122XA Burn of second degree of abdominal wall, initial encounter: Secondary | ICD-10-CM

## 2014-05-19 MED ORDER — HYDROCODONE-ACETAMINOPHEN 5-325 MG PO TABS: 1.0000 | ORAL_TABLET | Freq: Four times a day (QID) | ORAL | Status: DC | PRN

## 2014-05-19 NOTE — Assessment & Plan Note (Signed)
Patient still on bactrim for 3 more days, do not feel additional antibiotics necessary, refer to wound clinic for treatment. Refill her vicodin for pain.
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
## 2014-05-19 NOTE — Progress Notes (Signed)
Pre visit review using our clinic review tool, if applicable. No additional management support is needed unless otherwise documented below in the visit note. 

## 2014-06-04 ENCOUNTER — Encounter (HOSPITAL_BASED_OUTPATIENT_CLINIC_OR_DEPARTMENT_OTHER): Payer: 59

## 2015-04-06 ENCOUNTER — Encounter: Payer: Self-pay | Admitting: Internal Medicine

## 2015-04-06 ENCOUNTER — Ambulatory Visit (INDEPENDENT_AMBULATORY_CARE_PROVIDER_SITE_OTHER): Payer: 59 | Admitting: Internal Medicine

## 2015-04-06 VITALS — BP 152/100 | HR 79 | Temp 98.6°F | Resp 14 | Ht 64.5 in | Wt 173.0 lb

## 2015-04-06 DIAGNOSIS — I10 Essential (primary) hypertension: Secondary | ICD-10-CM | POA: Diagnosis not present

## 2015-04-06 DIAGNOSIS — K5010 Crohn's disease of large intestine without complications: Secondary | ICD-10-CM | POA: Diagnosis not present

## 2015-04-06 DIAGNOSIS — Z0000 Encounter for general adult medical examination without abnormal findings: Secondary | ICD-10-CM | POA: Diagnosis not present

## 2015-04-06 DIAGNOSIS — E039 Hypothyroidism, unspecified: Secondary | ICD-10-CM | POA: Diagnosis not present

## 2015-04-06 DIAGNOSIS — E785 Hyperlipidemia, unspecified: Secondary | ICD-10-CM

## 2015-04-06 MED ORDER — HYDROCHLOROTHIAZIDE 12.5 MG PO CAPS: 12.5000 mg | ORAL_CAPSULE | Freq: Every day | ORAL | Status: DC

## 2015-04-06 MED ORDER — ESTRADIOL 0.1 MG/GM VA CREA: TOPICAL_CREAM | VAGINAL | Status: DC

## 2015-04-06 MED ORDER — CYCLOBENZAPRINE HCL 10 MG PO TABS: 10.0000 mg | ORAL_TABLET | Freq: Three times a day (TID) | ORAL | Status: DC | PRN

## 2015-04-06 MED ORDER — VARENICLINE TARTRATE 1 MG PO TABS: 1.0000 mg | ORAL_TABLET | Freq: Two times a day (BID) | ORAL | Status: DC

## 2015-04-06 NOTE — Progress Notes (Signed)
   Subjective:    Patient ID: Deborah Stewart, female    DOB: April 10, 1956, 59 y.o.   MRN: [id number]  HPI The patient is a 59 YO female coming in for wellness. Some new stressors which has caused more eating and more weight gain. No new chest pains, SOB.   PMH, [facility], social history reviewed and updated.   Review of Systems  Constitutional: Negative for fever, activity change, appetite change, fatigue and unexpected weight change.  HENT: Negative.   Respiratory: Negative for cough, chest tightness, shortness of breath and wheezing.   Cardiovascular: Negative for chest pain, palpitations and leg swelling.  Gastrointestinal: Negative for abdominal pain, diarrhea, constipation and abdominal distention.       Frequent bowel movements  Musculoskeletal: Negative for myalgias, arthralgias and gait problem.  Skin: Negative.   Neurological: Negative.   Psychiatric/Behavioral: Positive for dysphoric mood. The patient is nervous/anxious.       Objective:   Physical Exam  Constitutional: She is oriented to person, place, and time. She appears well-developed and well-nourished.  HENT:  Head: Normocephalic and atraumatic.  Eyes: EOM are normal.  Neck: Normal range of motion.  Cardiovascular: Normal rate and regular rhythm.   Pulmonary/Chest: Effort normal and breath sounds normal. No respiratory distress. She has no wheezes. She has no rales.  Abdominal: Soft. Bowel sounds are normal. She exhibits no distension. There is no tenderness. There is no rebound.  Neurological: She is alert and oriented to person, place, and time. Coordination normal.  Skin: Skin is warm and dry.   Filed Vitals:   04/06/15 0930  BP: 152/100  Pulse: 79  Temp: 98.6 F (37 C)  TempSrc: Oral  Resp: 14  Height: 5' 4.5" (1.638 m)  Weight: 173 lb (78.472 kg)  SpO2: 98%      Assessment & Plan:

## 2015-04-06 NOTE — Assessment & Plan Note (Signed)
Checking TSH and free T4 today. Compliance is good with her synthroid 100 mcg daily. Continue and adjust as needed.

## 2015-04-06 NOTE — Assessment & Plan Note (Signed)
Off lialda right now due to cost. Doing okay with minimal diarrhea symptoms. Worsened with certain foods. Needs GI follow up soon.

## 2015-04-06 NOTE — Patient Instructions (Signed)
We have sent in hctz 12.5 mg for you to start taking for the blood pressure.   We will put in the orders for the labs and you can come back in a couple weeks for the labs. Come before you eat in the morning. The lab opens at 7:30 AM M-F.   Think about working on exercising 3-4 times a week to help with stress and worry.   Stress and Stress Management Stress is a normal reaction to life events. It is what you feel when life demands more than you are used to or more than you can handle. Some stress can be useful. For example, the stress reaction can help you catch the last bus of the day, study for a test, or meet a deadline at work. But stress that occurs too often or for too long can cause problems. It can affect your emotional health and interfere with relationships and normal daily activities. Too much stress can weaken your immune system and increase your risk for physical illness. If you already have a medical problem, stress can make it worse. CAUSES  All sorts of life events may cause stress. An event that causes stress for one person may not be stressful for another person. Major life events commonly cause stress. These may be positive or negative. Examples include losing your job, moving into a new home, getting married, having a baby, or losing a loved one. Less obvious life events may also cause stress, especially if they occur day after day or in combination. Examples include working long hours, driving in traffic, caring for children, being in debt, or being in a difficult relationship. SIGNS AND SYMPTOMS Stress may cause emotional symptoms including, the following:  Anxiety. This is feeling worried, afraid, on edge, overwhelmed, or out of control.  Anger. This is feeling irritated or impatient.  Depression. This is feeling sad, down, helpless, or guilty.  Difficulty focusing, remembering, or making decisions. Stress may cause physical symptoms, including the following:   Aches and  pains. These may affect your head, neck, back, stomach, or other areas of your body.  Tight muscles or clenched jaw.  Low energy or trouble sleeping. Stress may cause unhealthy behaviors, including the following:   Eating to feel better (overeating) or skipping meals.  Sleeping too little, too much, or both.  Working too much or putting off tasks (procrastination).  Smoking, drinking alcohol, or using drugs to feel better. DIAGNOSIS  Stress is diagnosed through an assessment by your health care provider. Your health care provider will ask questions about your symptoms and any stressful life events.Your health care provider will also ask about your medical history and may order blood tests or other tests. Certain medical conditions and medicine can cause physical symptoms similar to stress. Mental illness can cause emotional symptoms and unhealthy behaviors similar to stress. Your health care provider may refer you to a mental health professional for further evaluation.  TREATMENT  Stress management is the recommended treatment for stress.The goals of stress management are reducing stressful life events and coping with stress in healthy ways.  Techniques for reducing stressful life events include the following:  Stress identification. Self-monitor for stress and identify what causes stress for you. These skills may help you to avoid some stressful events.  Time management. Set your priorities, keep a calendar of events, and learn to say "no." These tools can help you avoid making too many commitments. Techniques for coping with stress include the following:  Rethinking the  problem. Try to think realistically about stressful events rather than ignoring them or overreacting. Try to find the positives in a stressful situation rather than focusing on the negatives.  Exercise. Physical exercise can release both physical and emotional tension. The key is to find a form of exercise you enjoy  and do it regularly.  Relaxation techniques. These relax the body and mind. Examples include yoga, meditation, tai chi, biofeedback, deep breathing, progressive muscle relaxation, listening to music, being out in nature, journaling, and other hobbies. Again, the key is to find one or more that you enjoy and can do regularly.  Healthy lifestyle. Eat a balanced diet, get plenty of sleep, and do not smoke. Avoid using alcohol or drugs to relax.  Strong support network. Spend time with family, friends, or other people you enjoy being around.Express your feelings and talk things over with someone you trust. Counseling or talktherapy with a mental health professional may be helpful if you are having difficulty managing stress on your own. Medicine is typically not recommended for the treatment of stress.Talk to your health care provider if you think you need medicine for symptoms of stress. HOME CARE INSTRUCTIONS  Keep all follow-up visits as directed by your health care provider.  Take all medicines as directed by your health care provider. SEEK MEDICAL CARE IF:  Your symptoms get worse or you start having new symptoms.  You feel overwhelmed by your problems and can no longer manage them on your own. SEEK IMMEDIATE MEDICAL CARE IF:  You feel like hurting yourself or someone else.   This information is not intended to replace advice given to you by your health care provider. Make sure you discuss any questions you have with your health care provider.   Document Released: 10/11/2000 Document Revised: 05/08/2014 Document Reviewed: 12/10/2012 Elsevier Interactive Patient Education [employer].

## 2015-04-06 NOTE — Assessment & Plan Note (Signed)
Total cholesterol was 210 per her reports this summer with work testing. Checking cholesterol with labs in several weeks.

## 2015-04-06 NOTE — Assessment & Plan Note (Signed)
BP elevated due to weight gain and will restart hctz 12.5 mg daily. Checking BMP several weeks after starting.

## 2015-04-06 NOTE — Progress Notes (Signed)
Pre visit review using our clinic review tool, if applicable. No additional management support is needed unless otherwise documented below in the visit note. 

## 2015-04-06 NOTE — Assessment & Plan Note (Addendum)
Needs to go back to Ob/Gyn for pap smear (abnormal in the past). Checking labs today, BP above goal so restarting treatment. Needs mammogram which she defers today. Colonoscopy up to date and declines flu shot today.

## 2015-04-20 ENCOUNTER — Other Ambulatory Visit: Payer: Self-pay | Admitting: Internal Medicine

## 2015-06-10 ENCOUNTER — Encounter: Payer: Self-pay | Admitting: Gastroenterology

## 2015-06-17 ENCOUNTER — Encounter: Payer: Self-pay | Admitting: Gastroenterology

## 2015-07-13 ENCOUNTER — Other Ambulatory Visit: Payer: Self-pay | Admitting: Obstetrics and Gynecology

## 2015-07-13 DIAGNOSIS — N6489 Other specified disorders of breast: Secondary | ICD-10-CM

## 2015-07-15 DIAGNOSIS — M25512 Pain in left shoulder: Secondary | ICD-10-CM

## 2015-07-15 DIAGNOSIS — M25562 Pain in left knee: Secondary | ICD-10-CM

## 2015-07-15 DIAGNOSIS — G8929 Other chronic pain: Secondary | ICD-10-CM

## 2015-07-15 HISTORY — DX: Other chronic pain: G89.29

## 2015-07-15 HISTORY — DX: Pain in left knee: M25.562

## 2015-07-16 ENCOUNTER — Other Ambulatory Visit: Payer: 59

## 2015-07-28 ENCOUNTER — Inpatient Hospital Stay
Admission: RE | Admit: 2015-07-28 | Discharge: 2015-07-28 | Disposition: A | Discharge status: Home / Self Care | Payer: Self-pay | Source: Ambulatory Visit | Attending: *Deleted | Admitting: *Deleted

## 2015-07-28 ENCOUNTER — Other Ambulatory Visit: Payer: Self-pay | Admitting: *Deleted

## 2015-07-28 DIAGNOSIS — Z9289 Personal history of other medical treatment: Secondary | ICD-10-CM

## 2015-07-29 DIAGNOSIS — L84 Corns and callosities: Secondary | ICD-10-CM | POA: Insufficient documentation

## 2015-07-30 ENCOUNTER — Other Ambulatory Visit: Payer: Self-pay | Admitting: Internal Medicine

## 2015-08-04 ENCOUNTER — Ambulatory Visit (AMBULATORY_SURGERY_CENTER): Payer: Self-pay

## 2015-08-04 VITALS — Ht 64.5 in | Wt 176.0 lb

## 2015-08-04 DIAGNOSIS — Z860100 Personal history of colon polyps, unspecified: Secondary | ICD-10-CM

## 2015-08-04 MED ORDER — SUPREP BOWEL PREP KIT 17.5-3.13-1.6 GM/177ML PO SOLN: 1.0000 | Freq: Once | ORAL | Status: DC

## 2015-08-04 NOTE — Progress Notes (Signed)
No allergies to eggs or soy No diet/weight loss meds No past problems with anesthesia No home oxygen  Has email and internet; registered for emmi 

## 2015-08-05 ENCOUNTER — Other Ambulatory Visit: Payer: Self-pay | Admitting: Gastroenterology

## 2015-08-18 ENCOUNTER — Encounter: Payer: 59 | Admitting: Gastroenterology

## 2015-08-23 ENCOUNTER — Other Ambulatory Visit: Payer: Self-pay

## 2015-08-27 ENCOUNTER — Encounter: Payer: 59 | Admitting: Gastroenterology

## 2015-09-06 ENCOUNTER — Ambulatory Visit: Payer: 59

## 2015-09-06 ENCOUNTER — Other Ambulatory Visit: Payer: 59

## 2015-09-16 ENCOUNTER — Other Ambulatory Visit (INDEPENDENT_AMBULATORY_CARE_PROVIDER_SITE_OTHER): Payer: 59

## 2015-09-16 DIAGNOSIS — Z0000 Encounter for general adult medical examination without abnormal findings: Secondary | ICD-10-CM | POA: Diagnosis not present

## 2015-09-16 DIAGNOSIS — E039 Hypothyroidism, unspecified: Secondary | ICD-10-CM

## 2015-09-16 LAB — LIPID PANEL
Cholesterol: 194 mg/dL (ref 0–200)
HDL: 33.1 mg/dL — ABNORMAL LOW (ref >39.00)
LDL Cholesterol: 132 mg/dL — ABNORMAL HIGH (ref 0–99)
NonHDL: 160.41
Total CHOL/HDL Ratio: 6
Triglycerides: 144 mg/dL (ref 0.0–149.0)
VLDL: 28.8 mg/dL (ref 0.0–40.0)

## 2015-09-16 LAB — COMPREHENSIVE METABOLIC PANEL
ALT: 10 U/L (ref 0–35)
AST: 14 U/L (ref 0–37)
Albumin: 4.3 g/dL (ref 3.5–5.2)
Alkaline Phosphatase: 70 U/L (ref 39–117)
BUN: 10 mg/dL (ref 6–23)
CO2: 28 mEq/L (ref 19–32)
Calcium: 9.4 mg/dL (ref 8.4–10.5)
Chloride: 105 mEq/L (ref 96–112)
Creatinine, Ser: 0.73 mg/dL (ref 0.40–1.20)
GFR: 104.71 mL/min (ref >60.00)
Glucose, Bld: 85 mg/dL (ref 70–99)
Potassium: 3.4 mEq/L — ABNORMAL LOW (ref 3.5–5.1)
Sodium: 140 mEq/L (ref 135–145)
Total Bilirubin: 0.4 mg/dL (ref 0.2–1.2)
Total Protein: 7.3 g/dL (ref 6.0–8.3)

## 2015-09-16 LAB — TSH: TSH: 2.4 u[IU]/mL (ref 0.35–4.50)

## 2015-09-16 LAB — T4, FREE: Free T4: 0.72 ng/dL (ref 0.60–1.60)

## 2015-09-23 ENCOUNTER — Other Ambulatory Visit: Payer: Self-pay | Admitting: Gastroenterology

## 2015-10-05 ENCOUNTER — Ambulatory Visit: Payer: 59 | Admitting: Internal Medicine

## 2015-10-12 ENCOUNTER — Other Ambulatory Visit: Payer: Self-pay | Admitting: Gastroenterology

## 2015-10-12 MED ORDER — HYOSCYAMINE SULFATE 0.125 MG SL SUBL: 0.1250 mg | SUBLINGUAL_TABLET | Freq: Four times a day (QID) | SUBLINGUAL | Status: DC | PRN

## 2015-10-12 NOTE — Telephone Encounter (Signed)
Hyoscyamine has been sent to patient's pharmacy.

## 2015-10-18 ENCOUNTER — Encounter: Payer: Self-pay | Admitting: Internal Medicine

## 2015-10-18 ENCOUNTER — Other Ambulatory Visit: Payer: 59

## 2015-10-18 ENCOUNTER — Ambulatory Visit (INDEPENDENT_AMBULATORY_CARE_PROVIDER_SITE_OTHER): Payer: 59 | Admitting: Internal Medicine

## 2015-10-18 VITALS — BP 138/60 | HR 82 | Temp 98.5°F | Resp 16 | Ht 65.0 in | Wt 174.0 lb

## 2015-10-18 DIAGNOSIS — Z0000 Encounter for general adult medical examination without abnormal findings: Secondary | ICD-10-CM

## 2015-10-18 DIAGNOSIS — Z1159 Encounter for screening for other viral diseases: Secondary | ICD-10-CM

## 2015-10-18 DIAGNOSIS — E785 Hyperlipidemia, unspecified: Secondary | ICD-10-CM

## 2015-10-18 DIAGNOSIS — E039 Hypothyroidism, unspecified: Secondary | ICD-10-CM | POA: Diagnosis not present

## 2015-10-18 LAB — HEPATITIS C ANTIBODY: HCV Ab: NEGATIVE

## 2015-10-18 LAB — HIV ANTIBODY (ROUTINE TESTING W REFLEX): HIV 1&2 Ab, 4th Generation: NONREACTIVE

## 2015-10-18 MED ORDER — LEVOTHYROXINE SODIUM 100 MCG PO TABS: ORAL_TABLET | ORAL | Status: DC

## 2015-10-18 MED ORDER — ESTRADIOL 0.5 MG PO TABS: 0.5000 mg | ORAL_TABLET | Freq: Every day | ORAL | Status: DC

## 2015-10-18 NOTE — Progress Notes (Signed)
Pre visit review using our clinic review tool, if applicable. No additional management support is needed unless otherwise documented below in the visit note. 

## 2015-10-18 NOTE — Patient Instructions (Addendum)
I have sent in the estrogen pills that you should be able to get or use good rx for to get cheap.   The labs are good and you do not need to change anything today.   Blase Mess today is the life coaching business we talked about.

## 2015-10-19 NOTE — Assessment & Plan Note (Signed)
Reviewed last labs with her and no indication for change to her levothyroxine 100 mcg daily.

## 2015-10-19 NOTE — Assessment & Plan Note (Signed)
Cholesterol is to goal with lifestyle changes. She is congratulated and advised to continue the good work.

## 2015-10-19 NOTE — Progress Notes (Signed)
   Subjective:    Patient ID: Deborah Stewart, female    DOB: 25-Oct-1955, 60 y.o.   MRN: [id number]  HPI The patient is a 60 YO female coming in for follow up of her cholesterol (labs doing better last month, she is working on her diet and her exercise). She is dealing with some stress because she lost her job back in February and has not found another job at a comparable income. She is also following up on her thyroid levels to make sure they are doing well. Denies any new symptoms such as hot or cold, diarrhea or constipation.   Review of Systems  Constitutional: Negative for fever, activity change, appetite change, fatigue and unexpected weight change.  Respiratory: Negative for cough, chest tightness, shortness of breath and wheezing.   Cardiovascular: Negative for chest pain, palpitations and leg swelling.  Gastrointestinal: Negative for abdominal pain, diarrhea, constipation and abdominal distention.       Frequent bowel movements  Musculoskeletal: Negative for myalgias, arthralgias and gait problem.  Skin: Negative.   Neurological: Negative.   Psychiatric/Behavioral: Positive for dysphoric mood. The patient is nervous/anxious.       Objective:   Physical Exam  Constitutional: She is oriented to person, place, and time. She appears well-developed and well-nourished.  HENT:  Head: Normocephalic and atraumatic.  Eyes: EOM are normal.  Neck: Normal range of motion.  Cardiovascular: Normal rate and regular rhythm.   Pulmonary/Chest: Effort normal and breath sounds normal. No respiratory distress. She has no wheezes. She has no rales.  Abdominal: Soft. Bowel sounds are normal. She exhibits no distension. There is no tenderness. There is no rebound.  Neurological: She is alert and oriented to person, place, and time. Coordination normal.  Skin: Skin is warm and dry.   Filed Vitals:   10/18/15 1352  BP: 138/60  Pulse: 82  Temp: 98.5 F (36.9 C)  TempSrc: Oral  Resp: 16    Height: 5\' 5"  (1.651 m)  Weight: 174 lb (78.926 kg)  SpO2: 96%      Assessment & Plan:

## 2015-11-19 ENCOUNTER — Other Ambulatory Visit: Payer: Self-pay | Admitting: *Deleted

## 2015-11-19 MED ORDER — VARENICLINE TARTRATE 1 MG PO TABS: 1.0000 mg | ORAL_TABLET | Freq: Two times a day (BID) | ORAL | Status: DC

## 2015-12-01 ENCOUNTER — Encounter: Payer: Self-pay | Admitting: Gastroenterology

## 2015-12-01 ENCOUNTER — Ambulatory Visit (INDEPENDENT_AMBULATORY_CARE_PROVIDER_SITE_OTHER): Payer: 59 | Admitting: Gastroenterology

## 2015-12-01 VITALS — BP 108/70 | HR 72 | Ht 65.0 in | Wt 171.0 lb

## 2015-12-01 DIAGNOSIS — Z86010 Personal history of colonic polyps: Secondary | ICD-10-CM

## 2015-12-01 DIAGNOSIS — K589 Irritable bowel syndrome without diarrhea: Secondary | ICD-10-CM | POA: Diagnosis not present

## 2015-12-01 DIAGNOSIS — K5010 Crohn's disease of large intestine without complications: Secondary | ICD-10-CM | POA: Diagnosis not present

## 2015-12-01 MED ORDER — HYOSCYAMINE SULFATE 0.125 MG SL SUBL: 0.1250 mg | SUBLINGUAL_TABLET | Freq: Four times a day (QID) | SUBLINGUAL | 11 refills | Status: DC | PRN

## 2015-12-01 MED ORDER — MESALAMINE 1.2 G PO TBEC: DELAYED_RELEASE_TABLET | ORAL | 11 refills | Status: DC

## 2015-12-01 NOTE — Patient Instructions (Addendum)
We have printed the following medications to take to your pharmacy: [facility].   We also have given you samples of Lialda in the office today.   Thank you for choosing me and [facility].  Pricilla Riffle. Dagoberto Ligas., MD., Marval Regal

## 2015-12-01 NOTE — Progress Notes (Signed)
    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 She has not been able to afford her Lialda as she no longer has health insurance coverage for this medication. In fact she states she will lose all health insurance this week. She takes hyoscyamine as needed which is helpful for intermittent abdominal cramping and loose stools. She is overdue for recommended five-year surveillance colonoscopy.  Current Medications, Allergies, Past Medical History, Past Surgical History, Family History and Social History were reviewed in [employer] record.  Physical Exam: General: Well developed, well nourished, no acute distress Head: Normocephalic and atraumatic Eyes:  sclerae anicteric, EOMI Ears: Normal auditory acuity Mouth: No deformity or lesions Lungs: Clear throughout to auscultation Heart: Regular rate and rhythm; no murmurs, rubs or bruits Abdomen: Soft, non tender and non distended. No masses, hepatosplenomegaly or hernias noted. Normal Bowel sounds Rectal: deferred to colonoscopy Musculoskeletal: Symmetrical with no gross deformities  Pulses:  Normal pulses noted Extremities: No clubbing, cyanosis, edema or deformities noted Neurological: Alert oriented x 4, grossly nonfocal Psychological:  Alert and cooperative. Normal mood and affect  Assessment and Recommendations:  1. SCAD. Lialda 2.4 g daily. She is looking into assistance programs to be able to obtain this medication. REV in 1 year.   2. IBS. Avoid foods that trigger symptoms. Hyoscyamine 1-2 q4h prn.   3. Personal history of adenomatous colon polyps. Due for 5 year interval colonoscopy however she has lost her insurance coverage and does not want to proceed until she has insurance coverage. Advised to call if she obtains insurance. Change recall colonoscopy to 06/2016.

## 2016-05-25 ENCOUNTER — Other Ambulatory Visit: Payer: Self-pay | Admitting: *Deleted

## 2016-05-25 MED ORDER — LEVOTHYROXINE SODIUM 100 MCG PO TABS: ORAL_TABLET | ORAL | 1 refills | Status: DC

## 2016-05-25 MED ORDER — CYCLOBENZAPRINE HCL 10 MG PO TABS: 10.0000 mg | ORAL_TABLET | Freq: Three times a day (TID) | ORAL | 0 refills | Status: DC | PRN

## 2016-08-28 DIAGNOSIS — M810 Age-related osteoporosis without current pathological fracture: Secondary | ICD-10-CM | POA: Insufficient documentation

## 2016-08-28 HISTORY — DX: Age-related osteoporosis without current pathological fracture: M81.0

## 2016-08-30 DIAGNOSIS — B009 Herpesviral infection, unspecified: Secondary | ICD-10-CM | POA: Insufficient documentation

## 2016-08-30 DIAGNOSIS — E559 Vitamin D deficiency, unspecified: Secondary | ICD-10-CM

## 2016-08-30 HISTORY — DX: Herpesviral infection, unspecified: B00.9

## 2016-08-30 HISTORY — DX: Vitamin D deficiency, unspecified: E55.9

## 2016-11-24 ENCOUNTER — Other Ambulatory Visit: Payer: Self-pay | Admitting: Internal Medicine

## 2016-12-28 ENCOUNTER — Other Ambulatory Visit: Payer: Self-pay | Admitting: Internal Medicine

## 2017-01-16 ENCOUNTER — Encounter: Payer: Self-pay | Admitting: Internal Medicine

## 2017-01-16 ENCOUNTER — Ambulatory Visit (INDEPENDENT_AMBULATORY_CARE_PROVIDER_SITE_OTHER): Payer: BLUE CROSS/BLUE SHIELD | Admitting: Internal Medicine

## 2017-01-16 VITALS — BP 140/90 | HR 72 | Temp 98.5°F | Ht 65.0 in | Wt 174.0 lb

## 2017-01-16 DIAGNOSIS — F419 Anxiety disorder, unspecified: Secondary | ICD-10-CM | POA: Diagnosis not present

## 2017-01-16 DIAGNOSIS — E039 Hypothyroidism, unspecified: Secondary | ICD-10-CM

## 2017-01-16 DIAGNOSIS — I10 Essential (primary) hypertension: Secondary | ICD-10-CM

## 2017-01-16 DIAGNOSIS — R002 Palpitations: Secondary | ICD-10-CM | POA: Diagnosis not present

## 2017-01-16 MED ORDER — BUSPIRONE HCL 5 MG PO TABS: 5.0000 mg | ORAL_TABLET | Freq: Two times a day (BID) | ORAL | 2 refills | Status: DC | PRN

## 2017-01-16 NOTE — Patient Instructions (Signed)
We will get you set up for the stress test for the heart.   We have sent in buspar to use when needed for the stress.    Stress and Stress Management Stress is a normal reaction to life events. It is what you feel when life demands more than you are used to or more than you can handle. Some stress can be useful. For example, the stress reaction can help you catch the last bus of the day, study for a test, or meet a deadline at work. But stress that occurs too often or for too long can cause problems. It can affect your emotional health and interfere with relationships and normal daily activities. Too much stress can weaken your immune system and increase your risk for physical illness. If you already have a medical problem, stress can make it worse. What are the causes? All sorts of life events may cause stress. An event that causes stress for one person may not be stressful for another person. Major life events commonly cause stress. These may be positive or negative. Examples include losing your job, moving into a new home, getting married, having a baby, or losing a loved one. Less obvious life events may also cause stress, especially if they occur day after day or in combination. Examples include working long hours, driving in traffic, caring for children, being in debt, or being in a difficult relationship. What are the signs or symptoms? Stress may cause emotional symptoms including, the following:  Anxiety. This is feeling worried, afraid, on edge, overwhelmed, or out of control.  Anger. This is feeling irritated or impatient.  Depression. This is feeling sad, down, helpless, or guilty.  Difficulty focusing, remembering, or making decisions.  Stress may cause physical symptoms, including the following:  Aches and pains. These may affect your head, neck, back, stomach, or other areas of your body.  Tight muscles or clenched jaw.  Low energy or trouble sleeping.  Stress may cause  unhealthy behaviors, including the following:  Eating to feel better (overeating) or skipping meals.  Sleeping too little, too much, or both.  Working too much or putting off tasks (procrastination).  Smoking, drinking alcohol, or using drugs to feel better.  How is this diagnosed? Stress is diagnosed through an assessment by your health care provider. Your health care provider will ask questions about your symptoms and any stressful life events.Your health care provider will also ask about your medical history and may order blood tests or other tests. Certain medical conditions and medicine can cause physical symptoms similar to stress. Mental illness can cause emotional symptoms and unhealthy behaviors similar to stress. Your health care provider may refer you to a mental health professional for further evaluation. How is this treated? Stress management is the recommended treatment for stress.The goals of stress management are reducing stressful life events and coping with stress in healthy ways. Techniques for reducing stressful life events include the following:  Stress identification. Self-monitor for stress and identify what causes stress for you. These skills may help you to avoid some stressful events.  Time management. Set your priorities, keep a calendar of events, and learn to say "no." These tools can help you avoid making too many commitments.  Techniques for coping with stress include the following:  Rethinking the problem. Try to think realistically about stressful events rather than ignoring them or overreacting. Try to find the positives in a stressful situation rather than focusing on the negatives.  Exercise. Physical exercise  can release both physical and emotional tension. The key is to find a form of exercise you enjoy and do it regularly.  Relaxation techniques. These relax the body and mind. Examples include yoga, meditation, tai chi, biofeedback, deep breathing,  progressive muscle relaxation, listening to music, being out in nature, journaling, and other hobbies. Again, the key is to find one or more that you enjoy and can do regularly.  Healthy lifestyle. Eat a balanced diet, get plenty of sleep, and do not smoke. Avoid using alcohol or drugs to relax.  Strong support network. Spend time with family, friends, or other people you enjoy being around.Express your feelings and talk things over with someone you trust.  Counseling or talktherapy with a mental health professional may be helpful if you are having difficulty managing stress on your own. Medicine is typically not recommended for the treatment of stress.Talk to your health care provider if you think you need medicine for symptoms of stress. Follow these instructions at home:  Keep all follow-up visits as directed by your health care provider.  Take all medicines as directed by your health care provider. Contact a health care provider if:  Your symptoms get worse or you start having new symptoms.  You feel overwhelmed by your problems and can no longer manage them on your own. Get help right away if:  You feel like hurting yourself or someone else. This information is not intended to replace advice given to you by your health care provider. Make sure you discuss any questions you have with your health care provider. Document Released: 10/11/2000 Document Revised: 09/23/2015 Document Reviewed: 12/10/2012 Elsevier Interactive Patient Education  2017 [employer].

## 2017-01-16 NOTE — Assessment & Plan Note (Signed)
Rx for buspar prn usage. Talked to her about stress management. She does not have SI/HI and does not want chronic medication for her problems. We talked about taking some action steps to help alleviate some of her financial stressors.

## 2017-01-16 NOTE — Progress Notes (Signed)
   Subjective:    Patient ID: Deborah Stewart, female    DOB: 1956-02-27, 61 y.o.   MRN: [id number]  HPI The patient is a 61 YO female coming in for ER follow up (having chest pains and increasing stress and diagnosed with panic attacks, has been seen several times for same recently, lot of family stress). She had another episode last night with chest tightness, arm weakness (left arm) which can last up to hours. 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 Denies syncope, palpitations. She does not have a lot of help as she does not like to get help from others or burden others. She also needs follow up of her thyroid (no labs in >1 year and taking synthroid 100 mcg daily, no fatigue or cold or heat intolerance). She is concerned about heart disease as she has blood pressure problems, cholesterol problems, still smoking.   PMH, [facility], social history reviewed and updated.   Review of Systems  Constitutional: Positive for activity change and appetite change. Negative for chills, fatigue, fever and unexpected weight change.  HENT: Negative.   Eyes: Negative.   Respiratory: Negative.   Cardiovascular: Positive for chest pain and palpitations. Negative for leg swelling.  Gastrointestinal: Negative.   Musculoskeletal: Negative.   Skin: Negative.   Neurological: Negative.   Psychiatric/Behavioral: Positive for decreased concentration, dysphoric mood and sleep disturbance. Negative for self-injury and suicidal ideas. The patient is nervous/anxious.       Objective:   Physical Exam  Constitutional: She is oriented to person, place, and time. She appears well-developed and well-nourished.  HENT:  Head: Normocephalic and atraumatic.  Eyes: EOM are normal.  Neck: Normal range of motion.  Cardiovascular: Normal rate and regular rhythm.   Pulmonary/Chest: Effort normal and breath sounds normal.  Abdominal: Soft.  She exhibits no distension. There is no tenderness. There is no rebound.  Musculoskeletal: She exhibits no edema.  Neurological: She is alert and oriented to person, place, and time.  Skin: Skin is warm and dry.  Psychiatric:  Distraught during the visit.    Vitals:   01/16/17 0835  BP: 140/90  Pulse: 72  Temp: 98.5 F (36.9 C)  TempSrc: Oral  SpO2: 100%  Weight: 174 lb (78.9 kg)  Height: 5\' 5"  (1.651 m)      Assessment & Plan:

## 2017-01-16 NOTE — Assessment & Plan Note (Signed)
Not taking hctz and BP borderline high today but she does not want to make any changes.

## 2017-01-16 NOTE — Assessment & Plan Note (Signed)
Checking TSH and adjust synthroid 100 mcg daily and adjust as needed.

## 2017-01-16 NOTE — Assessment & Plan Note (Signed)
Ordered exercise stress test today to rule out ischemia. No acute changes and recent evaluation at the ER. She declines medication for stress long term.

## 2017-01-18 ENCOUNTER — Ambulatory Visit (HOSPITAL_COMMUNITY)
Admission: RE | Admit: 2017-01-18 | Discharge: 2017-01-18 | Disposition: A | Discharge status: Home / Self Care | Payer: BLUE CROSS/BLUE SHIELD | Source: Ambulatory Visit | Attending: Cardiology | Admitting: Cardiology

## 2017-01-18 DIAGNOSIS — R002 Palpitations: Secondary | ICD-10-CM | POA: Insufficient documentation

## 2017-01-18 LAB — EXERCISE TOLERANCE TEST
Estimated workload: 6.9 METS
Exercise duration (min): 4 min
Exercise duration (sec): 57 s
MPHR: 160 {beats}/min
Peak HR: 133 {beats}/min
Percent HR: 83 %
RPE: 18
Rest HR: 71 {beats}/min

## 2017-01-19 ENCOUNTER — Other Ambulatory Visit: Payer: Self-pay | Admitting: Internal Medicine

## 2017-01-19 DIAGNOSIS — R9439 Abnormal result of other cardiovascular function study: Secondary | ICD-10-CM

## 2017-02-01 ENCOUNTER — Encounter: Payer: Self-pay | Admitting: Cardiology

## 2017-02-01 ENCOUNTER — Ambulatory Visit (HOSPITAL_BASED_OUTPATIENT_CLINIC_OR_DEPARTMENT_OTHER)
Admission: RE | Admit: 2017-02-01 | Discharge: 2017-02-01 | Disposition: A | Discharge status: Home / Self Care | Payer: BLUE CROSS/BLUE SHIELD | Source: Ambulatory Visit | Attending: Cardiology | Admitting: Cardiology

## 2017-02-01 ENCOUNTER — Ambulatory Visit (INDEPENDENT_AMBULATORY_CARE_PROVIDER_SITE_OTHER): Payer: BLUE CROSS/BLUE SHIELD | Admitting: Cardiology

## 2017-02-01 VITALS — BP 126/82 | HR 73 | Ht 64.5 in | Wt 176.0 lb

## 2017-02-01 DIAGNOSIS — I209 Angina pectoris, unspecified: Secondary | ICD-10-CM

## 2017-02-01 DIAGNOSIS — Z87891 Personal history of nicotine dependence: Secondary | ICD-10-CM | POA: Insufficient documentation

## 2017-02-01 DIAGNOSIS — I10 Essential (primary) hypertension: Secondary | ICD-10-CM | POA: Diagnosis not present

## 2017-02-01 DIAGNOSIS — E785 Hyperlipidemia, unspecified: Secondary | ICD-10-CM | POA: Insufficient documentation

## 2017-02-01 DIAGNOSIS — I999 Unspecified disorder of circulatory system: Secondary | ICD-10-CM | POA: Diagnosis not present

## 2017-02-01 DIAGNOSIS — F17210 Nicotine dependence, cigarettes, uncomplicated: Secondary | ICD-10-CM | POA: Diagnosis not present

## 2017-02-01 DIAGNOSIS — R9439 Abnormal result of other cardiovascular function study: Secondary | ICD-10-CM

## 2017-02-01 HISTORY — DX: Essential (primary) hypertension: I10

## 2017-02-01 HISTORY — DX: Personal history of nicotine dependence: Z87.891

## 2017-02-01 HISTORY — DX: Hyperlipidemia, unspecified: E78.5

## 2017-02-01 MED ORDER — NITROGLYCERIN 0.4 MG SL SUBL: 0.4000 mg | SUBLINGUAL_TABLET | SUBLINGUAL | 6 refills | Status: DC | PRN

## 2017-02-01 MED ORDER — ASPIRIN EC 81 MG PO TBEC: 162.0000 mg | DELAYED_RELEASE_TABLET | Freq: Every day | ORAL | 3 refills | Status: DC

## 2017-02-01 NOTE — Addendum Note (Signed)
Addended by: Mattie Marlin on: 02/01/2017 11:15 AM   Modules accepted: Orders

## 2017-02-01 NOTE — Progress Notes (Signed)
f °

## 2017-02-01 NOTE — Patient Instructions (Addendum)
Medication Instructions:  Your physician has recommended you make the following change in your medication: START Nitro as needed for chest pain and enteric coated aspirin 81mg  x 2 a day  Labwork: A chest x-ray takes a picture of the organs and structures inside the chest, including the heart, lungs, and blood vessels. This test can show several things, including, whether the heart is enlarges; whether fluid is building up in the lungs; and whether pacemaker / defibrillator leads are still in place.  Your physician recommends that you have: TSH, PT/INR, CBC, BMP, and a lipid panel   Testing/Procedures: Your physician has requested that you have a cardiac catheterization. Cardiac catheterization is used to diagnose and/or treat various heart conditions. Doctors may recommend this procedure for a number of different reasons. The most common reason is to evaluate chest pain. Chest pain can be a symptom of coronary artery disease (CAD), and cardiac catheterization can show whether plaque is narrowing or blocking your heart's arteries. This procedure is also used to evaluate the valves, as well as measure the blood flow and oxygen levels in different parts of your heart. For further information please visit [email or web address]. Please follow instruction sheet, as given.    Follow-Up: To be determined after heart catheterization  Any Other Special Instructions Will Be Listed Below (If Applicable).  Nitroglycerin sublingual tablets What is this medicine? NITROGLYCERIN (nye troe GLI ser in) is a type of vasodilator. It relaxes blood vessels, increasing the blood and oxygen supply to your heart. This medicine is used to relieve chest pain caused by angina. It is also used to prevent chest pain before activities like climbing stairs, going outdoors in cold weather, or sexual activity. This medicine may be used for other purposes; ask your health care provider or pharmacist if you have questions. COMMON  BRAND NAME(S): Nitroquick, Nitrostat, Nitrotab What should I tell my health care provider before I take this medicine? They need to know if you have any of these conditions: -anemia -head injury, recent stroke, or bleeding in the brain -liver disease -previous heart attack -an unusual or allergic reaction to nitroglycerin, other medicines, foods, dyes, or preservatives -pregnant or trying to get pregnant -breast-feeding How should I use this medicine? Take this medicine by mouth as needed. At the first sign of an angina attack (chest pain or tightness) place one tablet under your tongue. You can also take this medicine 5 to 10 minutes before an event likely to produce chest pain. Follow the directions on the prescription label. Let the tablet dissolve under the tongue. Do not swallow whole. Replace the dose if you accidentally swallow it. It will help if your mouth is not dry. Saliva around the tablet will help it to dissolve more quickly. Do not eat or drink, smoke or chew tobacco while a tablet is dissolving. If you are not better within 5 minutes after taking ONE dose of nitroglycerin, call 9-1-1 immediately to seek emergency medical care. Do not take more than 3 nitroglycerin tablets over 15 minutes. If you take this medicine often to relieve symptoms of angina, your doctor or health care professional may provide you with different instructions to manage your symptoms. If symptoms do not go away after following these instructions, it is important to call 9-1-1 immediately. Do not take more than 3 nitroglycerin tablets over 15 minutes. Talk to your pediatrician regarding the use of this medicine in children. Special care may be needed. Overdosage: If you think you have taken too much of this  medicine contact a poison control center or emergency room at once. NOTE: This medicine is only for you. Do not share this medicine with others. What if I miss a dose? This does not apply. This medicine is  only used as needed. What may interact with this medicine? Do not take this medicine with any of the following medications: -certain migraine medicines like ergotamine and dihydroergotamine (DHE) -medicines used to treat erectile dysfunction like sildenafil, tadalafil, and vardenafil -riociguat This medicine may also interact with the following medications: -alteplase -aspirin -heparin -medicines for high blood pressure -medicines for mental depression -other medicines used to treat angina -phenothiazines like chlorpromazine, mesoridazine, prochlorperazine, thioridazine This list may not describe all possible interactions. Give your health care provider a list of all the medicines, herbs, non-prescription drugs, or dietary supplements you use. Also tell them if you smoke, drink alcohol, or use illegal drugs. Some items may interact with your medicine. What should I watch for while using this medicine? Tell your doctor or health care professional if you feel your medicine is no longer working. Keep this medicine with you at all times. Sit or lie down when you take your medicine to prevent falling if you feel dizzy or faint after using it. Try to remain calm. This will help you to feel better faster. If you feel dizzy, take several deep breaths and lie down with your feet propped up, or bend forward with your head resting between your knees. You may get drowsy or dizzy. Do not drive, use machinery, or do anything that needs mental alertness until you know how this drug affects you. Do not stand or sit up quickly, especially if you are an older patient. This reduces the risk of dizzy or fainting spells. Alcohol can make you more drowsy and dizzy. Avoid alcoholic drinks. Do not treat yourself for coughs, colds, or pain while you are taking this medicine without asking your doctor or health care professional for advice. Some ingredients may increase your blood pressure. What side effects may I notice  from receiving this medicine? Side effects that you should report to your doctor or health care professional as soon as possible: -blurred vision -dry mouth -skin rash -sweating -the feeling of extreme pressure in the head -unusually weak or tired Side effects that usually do not require medical attention (report to your doctor or health care professional if they continue or are bothersome): -flushing of the face or neck -headache -irregular heartbeat, palpitations -nausea, vomiting This list may not describe all possible side effects. Call your doctor for medical advice about side effects. You may report side effects to FDA at 1-800-FDA-1088. Where should I keep my medicine? Keep out of the reach of children. Store at room temperature between 20 and 25 degrees C (68 and 77 degrees F). Store in [employer]. Protect from light and moisture. Keep tightly closed. Throw away any unused medicine after the expiration date. NOTE: This sheet is a summary. It may not cover all possible information. If you have questions about this medicine, talk to your doctor, pharmacist, or health care provider.  2018 Elsevier/Gold Standard (2013-02-13 17:57:36)  Coronary Angiogram With Stent Coronary angiogram with stent placement is a procedure to widen or open a narrow blood vessel of the heart (coronary artery). Arteries may become blocked by cholesterol buildup (plaques) in the lining or wall. When a coronary artery becomes partially blocked, blood flow to that area decreases. This may lead to chest pain or a heart attack (myocardial infarction).  A stent is a small piece of metal that looks like mesh or a spring. Stent placement may be done as treatment for a heart attack or right after a coronary angiogram in which a blocked artery is found. Let your health care provider know about:  Any allergies you have.  All medicines you are taking, including vitamins, herbs, eye drops, creams, and  over-the-counter medicines.  Any problems you or family members have had with anesthetic medicines.  Any blood disorders you have.  Any surgeries you have had.  Any medical conditions you have.  Whether you are pregnant or may be pregnant. What are the risks? Generally, this is a safe procedure. However, problems may occur, including:  Damage to the heart or its blood vessels.  A return of blockage.  Bleeding, infection, or bruising at the insertion site.  A collection of blood under the skin (hematoma) at the insertion site.  A blood clot in another part of the body.  Kidney injury.  Allergic reaction to the dye or contrast that is used.  Bleeding into the abdomen (retroperitoneal bleeding).  What happens before the procedure? Staying hydrated Follow instructions from your health care provider about hydration, which may include:  Up to 2 hours before the procedure - you may continue to drink clear liquids, such as water, clear fruit juice, black coffee, and plain tea.  Eating and drinking restrictions Follow instructions from your health care provider about eating and drinking, which may include:  8 hours before the procedure - stop eating heavy meals or foods such as meat, fried foods, or fatty foods.  6 hours before the procedure - stop eating light meals or foods, such as toast or cereal.  2 hours before the procedure - stop drinking clear liquids.  Ask your health care provider about:  Changing or stopping your regular medicines. This is especially important if you are taking diabetes medicines or blood thinners.  Taking medicines such as ibuprofen. These medicines can thin your blood. Do not take these medicines before your procedure if your health care provider instructs you not to. Generally, aspirin is recommended before a procedure of passing a small, thin tube (catheter) through a blood vessel and into the heart (cardiac catheterization).  What happens  during the procedure?  An IV tube will be inserted into one of your veins.  You will be given one or more of the following: ? A medicine to help you relax (sedative). ? A medicine to numb the area where the catheter will be inserted into an artery (local anesthetic).  To reduce your risk of infection: ? Your health care team will wash or sanitize their hands. ? Your skin will be washed with soap. ? Hair may be removed from the area where the catheter will be inserted.  Using a guide wire, the catheter will be inserted into an artery. The location may be in your groin, in your wrist, or in the fold of your arm (near your elbow).  A type of X-ray (fluoroscopy) will be used to help guide the catheter to the opening of the arteries in the heart.  A dye will be injected into the catheter, and X-rays will be taken. The dye will help to show where any narrowing or blockages are located in the arteries.  A tiny wire will be guided to the blocked spot, and a balloon will be inflated to make the artery wider.  The stent will be expanded and will crush the plaques into  the wall of the vessel. The stent will hold the area open and improve the blood flow. Most stents have a drug coating to reduce the risk of the stent narrowing over time.  The artery may be made wider using a drill, laser, or other tools to remove plaques.  When the blood flow is better, the catheter will be removed. The lining of the artery will grow over the stent, which stays where it was placed. This procedure may vary among health care providers and hospitals. What happens after the procedure?  If the procedure is done through the leg, you will be kept in bed lying flat for about 6 hours. You will be instructed to not bend and not cross your legs.  The insertion site will be checked frequently.  The pulse in your foot or wrist will be checked frequently.  You may have additional blood tests, X-rays, and a test that records  the electrical activity of your heart (electrocardiogram, or ECG). This information is not intended to replace advice given to you by your health care provider. Make sure you discuss any questions you have with your health care provider. Document Released: 10/22/2002 Document Revised: 12/16/2015 Document Reviewed: 11/21/2015 Elsevier Interactive Patient Education  2017 [employer].      If you need a refill on your cardiac medications before your next appointment, please call your pharmacy.

## 2017-02-01 NOTE — Progress Notes (Signed)
Cardiology Office Note:    Date:  02/01/2017   ID:  Deborah Stewart, DOB 08-Sep-1955, MRN [id number]  PCP:  Hoyt Koch, MD  Cardiologist:  Jenean Lindau, MD   Referring MD: Hoyt Koch, *    ASSESSMENT:    1. Angina pectoris ([place])   2. Essential hypertension   3. Dyslipidemia   4. Cigarette smoker    PLAN:    In order of problems listed above:  1. In view of the patient's symptoms, I discussed with the patient options for evaluation. Invasive and noninvasive options were given to the patient. I discussed stress testing with an imaging modality and coronary angiography and left heart catheterization at length. Benefits, pros and cons of each approach were discussed at length. Patient had multiple questions which were answered to the patient's satisfaction. Patient opted for invasive evaluation and we will set up for coronary angiography and left heart catheterization. Further recommendations will be made based on the findings with coronary angiography. In the interim if the patient has any significant symptoms in hospital to the nearest emergency room. 2. Sublingual nitroglycerin prescription was sent to the patient. I also advised to the patient to take to coated baby aspirin sooner regular basis. Sublingual nitroglycerin prescription was sent is protocol and 911 protocol explained and she verbalized understanding 3. Her blood pressure is stable 4. Lipids will be followed by primary care physician  I spent 5 minutes with Deborah Stewart discussing the risks of smoking. The patient vocalized understanding and plans to quit. I offered the patient, pharmacological interventions however the patient plans to initially try to quit by the patient's own efforts. Patient will get in touch with me if any pharmacological aid/prescriptions are felt necessary.  Medication Adjustments/Labs and Tests Ordered: Current medicines are reviewed at length with the patient  today.  Concerns regarding medicines are outlined above.  No orders of the defined types were placed in this encounter.  No orders of the defined types were placed in this encounter.    History of Present Illness:    Deborah Stewart is a 61 y.o. female who is being seen today for the evaluation of chest tightness and abnormal stress test at the request of Hoyt Koch, * Patient is a pleasant 61 year old female. She has past medical history of essential hypertension, dyslipidemia and active smoking for a very long time. She mentions to me that on exertion or mental stress, she has chest tightness radiating to the left arm. No orthopnea or PND. At the time of my evaluation she is alert awake oriented and in no distress. She does not exercise on a regular basis. She tells me that these findings were reproduced on the stress test.  Past Medical History:  Diagnosis Date  . Abnormal Pap smear   . ASCUS on Pap smear   . Breast mass   . DDD (degenerative disc disease), cervical    s/p diskectomy   . DDD (degenerative disc disease), lumbar    by MRI  . Diverticulosis of colon (without mention of hemorrhage)   . Dysmenorrhea   . Dyspareunia   . Dysplasia of cervix, low grade (CIN 1)   . Endometriosis   . History of chicken pox   . History of measles   . Hyperlipidemia   . Hypertension   . Pelvic pain in female   . Personal history of colonic polyps 06/2010   hyperplastic, tubular adenoma  . Thyroid nodule   .  Unspecified hypothyroidism     Past Surgical History:  Procedure Laterality Date  . CERVICAL DISC SURGERY  2004   Dr. Trenton Gammon  . fibroadenoma excision    . FOOT SURGERY     callus surgery both feet.  Dr. Ladora Daniel, DPO, removal of "mass" in June 2013  . THYROIDECTOMY, PARTIAL  Feb '11  . TOTAL ABDOMINAL HYSTERECTOMY W/ BILATERAL SALPINGOOPHORECTOMY    . TUBAL LIGATION      Current Medications: Current Meds  Medication Sig  . busPIRone (BUSPAR) 5 MG  tablet Take 1 tablet (5 mg total) by mouth 2 (two) times daily as needed.  . cyclobenzaprine (FLEXERIL) 10 MG tablet Take 1 tablet (10 mg total) by mouth every 8 (eight) hours as needed.  . hydrochlorothiazide (MICROZIDE) 12.5 MG capsule Take 1 capsule (12.5 mg total) by mouth daily.  . hyoscyamine (LEVSIN SL) 0.125 MG SL tablet Take 1 tablet (0.125 mg total) by mouth every 6 (six) hours as needed for cramping.  Marland Kitchen levothyroxine (SYNTHROID, LEVOTHROID) 100 MCG tablet Take 1 tablet (100 mcg total) by mouth daily before breakfast. Need office visit with labs for further refills.  . mesalamine (LIALDA) 1.2 g EC tablet TAKE 2 TABLETS (2.4 G TOTAL) BY MOUTH DAILY WITH BREAKFAST.  Marland Kitchen Varenicline Tartrate (CHANTIX PO) Take 1 tablet by mouth 2 (two) times daily.     Allergies:   Codeine   Social History   Social History  . Marital status: Divorced    Spouse name: N/A  . Number of children: 1  . Years of education: 38   Occupational History  . filed Archivist   Social History Main Topics  . Smoking status: Light Tobacco Smoker    Packs/day: 0.00    Years: 35.00    Last attempt to quit: 10/19/2013  . Smokeless tobacco: Never Used     Comment: 3 cigs a week---taking Chantix  . Alcohol use 3.0 oz/week    5 Standard drinks or equivalent per week     Comment: Occasional  . Drug use: No  . Sexual activity: Not Asked   Other Topics Concern  . None   Social History Narrative   [employer], [employer] -BS social work. Married 1980 - 84 yrs/divorced. 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 Dating - long term relationship/[place]. Unprotected. No history of physical or sexual abuse.      Family History: The patient's family history includes Breast cancer in her sister and unknown relative; Diabetes in her brother, father, mother, and unknown relative; Heart disease in her brother, father, and unknown relative; Hyperlipidemia in her brother; Hypertension in her  brother and brother; Lung cancer in her unknown relative. There is no history of Colon cancer.  ROS:   Please see the history of present illness.    All other systems reviewed and are negative.  EKGs/Labs/Other Studies Reviewed:    The following studies were reviewed today: I reviewed patient's records and discussed the findings extensively with the patient. She verbalized understanding. EKG and stress test reports were also discussed.   Recent Labs: No results found for requested labs within last 8760 hours.  Recent Lipid Panel    Component Value Date/Time   CHOL 194 09/16/2015 1438   TRIG 144.0 09/16/2015 1438   HDL 33.10 (L) 09/16/2015 1438   CHOLHDL 6 09/16/2015 1438   VLDL 28.8 09/16/2015 1438   LDLCALC 132 (H) 09/16/2015 1438   LDLDIRECT 112.1 10/11/2012 1554  Physical Exam:    VS:  BP 126/82   Pulse 73   Ht 5' 4.5" (1.638 m)   Wt 176 lb (79.8 kg)   SpO2 96%   BMI 29.74 kg/m     Wt Readings from Last 3 Encounters:  02/01/17 176 lb (79.8 kg)  01/16/17 174 lb (78.9 kg)  12/01/15 171 lb (77.6 kg)     GEN: Patient is in no acute distress HEENT: Normal NECK: No JVD; No carotid bruits LYMPHATICS: No lymphadenopathy CARDIAC: S1 S2 regular, 2/6 systolic murmur at the apex. RESPIRATORY:  Clear to auscultation without rales, wheezing or rhonchi  ABDOMEN: Soft, non-tender, non-distended MUSCULOSKELETAL:  No edema; No deformity  SKIN: Warm and dry NEUROLOGIC:  Alert and oriented x 3 PSYCHIATRIC:  Normal affect    Signed, Jenean Lindau, MD  02/01/2017 10:50 AM    [place]

## 2017-02-02 LAB — CBC WITH DIFFERENTIAL
Basophils Absolute: 0 10*3/uL (ref 0.0–0.2)
Basos: 0 %
EOS (ABSOLUTE): 0.1 10*3/uL (ref 0.0–0.4)
Eos: 1 %
Hematocrit: 44.4 % (ref 34.0–46.6)
Hemoglobin: 15.4 g/dL (ref 11.1–15.9)
Immature Grans (Abs): 0 10*3/uL (ref 0.0–0.1)
Immature Granulocytes: 0 %
Lymphocytes Absolute: 2.7 10*3/uL (ref 0.7–3.1)
Lymphs: 32 %
MCH: 30.4 pg (ref 26.6–33.0)
MCHC: 34.7 g/dL (ref 31.5–35.7)
MCV: 88 fL (ref 79–97)
Monocytes Absolute: 0.3 10*3/uL (ref 0.1–0.9)
Monocytes: 4 %
Neutrophils Absolute: 5.4 10*3/uL (ref 1.4–7.0)
Neutrophils: 63 %
RBC: 5.06 x10E6/uL (ref 3.77–5.28)
RDW: 14.3 % (ref 12.3–15.4)
WBC: 8.6 10*3/uL (ref 3.4–10.8)

## 2017-02-02 LAB — BASIC METABOLIC PANEL
BUN/Creatinine Ratio: 17 (ref 12–28)
BUN: 12 mg/dL (ref 8–27)
CO2: 25 mmol/L (ref 20–29)
Calcium: 9.5 mg/dL (ref 8.7–10.3)
Chloride: 102 mmol/L (ref 96–106)
Creatinine, Ser: 0.72 mg/dL (ref 0.57–1.00)
GFR calc Af Amer: 105 mL/min/{1.73_m2} (ref >59)
GFR calc non Af Amer: 91 mL/min/{1.73_m2} (ref >59)
Glucose: 88 mg/dL (ref 65–99)
Potassium: 4.4 mmol/L (ref 3.5–5.2)
Sodium: 138 mmol/L (ref 134–144)

## 2017-02-02 LAB — TSH: TSH: 2.08 u[IU]/mL (ref 0.450–4.500)

## 2017-02-02 LAB — LIPID PANEL
Chol/HDL Ratio: 4.4 ratio (ref 0.0–4.4)
Cholesterol, Total: 197 mg/dL (ref 100–199)
HDL: 45 mg/dL (ref >39)
LDL Calculated: 137 mg/dL — ABNORMAL HIGH (ref 0–99)
Triglycerides: 76 mg/dL (ref 0–149)
VLDL Cholesterol Cal: 15 mg/dL (ref 5–40)

## 2017-02-02 LAB — PROTIME-INR
INR: 1.1 (ref 0.8–1.2)
Prothrombin Time: 10.9 s (ref 9.1–12.0)

## 2017-02-05 ENCOUNTER — Telehealth: Payer: Self-pay

## 2017-02-05 NOTE — Telephone Encounter (Signed)
Patient contacted pre-catheterization at [facility] scheduled for:  02/06/2017 @ 1030 Verified arrival time and place:  NT @ 0800 Confirmed AM meds to be taken pre-cath with sip of water: Take ASA Hold HCTZ Confirmed patient has responsible person to drive home post procedure and observe patient for 24 hours:  Notified Pt that she must have someone to be with her for 24 hours after procedure, notified Pt of wrist precautions and restrictions d/t Pt caregiver for father with dementia.  Pt indicates understanding. Addl concerns:  Covered cath teaching with Pt.

## 2017-02-06 ENCOUNTER — Encounter (HOSPITAL_COMMUNITY): Payer: Self-pay | Admitting: *Deleted

## 2017-02-06 ENCOUNTER — Ambulatory Visit (HOSPITAL_COMMUNITY)
Admission: RE | Admit: 2017-02-06 | Discharge: 2017-02-07 | Disposition: A | Discharge status: Home / Self Care | Payer: BLUE CROSS/BLUE SHIELD | Source: Ambulatory Visit | Attending: Interventional Cardiology | Admitting: Interventional Cardiology

## 2017-02-06 ENCOUNTER — Ambulatory Visit (HOSPITAL_COMMUNITY)
Admission: RE | Disposition: A | Discharge status: Home / Self Care | Payer: Self-pay | Source: Ambulatory Visit | Attending: Interventional Cardiology

## 2017-02-06 DIAGNOSIS — I209 Angina pectoris, unspecified: Secondary | ICD-10-CM

## 2017-02-06 DIAGNOSIS — F17210 Nicotine dependence, cigarettes, uncomplicated: Secondary | ICD-10-CM | POA: Diagnosis not present

## 2017-02-06 DIAGNOSIS — E785 Hyperlipidemia, unspecified: Secondary | ICD-10-CM | POA: Diagnosis present

## 2017-02-06 DIAGNOSIS — Z87891 Personal history of nicotine dependence: Secondary | ICD-10-CM | POA: Diagnosis present

## 2017-02-06 DIAGNOSIS — Z941 Heart transplant status: Secondary | ICD-10-CM | POA: Insufficient documentation

## 2017-02-06 DIAGNOSIS — I25119 Atherosclerotic heart disease of native coronary artery with unspecified angina pectoris: Secondary | ICD-10-CM | POA: Diagnosis not present

## 2017-02-06 DIAGNOSIS — I25118 Atherosclerotic heart disease of native coronary artery with other forms of angina pectoris: Secondary | ICD-10-CM

## 2017-02-06 DIAGNOSIS — I2510 Atherosclerotic heart disease of native coronary artery without angina pectoris: Secondary | ICD-10-CM

## 2017-02-06 DIAGNOSIS — Z888 Allergy status to other drugs, medicaments and biological substances status: Secondary | ICD-10-CM | POA: Insufficient documentation

## 2017-02-06 DIAGNOSIS — R9439 Abnormal result of other cardiovascular function study: Secondary | ICD-10-CM | POA: Diagnosis present

## 2017-02-06 DIAGNOSIS — Z720 Tobacco use: Secondary | ICD-10-CM

## 2017-02-06 DIAGNOSIS — I10 Essential (primary) hypertension: Secondary | ICD-10-CM | POA: Insufficient documentation

## 2017-02-06 DIAGNOSIS — I25759 Atherosclerosis of native coronary artery of transplanted heart with unspecified angina pectoris: Secondary | ICD-10-CM | POA: Insufficient documentation

## 2017-02-06 HISTORY — PX: CORONARY STENT INTERVENTION: CATH118234

## 2017-02-06 HISTORY — DX: Anxiety disorder, unspecified: F41.9

## 2017-02-06 HISTORY — PX: LEFT HEART CATH AND CORONARY ANGIOGRAPHY: CATH118249

## 2017-02-06 HISTORY — DX: Atherosclerotic heart disease of native coronary artery without angina pectoris: I25.10

## 2017-02-06 HISTORY — DX: Ulcerative colitis, unspecified, without complications: K51.90

## 2017-02-06 HISTORY — PX: CORONARY ANGIOPLASTY WITH STENT PLACEMENT: SHX49

## 2017-02-06 HISTORY — DX: Unspecified osteoarthritis, unspecified site: M19.90

## 2017-02-06 LAB — POCT ACTIVATED CLOTTING TIME
Activated Clotting Time: 313 seconds
Activated Clotting Time: 367 seconds

## 2017-02-06 LAB — PLATELET COUNT: Platelets: 286 10*3/uL (ref 150–400)

## 2017-02-06 SURGERY — LEFT HEART CATH AND CORONARY ANGIOGRAPHY
Anesthesia: LOCAL

## 2017-02-06 MED ORDER — SODIUM CHLORIDE 0.9% FLUSH: 3.0000 mL | INTRAVENOUS | Status: DC | PRN

## 2017-02-06 MED ORDER — SODIUM CHLORIDE 0.9% FLUSH
3.0000 mL | Freq: Two times a day (BID) | INTRAVENOUS | Status: DC
  Administered 2017-02-06: 3 mL via INTRAVENOUS

## 2017-02-06 MED ORDER — LIDOCAINE HCL (PF) 1 % IJ SOLN
INTRAMUSCULAR | Status: DC | PRN
  Administered 2017-02-06: 2 mL

## 2017-02-06 MED ORDER — VARENICLINE TARTRATE 1 MG PO TABS
1.0000 mg | ORAL_TABLET | Freq: Two times a day (BID) | ORAL | Status: DC
  Administered 2017-02-06 – 2017-02-07 (×2): 1 mg via ORAL
  Filled 2017-02-06 (×2): qty 1

## 2017-02-06 MED ORDER — HEPARIN (PORCINE) IN NACL 2-0.9 UNIT/ML-% IJ SOLN
INTRAMUSCULAR | Status: AC | PRN
  Administered 2017-02-06: 1000 mL

## 2017-02-06 MED ORDER — METOPROLOL SUCCINATE ER 25 MG PO TB24
25.0000 mg | ORAL_TABLET | Freq: Every day | ORAL | Status: DC
  Administered 2017-02-06 – 2017-02-07 (×2): 25 mg via ORAL
  Filled 2017-02-06 (×2): qty 1

## 2017-02-06 MED ORDER — SODIUM CHLORIDE 0.9% FLUSH: 3.0000 mL | Freq: Two times a day (BID) | INTRAVENOUS | Status: DC

## 2017-02-06 MED ORDER — VERAPAMIL HCL 2.5 MG/ML IV SOLN
INTRAVENOUS | Status: AC
  Filled 2017-02-06: qty 2

## 2017-02-06 MED ORDER — HEPARIN SODIUM (PORCINE) 1000 UNIT/ML IJ SOLN
INTRAMUSCULAR | Status: AC
  Filled 2017-02-06: qty 1

## 2017-02-06 MED ORDER — SODIUM CHLORIDE 0.9 % WEIGHT BASED INFUSION: 1.0000 mL/kg/h | INTRAVENOUS | Status: DC

## 2017-02-06 MED ORDER — LIDOCAINE HCL 2 % IJ SOLN
INTRAMUSCULAR | Status: AC
  Filled 2017-02-06: qty 10

## 2017-02-06 MED ORDER — ASPIRIN 81 MG PO CHEW: 81.0000 mg | CHEWABLE_TABLET | ORAL | Status: DC

## 2017-02-06 MED ORDER — IOPAMIDOL (ISOVUE-370) INJECTION 76%
INTRAVENOUS | Status: AC
  Filled 2017-02-06: qty 100

## 2017-02-06 MED ORDER — MIDAZOLAM HCL 2 MG/2ML IJ SOLN
INTRAMUSCULAR | Status: AC
  Filled 2017-02-06: qty 2

## 2017-02-06 MED ORDER — SODIUM CHLORIDE 0.9 % IV SOLN: 250.0000 mL | INTRAVENOUS | Status: DC | PRN

## 2017-02-06 MED ORDER — ASPIRIN 81 MG PO CHEW
81.0000 mg | CHEWABLE_TABLET | Freq: Every day | ORAL | Status: DC
  Administered 2017-02-07: 81 mg via ORAL
  Filled 2017-02-06: qty 1

## 2017-02-06 MED ORDER — LABETALOL HCL 5 MG/ML IV SOLN: 10.0000 mg | INTRAVENOUS | Status: AC | PRN

## 2017-02-06 MED ORDER — ATORVASTATIN CALCIUM 80 MG PO TABS
80.0000 mg | ORAL_TABLET | Freq: Every day | ORAL | Status: DC
  Administered 2017-02-06: 80 mg via ORAL
  Filled 2017-02-06: qty 1

## 2017-02-06 MED ORDER — VERAPAMIL HCL 2.5 MG/ML IV SOLN
INTRAVENOUS | Status: DC | PRN
  Administered 2017-02-06: 10 mL via INTRA_ARTERIAL
  Administered 2017-02-06: 13:00:00 via INTRA_ARTERIAL
  Administered 2017-02-06: 10 mL via INTRA_ARTERIAL

## 2017-02-06 MED ORDER — FAMOTIDINE IN NACL 20-0.9 MG/50ML-% IV SOLN
INTRAVENOUS | Status: AC | PRN
  Administered 2017-02-06: 20 mg via INTRAVENOUS

## 2017-02-06 MED ORDER — HEPARIN (PORCINE) IN NACL 2-0.9 UNIT/ML-% IJ SOLN
INTRAMUSCULAR | Status: AC
  Filled 2017-02-06: qty 1000

## 2017-02-06 MED ORDER — ANGIOPLASTY BOOK
Freq: Once | Status: AC
  Administered 2017-02-06: 20:00:00
  Filled 2017-02-06: qty 1

## 2017-02-06 MED ORDER — NITROGLYCERIN 0.4 MG SL SUBL: 0.4000 mg | SUBLINGUAL_TABLET | SUBLINGUAL | Status: DC | PRN

## 2017-02-06 MED ORDER — CLOPIDOGREL BISULFATE 75 MG PO TABS
75.0000 mg | ORAL_TABLET | Freq: Every day | ORAL | Status: DC
  Administered 2017-02-07: 75 mg via ORAL
  Filled 2017-02-06: qty 1

## 2017-02-06 MED ORDER — MIDAZOLAM HCL 2 MG/2ML IJ SOLN
INTRAMUSCULAR | Status: DC | PRN
  Administered 2017-02-06 (×3): 1 mg via INTRAVENOUS

## 2017-02-06 MED ORDER — IOPAMIDOL (ISOVUE-370) INJECTION 76%
INTRAVENOUS | Status: DC | PRN
  Administered 2017-02-06: 150 mL via INTRA_ARTERIAL

## 2017-02-06 MED ORDER — HEPARIN SODIUM (PORCINE) 1000 UNIT/ML IJ SOLN
INTRAMUSCULAR | Status: DC | PRN
  Administered 2017-02-06: 4000 [IU] via INTRAVENOUS
  Administered 2017-02-06: 6000 [IU] via INTRAVENOUS

## 2017-02-06 MED ORDER — SODIUM CHLORIDE 0.9 % IV SOLN
INTRAVENOUS | Status: AC
  Administered 2017-02-06: 14:00:00 via INTRAVENOUS

## 2017-02-06 MED ORDER — NITROGLYCERIN 1 MG/10 ML FOR IR/CATH LAB
INTRA_ARTERIAL | Status: DC | PRN
  Administered 2017-02-06: 200 ug via INTRACORONARY

## 2017-02-06 MED ORDER — ONDANSETRON HCL 4 MG/2ML IJ SOLN: 4.0000 mg | Freq: Four times a day (QID) | INTRAMUSCULAR | Status: DC | PRN

## 2017-02-06 MED ORDER — BUSPIRONE HCL 5 MG PO TABS
5.0000 mg | ORAL_TABLET | Freq: Two times a day (BID) | ORAL | Status: DC
  Administered 2017-02-06 – 2017-02-07 (×2): 5 mg via ORAL
  Filled 2017-02-06 (×2): qty 1

## 2017-02-06 MED ORDER — HEPARIN SODIUM (PORCINE) 5000 UNIT/ML IJ SOLN: 5000.0000 [IU] | Freq: Three times a day (TID) | INTRAMUSCULAR | Status: DC

## 2017-02-06 MED ORDER — CLOPIDOGREL BISULFATE 300 MG PO TABS
ORAL_TABLET | ORAL | Status: DC | PRN
  Administered 2017-02-06: 600 mg via ORAL

## 2017-02-06 MED ORDER — ACETAMINOPHEN 325 MG PO TABS: 650.0000 mg | ORAL_TABLET | ORAL | Status: DC | PRN

## 2017-02-06 MED ORDER — FENTANYL CITRATE (PF) 100 MCG/2ML IJ SOLN
INTRAMUSCULAR | Status: AC
  Filled 2017-02-06: qty 2

## 2017-02-06 MED ORDER — HYDRALAZINE HCL 20 MG/ML IJ SOLN: 5.0000 mg | INTRAMUSCULAR | Status: AC | PRN

## 2017-02-06 MED ORDER — ALUM & MAG HYDROXIDE-SIMETH 200-200-20 MG/5ML PO SUSP: 30.0000 mL | ORAL | Status: DC | PRN

## 2017-02-06 MED ORDER — ALUM & MAG HYDROXIDE-SIMETH 200-200-20 MG/5ML PO SUSP
30.0000 mL | ORAL | Status: DC | PRN
  Administered 2017-02-06: 30 mL via ORAL
  Filled 2017-02-06: qty 30

## 2017-02-06 MED ORDER — FENTANYL CITRATE (PF) 100 MCG/2ML IJ SOLN
INTRAMUSCULAR | Status: DC | PRN
  Administered 2017-02-06 (×2): 50 ug via INTRAVENOUS

## 2017-02-06 MED ORDER — FAMOTIDINE IN NACL 20-0.9 MG/50ML-% IV SOLN
INTRAVENOUS | Status: AC
  Filled 2017-02-06: qty 50

## 2017-02-06 MED ORDER — SODIUM CHLORIDE 0.9 % WEIGHT BASED INFUSION
3.0000 mL/kg/h | INTRAVENOUS | Status: DC
  Administered 2017-02-06: 3 mL/kg/h via INTRAVENOUS

## 2017-02-06 MED ORDER — NITROGLYCERIN 1 MG/10 ML FOR IR/CATH LAB
INTRA_ARTERIAL | Status: AC
  Filled 2017-02-06: qty 10

## 2017-02-06 MED ORDER — LEVOTHYROXINE SODIUM 100 MCG PO TABS
100.0000 ug | ORAL_TABLET | Freq: Every day | ORAL | Status: DC
  Administered 2017-02-07: 100 ug via ORAL
  Filled 2017-02-06: qty 1

## 2017-02-06 SURGICAL SUPPLY — 22 items
BALLN EMERGE MR 2.5X15 (BALLOONS) ×2
BALLN ~~LOC~~ EMERGE MR 3.5X20 (BALLOONS) ×2
BALLN ~~LOC~~ EMERGE MR 4.0X20 (BALLOONS) ×2
BALLOON EMERGE MR 2.5X15 (BALLOONS) ×1 IMPLANT
BALLOON ~~LOC~~ EMERGE MR 3.5X20 (BALLOONS) ×1 IMPLANT
BALLOON ~~LOC~~ EMERGE MR 4.0X20 (BALLOONS) ×1 IMPLANT
CATH EXPO 5F FL3.5 (CATHETERS) ×2 IMPLANT
CATH INFINITI JR4 5F (CATHETERS) ×2 IMPLANT
CATH VISTA GUIDE 6FR JR4 (CATHETERS) ×2 IMPLANT
COVER PRB 48X5XTLSCP FOLD TPE (BAG) ×1 IMPLANT
COVER PROBE 5X48 (BAG) ×1
DEVICE RAD COMP TR BAND LRG (VASCULAR PRODUCTS) ×2 IMPLANT
GLIDESHEATH SLEND A-KIT 6F 22G (SHEATH) ×2 IMPLANT
GUIDEWIRE INQWIRE 1.5J.035X260 (WIRE) ×1 IMPLANT
INQWIRE 1.5J .035X260CM (WIRE) ×2
KIT ENCORE 26 ADVANTAGE (KITS) ×2 IMPLANT
KIT HEART LEFT (KITS) ×2 IMPLANT
PACK CARDIAC CATHETERIZATION (CUSTOM PROCEDURE TRAY) ×2 IMPLANT
STENT SIERRA 3.00 X 23 MM (Permanent Stent) ×2 IMPLANT
TRANSDUCER W/STOPCOCK (MISCELLANEOUS) ×2 IMPLANT
TUBING CIL FLEX 10 FLL-RA (TUBING) ×2 IMPLANT
WIRE ASAHI PROWATER 180CM (WIRE) ×2 IMPLANT

## 2017-02-06 NOTE — Interval H&P Note (Signed)
Cath Lab Visit (complete for each Cath Lab visit)  Clinical Evaluation Leading to the Procedure:   ACS: No.  Non-ACS:    Anginal Classification: CCS III  Anti-ischemic medical therapy: Maximal Therapy (2 or more classes of medications)  Non-Invasive Test Results: High-risk stress test findings: cardiac mortality >3%/year  Prior CABG: No previous CABG      History and Physical Interval Note:  02/06/2017 12:29 PM  Deborah Stewart  has presented today for surgery, with the diagnosis of angina  The various methods of treatment have been discussed with the patient and family. After consideration of risks, benefits and other options for treatment, the patient has consented to  Procedure(s): LEFT HEART CATH AND CORONARY ANGIOGRAPHY (N/A) as a surgical intervention .  The patient's history has been reviewed, patient examined, no change in status, stable for surgery.  I have reviewed the patient's chart and labs.  Questions were answered to the patient's satisfaction.     Belva Crome III

## 2017-02-06 NOTE — H&P (View-Only) (Signed)
f °

## 2017-02-07 ENCOUNTER — Telehealth: Payer: Self-pay | Admitting: Cardiology

## 2017-02-07 ENCOUNTER — Encounter (HOSPITAL_COMMUNITY): Payer: Self-pay | Admitting: Cardiology

## 2017-02-07 DIAGNOSIS — Z87891 Personal history of nicotine dependence: Secondary | ICD-10-CM | POA: Diagnosis not present

## 2017-02-07 DIAGNOSIS — R9439 Abnormal result of other cardiovascular function study: Secondary | ICD-10-CM | POA: Diagnosis not present

## 2017-02-07 DIAGNOSIS — Z888 Allergy status to other drugs, medicaments and biological substances status: Secondary | ICD-10-CM | POA: Diagnosis not present

## 2017-02-07 DIAGNOSIS — F17210 Nicotine dependence, cigarettes, uncomplicated: Secondary | ICD-10-CM | POA: Diagnosis not present

## 2017-02-07 DIAGNOSIS — I25119 Atherosclerotic heart disease of native coronary artery with unspecified angina pectoris: Secondary | ICD-10-CM | POA: Diagnosis not present

## 2017-02-07 DIAGNOSIS — E785 Hyperlipidemia, unspecified: Secondary | ICD-10-CM

## 2017-02-07 DIAGNOSIS — I209 Angina pectoris, unspecified: Secondary | ICD-10-CM

## 2017-02-07 DIAGNOSIS — Z955 Presence of coronary angioplasty implant and graft: Secondary | ICD-10-CM

## 2017-02-07 LAB — BASIC METABOLIC PANEL
Anion gap: 12 (ref 5–15)
BUN: 10 mg/dL (ref 6–20)
CO2: 25 mmol/L (ref 22–32)
Calcium: 8.7 mg/dL — ABNORMAL LOW (ref 8.9–10.3)
Chloride: 102 mmol/L (ref 101–111)
Creatinine, Ser: 0.8 mg/dL (ref 0.44–1.00)
GFR calc Af Amer: >60 mL/min (ref >60)
GFR calc non Af Amer: >60 mL/min (ref >60)
Glucose, Bld: 106 mg/dL — ABNORMAL HIGH (ref 65–99)
Potassium: 4.5 mmol/L (ref 3.5–5.1)
Sodium: 139 mmol/L (ref 135–145)

## 2017-02-07 LAB — TROPONIN I: Troponin I: 0.1 ng/mL (ref <0.03)

## 2017-02-07 MED ORDER — ATORVASTATIN CALCIUM 80 MG PO TABS: 80.0000 mg | ORAL_TABLET | Freq: Every day | ORAL | 0 refills | Status: DC

## 2017-02-07 MED ORDER — PANTOPRAZOLE SODIUM 40 MG PO TBEC: 40.0000 mg | DELAYED_RELEASE_TABLET | Freq: Every day | ORAL | 1 refills | Status: DC

## 2017-02-07 MED ORDER — ASPIRIN EC 81 MG PO TBEC
81.0000 mg | DELAYED_RELEASE_TABLET | Freq: Every day | ORAL | 3 refills | Status: DC
Start: 2017-02-07 — End: 2018-05-17

## 2017-02-07 MED ORDER — CLOPIDOGREL BISULFATE 75 MG PO TABS: 75.0000 mg | ORAL_TABLET | Freq: Every day | ORAL | 2 refills | Status: DC

## 2017-02-07 MED ORDER — METOPROLOL SUCCINATE ER 25 MG PO TB24: 25.0000 mg | ORAL_TABLET | Freq: Every day | ORAL | 0 refills | Status: DC

## 2017-02-07 MED ORDER — NITROGLYCERIN 0.4 MG SL SUBL: 0.4000 mg | SUBLINGUAL_TABLET | SUBLINGUAL | 2 refills | Status: DC | PRN

## 2017-02-07 MED ORDER — GI COCKTAIL ~~LOC~~
30.0000 mL | Freq: Once | ORAL | Status: AC
  Administered 2017-02-07: 30 mL via ORAL
  Filled 2017-02-07: qty 30

## 2017-02-07 MED FILL — Lidocaine HCl Local Inj 2%: INTRAMUSCULAR | Qty: 10 | Status: AC

## 2017-02-07 NOTE — Progress Notes (Signed)
CARDIAC REHAB PHASE I   PRE:  Rate/Rhythm: 71 SR    BP: sitting 130/70    SaO2:   MODE:  Ambulation: 700 ft   POST:  Rate/Rhythm: 86 SR    BP: sitting 144/75     SaO2:   Tolerated well, no c/o. Ed completed with good reception. She is ready for change. Plans to quit smoking and she was excited about fake cigarette. Understands the importance of Plavix/ASA. Will refer to [place] although will need an order from Dr. Geraldo Pitter. [place], ACSM 02/07/2017 9:27 AM

## 2017-02-07 NOTE — Telephone Encounter (Signed)
Left patient a voicemail and requested that she call the office.

## 2017-02-07 NOTE — Care Management Note (Signed)
Case Management Note  Patient Details  Name: Deborah Stewart MRN: [id number] Date of Birth: [birth year]  Subjective/Objective:  From home alone, s/p coronary stent intervention, will be on plavix and asa. For dc today, no needs.                  Action/Plan:   Expected Discharge Date:  02/07/17               Expected Discharge Plan:  Home/Self Care  In-House Referral:     Discharge planning Services  CM Consult  Post Acute Care Choice:    Choice offered to:     DME Arranged:    DME Agency:     HH Arranged:    HH Agency:     Status of Service:  Completed, signed off  If discussed at [employer] of Stay Meetings, dates discussed:    Additional Comments:  Zenon Mayo, RN 02/07/2017, 8:58 AM

## 2017-02-07 NOTE — Progress Notes (Signed)
Spoke with patient and informed her of the results.

## 2017-02-07 NOTE — Telephone Encounter (Signed)
Patient contacted regarding discharge from [facility] on 02/07/2017.  Patient understands to follow up with provider Revankar on 02/15/2017 at 10:20 am at [place]. Patient understands discharge instructions? Yes Patient understands medications and regiment? Yes Patient understands to bring all medications to this visit? Yes  Stressed the importance of the medication Plavix and how it works. Also discussed the discontinuation of smoking with the patient.

## 2017-02-07 NOTE — Discharge Summary (Signed)
Discharge Summary    Patient ID: Deborah Stewart,  MRN: [id number], DOB/AGE: March 20, 1956 61 y.o.  Admit date: 02/06/2017 Discharge date: 02/07/2017  Primary Care Provider: Pricilla Holm A Primary Cardiologist: Revankar  Discharge Diagnoses    Principal Problem:   Angina pectoris [facility]) Active Problems:   History of tobacco abuse   Dyslipidemia   Cigarette smoker   Abnormal stress electrocardiogram test using treadmill   CAD (coronary artery disease), native coronary artery   Allergies Allergies  Allergen Reactions  . Codeine     REACTION: nausea and hives    Diagnostic Studies/Procedures    Cath: 02/06/17  Conclusion    Class III angina pectoris occurring with minimal physical activity and early positive excise treadmill test.  Segmental 95+ percent stenosis in the mid right coronary. Proximal RCA contains 30% narrowing.  Luminal irregularities noted in the circumflex coronary artery, first diagonal, and 50% narrowing in the mid LAD.  Successful PCI with stent implantation reducing 95% stenosis to 0% with TIMI grade 3 flow using a Xience Sierra 3.0 x 23 mm postdilated to 4.0. TIMI grade 3 flow is noted.  RECOMMENDATIONS:   Aspirin and Plavix 6 months and optimally 12 months if possible.  Low-dose beta blocker therapy.  High intensity statin therapy.  Smoking cessation.   Phase II cardiac rehabilitation if possible.  _____________   History of Present Illness     61 yo female with PMH of HTN, HL, hypothyroidism and tobacco use who presented to the office on 02/01/17 with chest tightness and recent abnormal stress test with Dr. Geraldo Pitter. Given her reported symptoms and the option of cath was discussed with the patient and she was willing to proceed.   Hospital Course     She underwent outpatient cath noted above with successful PCI/DESx1 placed to the [facility]. Other mild nonobstructive disease noted. LV was normal on LV gram. Plan for DAPT with  ASA/plavix. She was also started on low dose BB therapy, and high dose statin. Smoking cessation was strongly encouraged. Seen by cardiac rehab and worked well without any residual chest pain. Post cath labs were stable.   General: Well developed, well nourished, female appearing in no acute distress. Head: Normocephalic, atraumatic.  Neck: Supple without bruits, JVD. Lungs:  Resp regular and unlabored, CTA. Heart: RRR, S1, S2, no S3, S4, or murmur; no rub. Abdomen: Soft, non-tender, non-distended with normoactive bowel sounds. No hepatomegaly. No rebound/guarding. No obvious abdominal masses. Extremities: No clubbing, cyanosis, edema. Distal pedal pulses are 2+ bilaterally. R radial cath site stable without bruising or hematoma Neuro: Alert and oriented X 3. Moves all extremities spontaneously. Psych: Normal affect.  Deborah Stewart was seen by Dr. Gwenlyn Found and determined stable for discharge home. Follow up in the office has been arranged. Medications are listed below.   _____________  Discharge Vitals Blood pressure (!) 144/75, pulse 69, temperature 97.8 F (36.6 C), temperature source Oral, resp. rate 18, height 5\' 5"  (1.651 m), weight 176 lb 9.6 oz (80.1 kg), SpO2 98 %.  Filed Weights   02/06/17 0755 02/07/17 0116  Weight: 176 lb (79.8 kg) 176 lb 9.6 oz (80.1 kg)    Labs & Radiologic Studies    CBC  Recent Labs  02/06/17 0929  PLT 166   Basic Metabolic Panel  Recent Labs  02/07/17 0300  NA 139  K 4.5  CL 102  CO2 25  GLUCOSE 106*  BUN 10  CREATININE 0.80  CALCIUM 8.7*   Liver  Function Tests No results for input(s): AST, ALT, ALKPHOS, BILITOT, PROT, ALBUMIN in the last 72 hours. No results for input(s): LIPASE, AMYLASE in the last 72 hours. Cardiac Enzymes  Recent Labs  02/07/17 0300  TROPONINI 0.10*   BNP Invalid input(s): POCBNP D-Dimer No results for input(s): DDIMER in the last 72 hours. Hemoglobin A1C No results for input(s): HGBA1C in the last  72 hours. Fasting Lipid Panel No results for input(s): CHOL, HDL, LDLCALC, TRIG, CHOLHDL, LDLDIRECT in the last 72 hours. Thyroid Function Tests No results for input(s): TSH, T4TOTAL, T3FREE, THYROIDAB in the last 72 hours.  Invalid input(s): FREET3 _____________  Dg Chest 2 View  Result Date: 02/01/2017 CLINICAL DATA:  Angina pectoris for several days. Hypertension and hyperlipidemia. EXAM: CHEST  2 VIEW COMPARISON:  Chest radiograph 07/22/2003. FINDINGS: The heart size and mediastinal contours are within normal limits. Both lungs are free of infiltrates or consolidation. There is mild vascular congestion. The visualized skeletal structures are unremarkable. Remote anterior cervical fusion plate. IMPRESSION: Mild vascular congestion. No active infiltrates or failure. Normal cardiomediastinal silhouette. Electronically Signed   By: Staci Righter M.D.   On: 02/01/2017 15:35   Disposition   Pt is being discharged home today in good condition.  Follow-up Plans & Appointments    Follow-up Information    Revankar, Reita Cliche, MD Follow up on 02/15/2017.   Specialty:  Cardiology Why:  at 10:20am for your follow up appt.  Contact information: [place] [address] [phone]          Discharge Instructions    Call MD for:  redness, tenderness, or signs of infection (pain, swelling, redness, odor or green/yellow discharge around incision site)    Complete by:  As directed    Diet - low sodium heart healthy    Complete by:  As directed    Discharge instructions    Complete by:  As directed    Radial Site Care Refer to this sheet in the next few weeks. These instructions provide you with information on caring for yourself after your procedure. Your caregiver may also give you more specific instructions. Your treatment has been planned according to current medical practices, but problems sometimes occur. Call your caregiver if you have any problems or  questions after your procedure. HOME CARE INSTRUCTIONS You may shower the day after the procedure.Remove the bandage (dressing) and gently wash the site with plain soap and water.Gently pat the site dry.  Do not apply powder or lotion to the site.  Do not submerge the affected site in water for 3 to 5 days.  Inspect the site at least twice daily.  Do not flex or bend the affected arm for 24 hours.  No lifting over 5 pounds (2.3 kg) for 5 days after your procedure.  Do not drive home if you are discharged the same day of the procedure. Have someone else drive you.  You may drive 24 hours after the procedure unless otherwise instructed by your caregiver.  What to expect: Any bruising will usually fade within 1 to 2 weeks.  Blood that collects in the tissue (hematoma) may be painful to the touch. It should usually decrease in size and tenderness within 1 to 2 weeks.  SEEK IMMEDIATE MEDICAL CARE IF: You have unusual pain at the radial site.  You have redness, warmth, swelling, or pain at the radial site.  You have drainage (other than a small amount of blood on  the dressing).  You have chills.  You have a fever or persistent symptoms for more than 72 hours.  You have a fever and your symptoms suddenly get worse.  Your arm becomes pale, cool, tingly, or numb.  You have heavy bleeding from the site. Hold pressure on the site.   PLEASE DO NOT MISS ANY DOSES OF YOUR PLAVIX!!!!! Also keep a log of you blood pressures and bring back to your follow up appt. Please call the office with any questions.   Patients taking blood thinners should generally stay away from medicines like ibuprofen, Advil, Motrin, naproxen, and Aleve due to risk of stomach bleeding. You may take Tylenol as directed or talk to your primary doctor about alternatives.   Increase activity slowly    Complete by:  As directed       Discharge Medications     Medication List    STOP taking these medications     hydrochlorothiazide 12.5 MG capsule Commonly known as:  MICROZIDE     TAKE these medications   aspirin EC 81 MG tablet Take 1 tablet (81 mg total) by mouth daily. What changed:  how much to take   atorvastatin 80 MG tablet Commonly known as:  LIPITOR Take 1 tablet (80 mg total) by mouth daily at 6 PM.   busPIRone 5 MG tablet Commonly known as:  BUSPAR Take 1 tablet (5 mg total) by mouth 2 (two) times daily as needed.   CHANTIX PO Take 1 tablet by mouth 2 (two) times daily.   clopidogrel 75 MG tablet Commonly known as:  PLAVIX Take 1 tablet (75 mg total) by mouth daily with breakfast.   cyclobenzaprine 10 MG tablet Commonly known as:  FLEXERIL Take 1 tablet (10 mg total) by mouth every 8 (eight) hours as needed.   hyoscyamine 0.125 MG SL tablet Commonly known as:  LEVSIN SL Take 1 tablet (0.125 mg total) by mouth every 6 (six) hours as needed for cramping.   levothyroxine 100 MCG tablet Commonly known as:  SYNTHROID, LEVOTHROID Take 1 tablet (100 mcg total) by mouth daily before breakfast. Need office visit with labs for further refills.   mesalamine 1.2 g EC tablet Commonly known as:  LIALDA TAKE 2 TABLETS (2.4 G TOTAL) BY MOUTH DAILY WITH BREAKFAST.   metoprolol succinate 25 MG 24 hr tablet Commonly known as:  TOPROL-XL Take 1 tablet (25 mg total) by mouth daily.   nitroGLYCERIN 0.4 MG SL tablet Commonly known as:  NITROSTAT Place 1 tablet (0.4 mg total) under the tongue every 5 (five) minutes as needed for chest pain.        Aspirin prescribed at discharge?  Yes High Intensity Statin Prescribed? (Lipitor 40-80mg  or Crestor 20-40mg ): Yes Beta Blocker Prescribed? Yes For EF <40%, was ACEI/ARB Prescribed? No: EF ok ADP Receptor Inhibitor Prescribed? (i.e. Plavix etc.-Includes Medically Managed Patients): Yes For EF <40%, Aldosterone Inhibitor Prescribed? No: EF ok Was EF assessed during THIS hospitalization? Yes Was Cardiac Rehab II ordered? (Included  Medically managed Patients): Yes   Outstanding Labs/Studies   FLP/LFTs in 6 weeks if tolerating statin  Duration of Discharge Encounter   Greater than 30 minutes including physician time.  Signed, Reino Bellis NP-C 02/07/2017, 8:49 AM Agree with note by Reino Bellis NP-C  Postop day #1 mid dominant RCA PCI and drug-eluting stenting. The patient did have chest pain which led to a stress test and ultimately referral for cath. She is a long-term smoker and plans to stop. She  does have moderate residual disease in her mid circumflex and LAD with normal LV function. Her labs are okay post procedure. She is pain-free. Her right radial puncture site is healing well. She stable for discharge home this morning. She will follow-up with Dr. Audry Riles, M.D., [place], [facility], [place] [address] [place], [place]  [address]  [phone] 02/07/2017 8:50 AM

## 2017-02-07 NOTE — Telephone Encounter (Signed)
[facility] called to schedule TCS follow up from Cath... I have patient scheduled for 10/18 for appt.

## 2017-02-07 NOTE — Progress Notes (Signed)
CRITICAL VALUE ALERT  Critical Value:  Troponin=0.10  Date & Time Notied:  02/07/17 (6am)  Provider Notified: Reino Bellis NP  Orders Received/Actions taken: None

## 2017-02-15 ENCOUNTER — Encounter: Payer: Self-pay | Admitting: Cardiology

## 2017-02-15 ENCOUNTER — Ambulatory Visit (INDEPENDENT_AMBULATORY_CARE_PROVIDER_SITE_OTHER): Payer: BLUE CROSS/BLUE SHIELD | Admitting: Cardiology

## 2017-02-15 VITALS — BP 132/78 | HR 71 | Ht 65.0 in | Wt 176.0 lb

## 2017-02-15 DIAGNOSIS — I209 Angina pectoris, unspecified: Secondary | ICD-10-CM | POA: Diagnosis not present

## 2017-02-15 DIAGNOSIS — E785 Hyperlipidemia, unspecified: Secondary | ICD-10-CM | POA: Diagnosis not present

## 2017-02-15 DIAGNOSIS — I10 Essential (primary) hypertension: Secondary | ICD-10-CM | POA: Diagnosis not present

## 2017-02-15 DIAGNOSIS — I2510 Atherosclerotic heart disease of native coronary artery without angina pectoris: Secondary | ICD-10-CM

## 2017-02-15 DIAGNOSIS — F17210 Nicotine dependence, cigarettes, uncomplicated: Secondary | ICD-10-CM | POA: Diagnosis not present

## 2017-02-15 MED ORDER — VARENICLINE TARTRATE 1 MG PO TABS: 1.0000 mg | ORAL_TABLET | Freq: Two times a day (BID) | ORAL | 3 refills | Status: DC

## 2017-02-15 NOTE — Addendum Note (Signed)
Addended by: Mattie Marlin on: 02/15/2017 11:24 AM   Modules accepted: Orders

## 2017-02-15 NOTE — Patient Instructions (Signed)
Medication Instructions:  Your physician recommends that you continue on your current medications as directed. Please refer to the Current Medication list given to you today.   Labwork: Your physician recommends that you have liver/lipid panel and CMP in 1 month- 03/19/2017  Testing/Procedures: None  Follow-Up: 3 months  Any Other Special Instructions Will Be Listed Below (If Applicable).     If you need a refill on your cardiac medications before your next appointment, please call your pharmacy.

## 2017-02-15 NOTE — Progress Notes (Signed)
Cardiology Office Note:    Date:  02/15/2017   ID:  Deborah Stewart, DOB 08-21-1955, MRN [id number]  PCP:  Hoyt Koch, MD  Cardiologist:  Jenean Lindau, MD   Referring MD: Hoyt Koch, *    ASSESSMENT:    1. Angina pectoris ([place])   2. Coronary artery disease involving native coronary artery of native heart without angina pectoris   3. Essential hypertension   4. Cigarette smoker   5. Dyslipidemia    PLAN:    In order of problems listed above:  1. I discussed the findings with coronary angiography with the patient at extensive length. Secondary prevention stress. Importance of compliance with diet and medications stressed and she vocalizes understanding. 2. Her blood pressure is stable 3. Diet was discussed with dyslipidemia. She'll be back in one month for liver lipid check. 4. Importance of exercise stressed exercise protocol outlined. I spent 5 minutes with the patient discussing solely about smoking. Smoking cessation was counseled. I suggested to the patient also different medications and pharmacological interventions. Patient is keen on Continuing Chantix. We will give her a prescription at this time. She will get back to me if he needs any further assistance in this matter.   Medication Adjustments/Labs and Tests Ordered: Current medicines are reviewed at length with the patient today.  Concerns regarding medicines are outlined above.  No orders of the defined types were placed in this encounter.  No orders of the defined types were placed in this encounter.    Chief Complaint  Patient presents with  . Follow-up    post cath      History of Present Illness:    Deborah Stewart is a 61 y.o. female . The patient was evaluated by me for chest tightness suggesting angina. She had multiple stress factors for coronary artery disease including active heavy smoking which she has quit in the past several days. She underwent coronary  angiography with stenting of the right coronary artery and feels much different and much better. She is very happy about this and has started ambulating regularly.At the time of my evaluation she is alert awake oriented and in no distress.  Past Medical History:  Diagnosis Date  . Abnormal Pap smear   . Anxiety   . Arthritis    "knees" (02/06/2017)  . ASCUS on Pap smear   . Breast mass    "I have lumpy breasts" (02/06/2017)  . Coronary artery disease    10/18 PCI/DESx1 to mRCA.   . DDD (degenerative disc disease), cervical    s/p diskectomy   . DDD (degenerative disc disease), lumbar    by MRI  . Diverticulosis of colon (without mention of hemorrhage)   . Dysmenorrhea   . Dyspareunia   . Dysplasia of cervix, low grade (CIN 1)   . Endometriosis   . History of chicken pox   . History of measles   . Hyperlipidemia   . Hypertension   . Pelvic pain in female   . Personal history of colonic polyps 06/2010   hyperplastic, tubular adenoma  . Thyroid nodule   . Ulcerative colitis ([place])   . Unspecified hypothyroidism     Past Surgical History:  Procedure Laterality Date  . ANTERIOR CERVICAL DECOMP/DISCECTOMY FUSION  2004   Dr. Trenton Gammon  . BACK SURGERY    . BREAST BIOPSY Bilateral   . CORONARY ANGIOPLASTY WITH STENT PLACEMENT  02/06/2017  . CORONARY STENT INTERVENTION N/A 02/06/2017   Procedure: CORONARY  STENT INTERVENTION;  Surgeon: Belva Crome, MD;  Location: [facility];  Service: Cardiovascular;  Laterality: N/A;  . FOOT SURGERY Right 09/2011    Dr. Ladora Daniel, DPO, removal of "mass"   . FOOT SURGERY Left 1970s X 1  . FOOT SURGERY Right 2000s "multiple"   "botched 1st time; tried to correct several times"  . LEFT HEART CATH AND CORONARY ANGIOGRAPHY N/A 02/06/2017   Procedure: LEFT HEART CATH AND CORONARY ANGIOGRAPHY;  Surgeon: Belva Crome, MD;  Location: [facility];  Service: Cardiovascular;  Laterality: N/A;  . THYROIDECTOMY, PARTIAL  06/2009   "removed  goiter"  . TOTAL ABDOMINAL HYSTERECTOMY     TOTAL ABDOMINAL HYSTERECTOMY W/ BILATERAL SALPINGOOPHORECTOMY     Current Medications: Current Meds  Medication Sig  . aspirin EC 81 MG tablet Take 1 tablet (81 mg total) by mouth daily.  Marland Kitchen atorvastatin (LIPITOR) 80 MG tablet Take 1 tablet (80 mg total) by mouth daily at 6 PM.  . clopidogrel (PLAVIX) 75 MG tablet Take 1 tablet (75 mg total) by mouth daily with breakfast.  . cyclobenzaprine (FLEXERIL) 10 MG tablet Take 1 tablet (10 mg total) by mouth every 8 (eight) hours as needed.  . hyoscyamine (LEVSIN SL) 0.125 MG SL tablet Take 1 tablet (0.125 mg total) by mouth every 6 (six) hours as needed for cramping.  Marland Kitchen levothyroxine (SYNTHROID, LEVOTHROID) 100 MCG tablet Take 1 tablet (100 mcg total) by mouth daily before breakfast. Need office visit with labs for further refills.  . mesalamine (LIALDA) 1.2 g EC tablet TAKE 2 TABLETS (2.4 G TOTAL) BY MOUTH DAILY WITH BREAKFAST.  . metoprolol succinate (TOPROL-XL) 25 MG 24 hr tablet Take 1 tablet (25 mg total) by mouth daily.  . nitroGLYCERIN (NITROSTAT) 0.4 MG SL tablet Place 1 tablet (0.4 mg total) under the tongue every 5 (five) minutes as needed for chest pain.  . pantoprazole (PROTONIX) 40 MG tablet Take 1 tablet (40 mg total) by mouth daily.  . Varenicline Tartrate (CHANTIX PO) Take 1 tablet by mouth 2 (two) times daily.     Allergies:   Codeine   Social History   Social History  . Marital status: Divorced    Spouse name: N/A  . Number of children: 1  . Years of education: 18   Occupational History  . filed Archivist   Social History Main Topics  . Smoking status: Former Smoker    Packs/day: 0.50    Years: 45.00    Types: Cigarettes    Quit date: 02/06/2017  . Smokeless tobacco: Never Used     Comment: 3 cigs a week---taking Chantix  . Alcohol use 3.0 oz/week    5 Standard drinks or equivalent per week     Comment: 02/06/2017 "might have 1-4 mixed drinks/month"  . Drug  use: No  . Sexual activity: Not Asked   Other Topics Concern  . None   Social History Narrative   HSG, [employer] -BS social work. Married 1980 - 39 yrs/divorced. 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 Dating - long term relationship/[place]. Unprotected. No history of physical or sexual abuse.      Family History: The patient's family history includes Breast cancer in her sister and unknown relative; Diabetes in her brother, father, mother, and unknown relative; Heart disease in her brother, father, and unknown relative; Hyperlipidemia in her brother; Hypertension in her brother and brother; Lung cancer in her unknown relative. There  is no history of Colon cancer.  ROS:   Please see the history of present illness.    All other systems reviewed and are negative.  EKGs/Labs/Other Studies Reviewed:    The following studies were reviewed today: I discussed coronary angiography report with the patient at extensive length.   Recent Labs: 02/01/2017: Hemoglobin 15.4; TSH 2.080 02/06/2017: Platelets 286 02/07/2017: BUN 10; Creatinine, Ser 0.80; Potassium 4.5; Sodium 139  Recent Lipid Panel    Component Value Date/Time   CHOL 197 02/01/2017 1123   TRIG 76 02/01/2017 1123   HDL 45 02/01/2017 1123   CHOLHDL 4.4 02/01/2017 1123   CHOLHDL 6 09/16/2015 1438   VLDL 28.8 09/16/2015 1438   LDLCALC 137 (H) 02/01/2017 1123   LDLDIRECT 112.1 10/11/2012 1554    Physical Exam:    VS:  BP 132/78   Pulse 71   Ht 5\' 5"  (1.651 m)   Wt 176 lb (79.8 kg)   SpO2 96%   BMI 29.29 kg/m     Wt Readings from Last 3 Encounters:  02/15/17 176 lb (79.8 kg)  02/07/17 176 lb 9.6 oz (80.1 kg)  02/01/17 176 lb (79.8 kg)     GEN: Patient is in no acute distress HEENT: Normal NECK: No JVD; No carotid bruits LYMPHATICS: No lymphadenopathy CARDIAC: Hear sounds regular, 2/6 systolic murmur at the apex. RESPIRATORY:  Clear to auscultation without rales, wheezing or rhonchi    ABDOMEN: Soft, non-tender, non-distended MUSCULOSKELETAL:  No edema; No deformity  SKIN: Warm and dry NEUROLOGIC:  Alert and oriented x 3 PSYCHIATRIC:  Normal affect   Signed, Jenean Lindau, MD  02/15/2017 11:10 AM    [place]

## 2017-04-17 ENCOUNTER — Other Ambulatory Visit: Payer: Self-pay | Admitting: Internal Medicine

## 2017-05-08 ENCOUNTER — Other Ambulatory Visit: Payer: BLUE CROSS/BLUE SHIELD

## 2017-05-08 ENCOUNTER — Ambulatory Visit: Payer: BLUE CROSS/BLUE SHIELD | Admitting: Gastroenterology

## 2017-05-08 ENCOUNTER — Encounter: Payer: Self-pay | Admitting: Gastroenterology

## 2017-05-08 VITALS — BP 126/70 | HR 56 | Ht 64.0 in | Wt 184.0 lb

## 2017-05-08 DIAGNOSIS — Z86010 Personal history of colonic polyps: Secondary | ICD-10-CM

## 2017-05-08 DIAGNOSIS — K5730 Diverticulosis of large intestine without perforation or abscess without bleeding: Secondary | ICD-10-CM | POA: Diagnosis not present

## 2017-05-08 DIAGNOSIS — R140 Abdominal distension (gaseous): Secondary | ICD-10-CM | POA: Diagnosis not present

## 2017-05-08 MED ORDER — HYOSCYAMINE SULFATE 0.125 MG SL SUBL: 0.1250 mg | SUBLINGUAL_TABLET | Freq: Four times a day (QID) | SUBLINGUAL | 11 refills | Status: DC | PRN

## 2017-05-08 NOTE — Patient Instructions (Addendum)
See your cardiologist regarding coming off your Plavix x 5 days prior to a Colonoscopy. Please call our office once you have seen your Cardiologist.   We have sent the following medications to your pharmacy for you to pick up at your convenience: [place].  You have been given a gas prevention diet.   You can take over the counter gas-x four times a day as needed for gas and bloating.   We have sent the following medications to your pharmacy for you to pick up at your convenience: [place].  Normal BMI (Body Mass Index- based on height and weight) is between 19 and 25. Your BMI today is Body mass index is 31.58 kg/m. Marland Kitchen Please consider follow up  regarding your BMI with your Primary Care Provider.  Thank you for choosing me and [facility].  Pricilla Riffle. Dagoberto Ligas., MD., [facility] [place]al

## 2017-05-08 NOTE — Progress Notes (Signed)
    History of Present Illness: This is a 62 year old female here for the evaluation of IBS, SCAD and a history of adenomatous colon polyps. She is maintained on Plavix and ASA following RCA stent placement in October 2018.  Her discharge summary states at least 6 months of aspirin and Plavix and optimally 12 months.  She had mild constipation a few weeks ago and noted a small amount of rectal bleeding with bowel movements.  Her constipation has resolved and she has not had any more rectal bleeding.  She has not been taking Lialda. She has occasional flares of abdominal cramping that are significantly improved with hyoscyamine.  She complains of intestinal gas with flatus.    Current Medications, Allergies, Past Medical History, Past Surgical History, Family History and Social History were reviewed in [employer] record.  Physical Exam: General: Well developed, well nourished, no acute distress Head: Normocephalic and atraumatic Eyes:  sclerae anicteric, EOMI Ears: Normal auditory acuity Mouth: No deformity or lesions Neck: Supple, no masses or thyromegaly Lungs: Clear throughout to auscultation Heart: Regular rate and rhythm; no murmurs, rubs or bruits Abdomen: Soft, non tender and non distended. No masses, hepatosplenomegaly or hernias noted. Normal Bowel sounds Musculoskeletal: Symmetrical with no gross deformities  Skin: No lesions on visible extremities Pulses:  Normal pulses noted Extremities: No clubbing, cyanosis, edema or deformities noted Neurological: Alert oriented x 4, grossly nonfocal Cervical Nodes:  No significant cervical adenopathy Inguinal Nodes: No significant inguinal adenopathy Psychological:  Alert and cooperative. Normal mood and affect  Assessment and Recommendations:  1.  Personal history of adenomatous colon polyps.  She is overdue for a 5-year surveillance colonoscopy.  Await cardiac follow up and cardiac clearance to determine timing of  her colonoscopy based on the safety of holding Plavix for an elective colonoscopy.  2.  History of severe left colon diverticulosis with SCAD. Currently asymptomatic. Expectant management.  3. IBS. Levsin 1-2 qid prn.   4. RCA stent on Plavix and ASA. See #1.  5. Gas and flatus. Low gas diet and Gas-X 4 times daily as needed.

## 2017-05-09 ENCOUNTER — Other Ambulatory Visit: Payer: BLUE CROSS/BLUE SHIELD | Admitting: *Deleted

## 2017-05-09 LAB — COMPREHENSIVE METABOLIC PANEL
ALT: 26 IU/L (ref 0–32)
AST: 23 IU/L (ref 0–40)
Albumin/Globulin Ratio: 1.6 (ref 1.2–2.2)
Albumin: 4.3 g/dL (ref 3.6–4.8)
Alkaline Phosphatase: 95 IU/L (ref 39–117)
BUN/Creatinine Ratio: 11 — ABNORMAL LOW (ref 12–28)
BUN: 8 mg/dL (ref 8–27)
Bilirubin Total: 0.5 mg/dL (ref 0.0–1.2)
CO2: 24 mmol/L (ref 20–29)
Calcium: 9.4 mg/dL (ref 8.7–10.3)
Chloride: 102 mmol/L (ref 96–106)
Creatinine, Ser: 0.74 mg/dL (ref 0.57–1.00)
GFR calc Af Amer: 101 mL/min/{1.73_m2} (ref >59)
GFR calc non Af Amer: 88 mL/min/{1.73_m2} (ref >59)
Globulin, Total: 2.7 g/dL (ref 1.5–4.5)
Glucose: 80 mg/dL (ref 65–99)
Potassium: 4.4 mmol/L (ref 3.5–5.2)
Sodium: 141 mmol/L (ref 134–144)
Total Protein: 7 g/dL (ref 6.0–8.5)

## 2017-05-09 LAB — LIPID PANEL
Chol/HDL Ratio: 3.2 ratio (ref 0.0–4.4)
Cholesterol, Total: 117 mg/dL (ref 100–199)
HDL: 37 mg/dL — ABNORMAL LOW (ref >39)
LDL Calculated: 65 mg/dL (ref 0–99)
Triglycerides: 76 mg/dL (ref 0–149)
VLDL Cholesterol Cal: 15 mg/dL (ref 5–40)

## 2017-05-09 LAB — HEPATIC FUNCTION PANEL: Bilirubin, Direct: 0.15 mg/dL (ref 0.00–0.40)

## 2017-05-10 ENCOUNTER — Other Ambulatory Visit: Payer: Self-pay

## 2017-05-10 ENCOUNTER — Telehealth: Payer: Self-pay | Admitting: Cardiology

## 2017-05-10 MED ORDER — METOPROLOL SUCCINATE ER 25 MG PO TB24: 25.0000 mg | ORAL_TABLET | Freq: Every day | ORAL | 1 refills | Status: DC

## 2017-05-10 NOTE — Telephone Encounter (Signed)
Refill sent.

## 2017-05-10 NOTE — Telephone Encounter (Signed)
New message     *STAT* If patient is at the pharmacy, call can be transferred to refill team.   1. Which medications need to be refilled? (please list name of each medication and dose if known) metoprolol succinate (TOPROL-XL) 25 MG 24 hr tablet  2. Which pharmacy/location (including street and city if local pharmacy) is medication to be sent to?[facility] [address]  3. Do they need a 30 day or 90 day supply? [place]

## 2017-05-15 ENCOUNTER — Encounter: Payer: Self-pay | Admitting: Internal Medicine

## 2017-05-15 ENCOUNTER — Ambulatory Visit: Payer: BLUE CROSS/BLUE SHIELD | Admitting: Internal Medicine

## 2017-05-15 DIAGNOSIS — Z87891 Personal history of nicotine dependence: Secondary | ICD-10-CM | POA: Diagnosis not present

## 2017-05-15 DIAGNOSIS — F419 Anxiety disorder, unspecified: Secondary | ICD-10-CM | POA: Diagnosis not present

## 2017-05-15 DIAGNOSIS — I10 Essential (primary) hypertension: Secondary | ICD-10-CM

## 2017-05-15 NOTE — Progress Notes (Signed)
   Subjective:    Patient ID: Deborah Stewart, female    DOB: May 15, 1955, 62 y.o.   MRN: [id number]  HPI The patient is a 62 YO female coming in for follow up of her fatigue and chest pain/palpitations. Since our last visit she had stress testing and cath with stent placement. This has alleviated her symptoms. She has stopped smoking as a result of her health. She is newly motivated to work on her health. Her weight is up several pounds with smoking cessation and holidays. She is overall feeling better than last visit. She is feeling less anxious now that she knows what was going on with her health.   Review of Systems  Constitutional: Negative.   HENT: Negative.   Eyes: Negative.   Respiratory: Negative for cough, chest tightness and shortness of breath.   Cardiovascular: Negative for chest pain, palpitations and leg swelling.  Gastrointestinal: Negative for abdominal distention, abdominal pain, constipation, diarrhea, nausea and vomiting.  Musculoskeletal: Negative.   Skin: Negative.   Neurological: Negative.   Psychiatric/Behavioral: Negative.       Objective:   Physical Exam  Constitutional: She is oriented to person, place, and time. She appears well-developed and well-nourished.  HENT:  Head: Normocephalic and atraumatic.  Eyes: EOM are normal.  Neck: Normal range of motion.  Cardiovascular: Normal rate and regular rhythm.  Pulmonary/Chest: Effort normal and breath sounds normal. No respiratory distress. She has no wheezes. She has no rales.  Abdominal: Soft. Bowel sounds are normal. She exhibits no distension. There is no tenderness. There is no rebound.  Musculoskeletal: She exhibits no edema.  Neurological: She is alert and oriented to person, place, and time. Coordination normal.  Skin: Skin is warm and dry.  Psychiatric: She has a normal mood and affect.   Vitals:   05/15/17 1102  BP: 110/70  Pulse: 60  Temp: 98.1 F (36.7 C)  TempSrc: Oral  SpO2: 100%    Weight: 185 lb (83.9 kg)  Height: 5\' 4"  (1.626 m)      Assessment & Plan:

## 2017-05-15 NOTE — Patient Instructions (Signed)
Keep up the good work with not smoking! This is the most important thing for your health now.

## 2017-05-16 ENCOUNTER — Encounter: Payer: Self-pay | Admitting: Cardiology

## 2017-05-16 ENCOUNTER — Ambulatory Visit: Payer: BLUE CROSS/BLUE SHIELD | Admitting: Cardiology

## 2017-05-16 VITALS — BP 120/80 | HR 58 | Ht 65.0 in | Wt 185.4 lb

## 2017-05-16 DIAGNOSIS — I10 Essential (primary) hypertension: Secondary | ICD-10-CM | POA: Diagnosis not present

## 2017-05-16 DIAGNOSIS — I2510 Atherosclerotic heart disease of native coronary artery without angina pectoris: Secondary | ICD-10-CM | POA: Diagnosis not present

## 2017-05-16 DIAGNOSIS — Z87891 Personal history of nicotine dependence: Secondary | ICD-10-CM

## 2017-05-16 DIAGNOSIS — E785 Hyperlipidemia, unspecified: Secondary | ICD-10-CM

## 2017-05-16 NOTE — Assessment & Plan Note (Signed)
This has resolved on its own now that she is not having the chest pains and palpitations from her cardiac disease.

## 2017-05-16 NOTE — Assessment & Plan Note (Signed)
BP at goal on her metoprolol and no changes today. Most recent CMP without indication for change. HR 60.

## 2017-05-16 NOTE — Progress Notes (Signed)
Cardiology Office Note:    Date:  05/16/2017   ID:  Deborah Stewart, DOB January 26, 1956, MRN [id number]  PCP:  Hoyt Koch, MD  Cardiologist:  Jenean Lindau, MD   Referring MD: Hoyt Koch, *    ASSESSMENT:    1. Coronary artery disease involving native coronary artery of native heart without angina pectoris   2. Essential hypertension   3. Dyslipidemia   4. Former smoker    PLAN:    In order of problems listed above:  1. Secondary prevention stressed and compliance with diet and medications stressed and she vocalized understanding.  Sublingual nitroglycerin were explained and she carries with her meticulously all the time. 2. I reviewed her lipids diet was discussed.  Importance of regular exercise stressed weight reduction was stressed and she plans to be dedicated to exercise and weight loss in the coming months. 3. Patient will be seen in follow-up appointment in 4 months or earlier if the patient has any concerns    Medication Adjustments/Labs and Tests Ordered: Current medicines are reviewed at length with the patient today.  Concerns regarding medicines are outlined above.  No orders of the defined types were placed in this encounter.  No orders of the defined types were placed in this encounter.    Chief Complaint  Patient presents with  . Follow-up     History of Present Illness:    Deborah Stewart is a 62 y.o. female.  She has coronary artery disease and post right coronary artery stenting.  She has now quit smoking since the past 3 months and is happy about it.  She mentions to me that she had a lot of things going on in her family especially with her parents and she takes care of them and also works on a full-time basis which is why she has not been meticulous with her weight management and exercise.  No chest pain orthopnea or PND at the time of my evaluation, the patient is alert awake oriented and in no distress.  Past Medical  History:  Diagnosis Date  . Abnormal Pap smear   . Anxiety   . Arthritis    "knees" (02/06/2017)  . ASCUS on Pap smear   . Breast mass    "I have lumpy breasts" (02/06/2017)  . Coronary artery disease    10/18 PCI/DESx1 to mRCA.   . DDD (degenerative disc disease), cervical    s/p diskectomy   . DDD (degenerative disc disease), lumbar    by MRI  . Diverticulosis of colon (without mention of hemorrhage)   . Dysmenorrhea   . Dyspareunia   . Dysplasia of cervix, low grade (CIN 1)   . Endometriosis   . History of chicken pox   . History of measles   . Hyperlipidemia   . Hypertension   . Pelvic pain in female   . Personal history of colonic polyps 06/2010   hyperplastic, tubular adenoma  . Thyroid nodule   . Ulcerative colitis ([place])   . Unspecified hypothyroidism     Past Surgical History:  Procedure Laterality Date  . ANTERIOR CERVICAL DECOMP/DISCECTOMY FUSION  2004   Dr. Trenton Gammon  . BACK SURGERY    . BREAST BIOPSY Bilateral   . CORONARY ANGIOPLASTY WITH STENT PLACEMENT  02/06/2017  . CORONARY STENT INTERVENTION N/A 02/06/2017   Procedure: CORONARY STENT INTERVENTION;  Surgeon: Belva Crome, MD;  Location: [facility];  Service: Cardiovascular;  Laterality: N/A;  . FOOT  SURGERY Right 09/2011    Dr. Ladora Daniel, DPO, removal of "mass"   . FOOT SURGERY Left 1970s X 1  . FOOT SURGERY Right 2000s "multiple"   "botched 1st time; tried to correct several times"  . LEFT HEART CATH AND CORONARY ANGIOGRAPHY N/A 02/06/2017   Procedure: LEFT HEART CATH AND CORONARY ANGIOGRAPHY;  Surgeon: Belva Crome, MD;  Location: [facility];  Service: Cardiovascular;  Laterality: N/A;  . THYROIDECTOMY, PARTIAL  06/2009   "removed goiter"  . TOTAL ABDOMINAL HYSTERECTOMY     TOTAL ABDOMINAL HYSTERECTOMY W/ BILATERAL SALPINGOOPHORECTOMY     Current Medications: Current Meds  Medication Sig  . aspirin EC 81 MG tablet Take 1 tablet (81 mg total) by mouth daily.  Marland Kitchen atorvastatin  (LIPITOR) 80 MG tablet Take 1 tablet (80 mg total) by mouth daily at 6 PM.  . clopidogrel (PLAVIX) 75 MG tablet Take 1 tablet (75 mg total) by mouth daily with breakfast.  . cyclobenzaprine (FLEXERIL) 10 MG tablet Take 1 tablet (10 mg total) by mouth every 8 (eight) hours as needed.  . hyoscyamine (LEVSIN SL) 0.125 MG SL tablet Take 1 tablet (0.125 mg total) by mouth every 6 (six) hours as needed for cramping.  Marland Kitchen levothyroxine (SYNTHROID, LEVOTHROID) 100 MCG tablet TAKE 1 TABLET DAILY BEFORE BREAKFAST  . mesalamine (LIALDA) 1.2 g EC tablet TAKE 2 TABLETS (2.4 G TOTAL) BY MOUTH DAILY WITH BREAKFAST.  . metoprolol succinate (TOPROL-XL) 25 MG 24 hr tablet Take 1 tablet (25 mg total) by mouth daily.  . varenicline (CHANTIX) 1 MG tablet Take 1 tablet (1 mg total) by mouth 2 (two) times daily.     Allergies:   Codeine   Social History   Socioeconomic History  . Marital status: Divorced    Spouse name: None  . Number of children: 1  . Years of education: 78  . Highest education level: None  Social Needs  . Financial resource strain: None  . Food insecurity - worry: None  . Food insecurity - inability: None  . Transportation needs - medical: None  . Transportation needs - non-medical: None  Occupational History  . Occupation: filed Nurse, mental health: [employer]  Tobacco Use  . Smoking status: Former Smoker    Packs/day: 0.50    Years: 45.00    Pack years: 22.50    Types: Cigarettes    Last attempt to quit: 02/06/2017    Years since quitting: 0.2  . Smokeless tobacco: Never Used  . Tobacco comment: 3 cigs a week---taking Chantix  Substance and Sexual Activity  . Alcohol use: Yes    Alcohol/week: 3.0 oz    Types: 5 Standard drinks or equivalent per week    Comment: 02/06/2017 "might have 1-4 mixed drinks/month"  . Drug use: No  . Sexual activity: None  Other Topics Concern  . None  Social History Narrative   HSG, [employer] -BS social work. Married 1980 - 74 yrs/divorced. 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 Dating - long term relationship/[place]. Unprotected. No history of physical or sexual abuse.      Family History: The patient's family history includes Breast cancer in her sister and unknown relative; Diabetes in her brother, father, mother, and unknown relative; Heart disease in her brother, father, and unknown relative; Hyperlipidemia in her brother; Hypertension in her brother and brother; Lung cancer in her unknown relative. There is no history of Colon cancer.  ROS:  Please see the history of present illness.    All other systems reviewed and are negative.  EKGs/Labs/Other Studies Reviewed:    The following studies were reviewed today: I reviewed records including lab work and lipids discussed them with her at extensive length.   Recent Labs: 02/01/2017: Hemoglobin 15.4; TSH 2.080 02/06/2017: Platelets 286 05/09/2017: ALT 26; BUN 8; Creatinine, Ser 0.74; Potassium 4.4; Sodium 141  Recent Lipid Panel    Component Value Date/Time   CHOL 117 05/09/2017 1235   TRIG 76 05/09/2017 1235   HDL 37 (L) 05/09/2017 1235   CHOLHDL 3.2 05/09/2017 1235   CHOLHDL 6 09/16/2015 1438   VLDL 28.8 09/16/2015 1438   LDLCALC 65 05/09/2017 1235   LDLDIRECT 112.1 10/11/2012 1554    Physical Exam:    VS:  BP 120/80   Pulse (!) 58   Ht 5\' 5"  (1.651 m)   Wt 185 lb 6.4 oz (84.1 kg)   SpO2 98%   BMI 30.85 kg/m     Wt Readings from Last 3 Encounters:  05/16/17 185 lb 6.4 oz (84.1 kg)  05/15/17 185 lb (83.9 kg)  05/08/17 184 lb (83.5 kg)     GEN: Patient is in no acute distress HEENT: Normal NECK: No JVD; No carotid bruits LYMPHATICS: No lymphadenopathy CARDIAC: Hear sounds regular, 2/6 systolic murmur at the apex. RESPIRATORY:  Clear to auscultation without rales, wheezing or rhonchi  ABDOMEN: Soft, non-tender, non-distended MUSCULOSKELETAL:  No edema; No deformity  SKIN: Warm and dry NEUROLOGIC:  Alert and oriented x  3 PSYCHIATRIC:  Normal affect   Signed, Jenean Lindau, MD  05/16/2017 10:20 AM    [place]

## 2017-05-16 NOTE — Assessment & Plan Note (Signed)
Have congratulated her on quitting. This is the most important thing she can do for her health right now. We have talked about the fact that most people gain weight with smoking cessation and it is still worth quitting. I would like her to focus on smoking cessation for now and work on the weight once this is fully established.

## 2017-05-16 NOTE — Patient Instructions (Signed)
Medication Instructions:  Your physician recommends that you continue on your current medications as directed. Please refer to the Current Medication list given to you today.  Labwork: None  Testing/Procedures: None  Follow-Up: Your physician recommends that you schedule a follow-up appointment in: 4 months  Any Other Special Instructions Will Be Listed Below (If Applicable).     If you need a refill on your cardiac medications before your next appointment, please call your pharmacy.   [facility]  Ashley A, RN, BSN  

## 2017-05-18 ENCOUNTER — Other Ambulatory Visit: Payer: Self-pay

## 2017-05-18 ENCOUNTER — Telehealth: Payer: Self-pay | Admitting: Cardiology

## 2017-05-18 MED ORDER — ATORVASTATIN CALCIUM 80 MG PO TABS: 80.0000 mg | ORAL_TABLET | Freq: Every day | ORAL | 1 refills | Status: DC

## 2017-05-18 NOTE — Telephone Encounter (Signed)
Patient needs her lipitor called into [facility] on [address] [place]

## 2017-05-18 NOTE — Telephone Encounter (Signed)
Med refill sent

## 2017-05-24 ENCOUNTER — Telehealth: Payer: Self-pay | Admitting: Cardiology

## 2017-05-24 NOTE — Telephone Encounter (Signed)
1. Avoid all over-the-counter antihistamines except Claritin/Loratadine and Zyrtec/Cetrizine. 2. Avoid all combination including cold sinus allergies flu decongestant and sleep medications 3. You can use Robitussin DM Mucinex and Mucinex DM for cough. 4. can use Tylenol aspirin ibuprofen and naproxen but no combinations such as sleep or sinus.  Informed patient via voicemail by the guidelines above.

## 2017-05-24 NOTE — Telephone Encounter (Signed)
Please call patient to discuss medication for her cold. BP is 140/98 and she wanted to make sure to take the right thing

## 2017-08-10 ENCOUNTER — Encounter (INDEPENDENT_AMBULATORY_CARE_PROVIDER_SITE_OTHER): Payer: Self-pay

## 2017-08-14 LAB — HM DEXA SCAN: HM Dexa Scan: -3.3

## 2017-08-14 LAB — HM MAMMOGRAPHY

## 2017-08-16 ENCOUNTER — Other Ambulatory Visit: Payer: Self-pay | Admitting: Obstetrics and Gynecology

## 2017-08-16 ENCOUNTER — Ambulatory Visit
Admission: RE | Admit: 2017-08-16 | Discharge: 2017-08-16 | Disposition: A | Discharge status: Home / Self Care | Payer: BLUE CROSS/BLUE SHIELD | Source: Ambulatory Visit | Attending: Obstetrics and Gynecology | Admitting: Obstetrics and Gynecology

## 2017-08-16 DIAGNOSIS — M810 Age-related osteoporosis without current pathological fracture: Secondary | ICD-10-CM

## 2017-08-25 ENCOUNTER — Other Ambulatory Visit: Payer: Self-pay | Admitting: Cardiology

## 2017-08-27 ENCOUNTER — Other Ambulatory Visit: Payer: Self-pay | Admitting: Internal Medicine

## 2017-09-25 ENCOUNTER — Ambulatory Visit (INDEPENDENT_AMBULATORY_CARE_PROVIDER_SITE_OTHER): Payer: BLUE CROSS/BLUE SHIELD | Admitting: Cardiology

## 2017-09-25 ENCOUNTER — Encounter: Payer: Self-pay | Admitting: Cardiology

## 2017-09-25 VITALS — BP 122/80 | HR 65 | Ht 65.0 in | Wt 182.0 lb

## 2017-09-25 DIAGNOSIS — I10 Essential (primary) hypertension: Secondary | ICD-10-CM

## 2017-09-25 DIAGNOSIS — E785 Hyperlipidemia, unspecified: Secondary | ICD-10-CM | POA: Diagnosis not present

## 2017-09-25 DIAGNOSIS — I2510 Atherosclerotic heart disease of native coronary artery without angina pectoris: Secondary | ICD-10-CM

## 2017-09-25 DIAGNOSIS — N952 Postmenopausal atrophic vaginitis: Secondary | ICD-10-CM

## 2017-09-25 DIAGNOSIS — N9089 Other specified noninflammatory disorders of vulva and perineum: Secondary | ICD-10-CM

## 2017-09-25 HISTORY — DX: Postmenopausal atrophic vaginitis: N95.2

## 2017-09-25 HISTORY — DX: Other specified noninflammatory disorders of vulva and perineum: N90.89

## 2017-09-25 NOTE — Patient Instructions (Signed)

## 2017-09-25 NOTE — Progress Notes (Signed)
Cardiology Office Note:    Date:  09/25/2017   ID:  Deborah Stewart, DOB [birth year], MRN [id number]  PCP:  Hoyt Koch, MD  Cardiologist:  Jenean Lindau, MD   Referring MD: Hoyt Koch, *    ASSESSMENT:    1. Essential hypertension   2. Coronary artery disease involving native coronary artery of native heart without angina pectoris   3. Dyslipidemia    PLAN:    In order of problems listed above:  1. Secondary prevention stressed with the patient.  Importance of compliance with diet and medications stressed and she vocalized understanding.  Blood pressure is stable. 2. Lipids were reviewed.  Diet was discussed.  I told her to get a blood work done at her primary care physician by the next month or so.  She is going to have a visit with her primary care provider in the next few weeks anyway.  She is not fasting this morning. 3. Patient will be seen in follow-up appointment in 6 months or earlier if the patient has any concerns    Medication Adjustments/Labs and Tests Ordered: Current medicines are reviewed at length with the patient today.  Concerns regarding medicines are outlined above.  No orders of the defined types were placed in this encounter.  No orders of the defined types were placed in this encounter.    Chief Complaint  Patient presents with  . Follow-up  . Coronary Artery Disease     History of Present Illness:    Deborah Stewart is a 62 y.o. female.  The patient has known coronary artery disease.  She denies any problems at this time and takes care of activities of daily living.  No chest pain orthopnea or PND.  Patient mentions to me that she is living an active lifestyle.  Her ambulation is only limited by her back issues.  At the time of my evaluation, the patient is alert awake oriented and in no distress.  Past Medical History:  Diagnosis Date  . Abnormal Pap smear   . Anxiety   . Arthritis    "knees" (02/06/2017)  .  ASCUS on Pap smear   . Breast mass    "I have lumpy breasts" (02/06/2017)  . Coronary artery disease    10/18 PCI/DESx1 to mRCA.   . DDD (degenerative disc disease), cervical    s/p diskectomy   . DDD (degenerative disc disease), lumbar    by MRI  . Diverticulosis of colon (without mention of hemorrhage)   . Dysmenorrhea   . Dyspareunia   . Dysplasia of cervix, low grade (CIN 1)   . Endometriosis   . History of chicken pox   . History of measles   . Hyperlipidemia   . Hypertension   . Pelvic pain in female   . Personal history of colonic polyps 06/2010   hyperplastic, tubular adenoma  . Thyroid nodule   . Ulcerative colitis ([place])   . Unspecified hypothyroidism     Past Surgical History:  Procedure Laterality Date  . ANTERIOR CERVICAL DECOMP/DISCECTOMY FUSION  2004   Dr. Trenton Gammon  . BACK SURGERY    . BREAST BIOPSY Bilateral   . CORONARY ANGIOPLASTY WITH STENT PLACEMENT  02/06/2017  . CORONARY STENT INTERVENTION N/A 02/06/2017   Procedure: CORONARY STENT INTERVENTION;  Surgeon: Belva Crome, MD;  Location: [facility];  Service: Cardiovascular;  Laterality: N/A;  . FOOT SURGERY Right 09/2011    Dr. Ladora Daniel, DPO, removal  of "mass"   . FOOT SURGERY Left 1970s X 1  . FOOT SURGERY Right 2000s "multiple"   "botched 1st time; tried to correct several times"  . LEFT HEART CATH AND CORONARY ANGIOGRAPHY N/A 02/06/2017   Procedure: LEFT HEART CATH AND CORONARY ANGIOGRAPHY;  Surgeon: Belva Crome, MD;  Location: [facility];  Service: Cardiovascular;  Laterality: N/A;  . THYROIDECTOMY, PARTIAL  06/2009   "removed goiter"  . TOTAL ABDOMINAL HYSTERECTOMY     TOTAL ABDOMINAL HYSTERECTOMY W/ BILATERAL SALPINGOOPHORECTOMY     Current Medications: Current Meds  Medication Sig  . aspirin EC 81 MG tablet Take 1 tablet (81 mg total) by mouth daily.  Marland Kitchen atorvastatin (LIPITOR) 80 MG tablet Take 1 tablet (80 mg total) by mouth daily at 6 PM.  . clopidogrel (PLAVIX) 75  MG tablet Take 1 tablet (75 mg total) by mouth daily with breakfast.  . cyclobenzaprine (FLEXERIL) 10 MG tablet Take 1 tablet (10 mg total) by mouth every 8 (eight) hours as needed.  . hyoscyamine (LEVSIN SL) 0.125 MG SL tablet Take 1 tablet (0.125 mg total) by mouth every 6 (six) hours as needed for cramping.  Marland Kitchen levothyroxine (SYNTHROID, LEVOTHROID) 100 MCG tablet TAKE 1 TABLET DAILY BEFORE BREAKFAST  . mesalamine (LIALDA) 1.2 g EC tablet TAKE 2 TABLETS (2.4 G TOTAL) BY MOUTH DAILY WITH BREAKFAST. (Patient taking differently: Take 1.2 g by mouth as needed. TAKE 2 TABLETS (2.4 G TOTAL) BY MOUTH DAILY WITH BREAKFAST.)  . metoprolol succinate (TOPROL-XL) 25 MG 24 hr tablet Take 1 tablet (25 mg total) by mouth daily.  . pantoprazole (PROTONIX) 40 MG tablet Take 40 mg by mouth daily.  . varenicline (CHANTIX) 1 MG tablet Take 1 tablet (1 mg total) by mouth 2 (two) times daily.     Allergies:   Codeine   Social History   Socioeconomic History  . Marital status: Divorced    Spouse name: Not on file  . Number of children: 1  . Years of education: 29  . Highest education level: Not on file  Occupational History  . Occupation: filed Nurse, mental health: [employer]  Social Needs  . Financial resource strain: Not on file  . Food insecurity:    Worry: Not on file    Inability: Not on file  . Transportation needs:    Medical: Not on file    Non-medical: Not on file  Tobacco Use  . Smoking status: Former Smoker    Packs/day: 0.50    Years: 45.00    Pack years: 22.50    Types: Cigarettes    Last attempt to quit: 02/06/2017    Years since quitting: 0.6  . Smokeless tobacco: Never Used  . Tobacco comment: 3 cigs a week---taking Chantix  Substance and Sexual Activity  . Alcohol use: Yes    Alcohol/week: 3.0 oz    Types: 5 Standard drinks or equivalent per week    Comment: 02/06/2017 "might have 1-4 mixed drinks/month"  . Drug use: No  . Sexual activity: Not on file  Lifestyle  . Physical  activity:    Days per week: Not on file    Minutes per session: Not on file  . Stress: Not on file  Relationships  . Social connections:    Talks on phone: Not on file    Gets together: Not on file    Attends religious service: Not on file    Active member of club or organization: Not on file  Attends meetings of clubs or organizations: Not on file    Relationship status: Not on file  Other Topics Concern  . Not on file  Social History Narrative   HSG, [employer] -BS social work. Married 1980 - 33 yrs/divorced. 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 Dating - long term relationship/[place]. Unprotected. No history of physical or sexual abuse.      Family History: The patient's family history includes Breast cancer in her sister and unknown relative; Diabetes in her brother, father, mother, and unknown relative; Heart disease in her brother, father, and unknown relative; Hyperlipidemia in her brother; Hypertension in her brother and brother; Lung cancer in her unknown relative. There is no history of Colon cancer.  ROS:   Please see the history of present illness.    All other systems reviewed and are negative.  EKGs/Labs/Other Studies Reviewed:    The following studies were reviewed today: I discussed my findings with the patient at extensive length.   Recent Labs: 02/01/2017: Hemoglobin 15.4; TSH 2.080 02/06/2017: Platelets 286 05/09/2017: ALT 26; BUN 8; Creatinine, Ser 0.74; Potassium 4.4; Sodium 141  Recent Lipid Panel    Component Value Date/Time   CHOL 117 05/09/2017 1235   TRIG 76 05/09/2017 1235   HDL 37 (L) 05/09/2017 1235   CHOLHDL 3.2 05/09/2017 1235   CHOLHDL 6 09/16/2015 1438   VLDL 28.8 09/16/2015 1438   LDLCALC 65 05/09/2017 1235   LDLDIRECT 112.1 10/11/2012 1554    Physical Exam:    VS:  BP 122/80 (BP Location: Right Arm, Patient Position: Sitting, Cuff Size: Normal)   Pulse 65   Ht 5\' 5"  (1.651 m)   Wt 182 lb (82.6 kg)    SpO2 98%   BMI 30.29 kg/m     Wt Readings from Last 3 Encounters:  09/25/17 182 lb (82.6 kg)  05/16/17 185 lb 6.4 oz (84.1 kg)  05/15/17 185 lb (83.9 kg)     GEN: Patient is in no acute distress HEENT: Normal NECK: No JVD; No carotid bruits LYMPHATICS: No lymphadenopathy CARDIAC: Hear sounds regular, 2/6 systolic murmur at the apex. RESPIRATORY:  Clear to auscultation without rales, wheezing or rhonchi  ABDOMEN: Soft, non-tender, non-distended MUSCULOSKELETAL:  No edema; No deformity  SKIN: Warm and dry NEUROLOGIC:  Alert and oriented x 3 PSYCHIATRIC:  Normal affect   Signed, Jenean Lindau, MD  09/25/2017 10:25 AM    [place]

## 2017-10-26 ENCOUNTER — Other Ambulatory Visit: Payer: Self-pay | Admitting: Cardiology

## 2017-11-11 ENCOUNTER — Other Ambulatory Visit: Payer: Self-pay | Admitting: Internal Medicine

## 2017-11-12 ENCOUNTER — Encounter: Payer: Self-pay | Admitting: Internal Medicine

## 2017-11-12 ENCOUNTER — Ambulatory Visit: Payer: BLUE CROSS/BLUE SHIELD | Admitting: Internal Medicine

## 2017-11-12 DIAGNOSIS — M545 Low back pain, unspecified: Secondary | ICD-10-CM

## 2017-11-12 DIAGNOSIS — G8929 Other chronic pain: Secondary | ICD-10-CM

## 2017-11-12 DIAGNOSIS — E039 Hypothyroidism, unspecified: Secondary | ICD-10-CM | POA: Diagnosis not present

## 2017-11-12 DIAGNOSIS — Z87891 Personal history of nicotine dependence: Secondary | ICD-10-CM

## 2017-11-12 MED ORDER — CYCLOBENZAPRINE HCL 10 MG PO TABS: 10.0000 mg | ORAL_TABLET | Freq: Three times a day (TID) | ORAL | 6 refills | Status: DC | PRN

## 2017-11-12 MED ORDER — TRAMADOL HCL 50 MG PO TABS: 50.0000 mg | ORAL_TABLET | Freq: Every day | ORAL | 0 refills | Status: DC

## 2017-11-12 NOTE — Patient Instructions (Signed)
We will see you back in about 6 months for a physical.

## 2017-11-12 NOTE — Progress Notes (Signed)
   Subjective:    Patient ID: Deborah Stewart, female    DOB: Sep 03, 1955, 62 y.o.   MRN: [id number]  HPI The patient is a 62 YO female coming in for follow up of smoking cessation. She is still without any cigarettes and plans to not smoke again. She is off chantix but will take a pill when she gets a strong craving. No patches or lozenges.  She is also having a flare of her back pain. Uses flexeril and tramadol rarely when this happens. Has been a long time since refill for either. She denies numbness or tingling pains in her legs. The pain stays in low and sometimes middle back. Denies falls, is not working currently but planning to go on retirement at 49. Started about 1-2 weeks ago and overall worsening. 7/10 at worst. Has tried home flexeril and aleve and these are helping a small amount.   Review of Systems  Constitutional: Positive for activity change. Negative for appetite change, chills, fatigue, fever and unexpected weight change.  HENT: Negative.   Eyes: Negative.   Respiratory: Negative.   Cardiovascular: Negative.   Gastrointestinal: Negative.   Musculoskeletal: Positive for arthralgias, back pain and myalgias. Negative for gait problem and joint swelling.  Skin: Negative.   Neurological: Negative.   Hematological: Negative.   Psychiatric/Behavioral: Negative.       Objective:   Physical Exam  Constitutional: She is oriented to person, place, and time. She appears well-developed and well-nourished.  HENT:  Head: Normocephalic and atraumatic.  Eyes: EOM are normal.  Neck: Normal range of motion.  Cardiovascular: Normal rate and regular rhythm.  Pulmonary/Chest: Effort normal and breath sounds normal. No respiratory distress. She has no wheezes. She has no rales.  Abdominal: Soft. Bowel sounds are normal. She exhibits no distension. There is no tenderness. There is no rebound.  Musculoskeletal: She exhibits tenderness. She exhibits no edema.  Neurological: She is alert  and oriented to person, place, and time. Coordination normal.  Skin: Skin is warm and dry.  Psychiatric: She has a normal mood and affect.   Vitals:   11/12/17 1048  BP: 100/60  Pulse: (!) 54  Temp: 98.1 F (36.7 C)  TempSrc: Oral  SpO2: 98%  Weight: 185 lb (83.9 kg)  Height: 5\' 5"  (1.651 m)      Assessment & Plan:

## 2017-11-13 ENCOUNTER — Encounter: Payer: Self-pay | Admitting: Internal Medicine

## 2017-11-13 ENCOUNTER — Other Ambulatory Visit: Payer: Self-pay | Admitting: Emergency Medicine

## 2017-11-13 MED ORDER — METOPROLOL SUCCINATE ER 25 MG PO TB24: 25.0000 mg | ORAL_TABLET | Freq: Every day | ORAL | 1 refills | Status: DC

## 2017-11-13 NOTE — Assessment & Plan Note (Signed)
Recent labs normal, taking synthroid 100 mcg daily. Refill as needed.

## 2017-11-13 NOTE — Assessment & Plan Note (Signed)
She does need ongoing follow up with this as she is still struggling some with cravings although getting further apart. She is mostly off chantix but using for cravings which is okay. We talked about how it may not work effectively for this but it is currently helping her. Will continue to monitor. Reinforced the need for complete lifelong cessation.

## 2017-11-13 NOTE — Telephone Encounter (Signed)
[facility]ill of Metoprolol succinate er 25mg  [facility] [place]

## 2017-11-13 NOTE — Progress Notes (Signed)
Abstracted and sent to scan  

## 2017-11-13 NOTE — Assessment & Plan Note (Signed)
Some flare today. Rx for small amount tramadol and flexeril. No imaging required today. If not resolving can consider x-ray to check for progression of her arthritis.

## 2017-11-19 ENCOUNTER — Other Ambulatory Visit: Payer: Self-pay | Admitting: Emergency Medicine

## 2017-11-19 MED ORDER — ATORVASTATIN CALCIUM 80 MG PO TABS: 80.0000 mg | ORAL_TABLET | Freq: Every day | ORAL | 2 refills | Status: DC

## 2017-11-19 NOTE — Telephone Encounter (Signed)
[facility] [place]

## 2017-12-03 ENCOUNTER — Telehealth: Payer: Self-pay | Admitting: Cardiology

## 2017-12-03 MED ORDER — CLOPIDOGREL BISULFATE 75 MG PO TABS: 75.0000 mg | ORAL_TABLET | Freq: Every day | ORAL | 2 refills | Status: DC

## 2017-12-03 NOTE — Telephone Encounter (Signed)
Pt calling requesting a refill of Plavix be [facility] to [facility] [address]  Please address

## 2017-12-03 NOTE — Telephone Encounter (Signed)
[facility] [place].

## 2018-01-22 ENCOUNTER — Other Ambulatory Visit: Payer: Self-pay | Admitting: Internal Medicine

## 2018-05-06 ENCOUNTER — Ambulatory Visit: Payer: BLUE CROSS/BLUE SHIELD | Admitting: Cardiology

## 2018-05-06 ENCOUNTER — Other Ambulatory Visit (INDEPENDENT_AMBULATORY_CARE_PROVIDER_SITE_OTHER): Payer: BLUE CROSS/BLUE SHIELD

## 2018-05-06 ENCOUNTER — Ambulatory Visit (INDEPENDENT_AMBULATORY_CARE_PROVIDER_SITE_OTHER): Payer: BLUE CROSS/BLUE SHIELD | Admitting: Internal Medicine

## 2018-05-06 ENCOUNTER — Encounter: Payer: Self-pay | Admitting: Internal Medicine

## 2018-05-06 VITALS — BP 130/90 | HR 64 | Temp 98.0°F | Ht 65.0 in | Wt 191.0 lb

## 2018-05-06 DIAGNOSIS — E785 Hyperlipidemia, unspecified: Secondary | ICD-10-CM

## 2018-05-06 DIAGNOSIS — Z0000 Encounter for general adult medical examination without abnormal findings: Secondary | ICD-10-CM

## 2018-05-06 DIAGNOSIS — E039 Hypothyroidism, unspecified: Secondary | ICD-10-CM | POA: Diagnosis not present

## 2018-05-06 DIAGNOSIS — I10 Essential (primary) hypertension: Secondary | ICD-10-CM

## 2018-05-06 DIAGNOSIS — Z87891 Personal history of nicotine dependence: Secondary | ICD-10-CM

## 2018-05-06 LAB — COMPREHENSIVE METABOLIC PANEL
ALT: 21 U/L (ref 0–35)
AST: 21 U/L (ref 0–37)
Albumin: 3.9 g/dL (ref 3.5–5.2)
Alkaline Phosphatase: 77 U/L (ref 39–117)
BUN: 7 mg/dL (ref 6–23)
CO2: 31 mEq/L (ref 19–32)
Calcium: 9.2 mg/dL (ref 8.4–10.5)
Chloride: 104 mEq/L (ref 96–112)
Creatinine, Ser: 0.71 mg/dL (ref 0.40–1.20)
GFR: 107.18 mL/min (ref >60.00)
Glucose, Bld: 91 mg/dL (ref 70–99)
Potassium: 4.1 mEq/L (ref 3.5–5.1)
Sodium: 141 mEq/L (ref 135–145)
Total Bilirubin: 0.4 mg/dL (ref 0.2–1.2)
Total Protein: 6.9 g/dL (ref 6.0–8.3)

## 2018-05-06 LAB — CBC
HCT: 43.9 % (ref 36.0–46.0)
Hemoglobin: 14.4 g/dL (ref 12.0–15.0)
MCHC: 32.7 g/dL (ref 30.0–36.0)
MCV: 91.1 fl (ref 78.0–100.0)
Platelets: 336 10*3/uL (ref 150.0–400.0)
RBC: 4.82 Mil/uL (ref 3.87–5.11)
RDW: 14.2 % (ref 11.5–15.5)
WBC: 9.2 10*3/uL (ref 4.0–10.5)

## 2018-05-06 LAB — LIPID PANEL
Cholesterol: 128 mg/dL (ref 0–200)
HDL: 38.1 mg/dL — ABNORMAL LOW (ref >39.00)
LDL Cholesterol: 69 mg/dL (ref 0–99)
NonHDL: 89.8
Total CHOL/HDL Ratio: 3
Triglycerides: 106 mg/dL (ref 0.0–149.0)
VLDL: 21.2 mg/dL (ref 0.0–40.0)

## 2018-05-06 LAB — HEMOGLOBIN A1C: Hgb A1c MFr Bld: 6.2 % (ref 4.6–6.5)

## 2018-05-06 MED ORDER — PANTOPRAZOLE SODIUM 40 MG PO TBEC: 40.0000 mg | DELAYED_RELEASE_TABLET | Freq: Every day | ORAL | 3 refills | Status: DC

## 2018-05-06 NOTE — Patient Instructions (Signed)

## 2018-05-06 NOTE — Assessment & Plan Note (Signed)
Still smoke free and more than 1 year. Encouraged life long cessation!

## 2018-05-06 NOTE — Assessment & Plan Note (Signed)
Taking synthroid 100 mcg daily and continue.

## 2018-05-06 NOTE — Assessment & Plan Note (Signed)
Flu shot declines. Shingrix declines. Tetanus up to date. Colonoscopy declines. Mammogram up to date, pap smear up to date and dexa up to date. Counseled about sun safety and mole surveillance. Counseled about the dangers of distracted driving. Given 10 year screening recommendations.

## 2018-05-06 NOTE — Assessment & Plan Note (Signed)
Checking lipid panel and adjust lipitor as needed.  

## 2018-05-06 NOTE — Progress Notes (Signed)
   Subjective:   Patient ID: Deborah Stewart, female    DOB: May 04, 1955, 63 y.o.   MRN: [id number]  HPI The patient is a 63 YO female coming in for physical. Recent loss of mom in November and dad within last 1-2 weeks.  PMH, [facility], social history reviewed and updated  Review of Systems  Constitutional: Negative.   HENT: Negative.   Eyes: Negative.   Respiratory: Negative for cough, chest tightness and shortness of breath.   Cardiovascular: Negative for chest pain, palpitations and leg swelling.  Gastrointestinal: Negative for abdominal distention, abdominal pain, constipation, diarrhea, nausea and vomiting.  Musculoskeletal: Negative.   Skin: Negative.   Neurological: Negative.   Psychiatric/Behavioral: Negative.     Objective:  Physical Exam Constitutional:      Appearance: She is well-developed.  HENT:     Head: Normocephalic and atraumatic.  Neck:     Musculoskeletal: Normal range of motion.  Cardiovascular:     Rate and Rhythm: Normal rate and regular rhythm.  Pulmonary:     Effort: Pulmonary effort is normal. No respiratory distress.     Breath sounds: Normal breath sounds. No wheezing or rales.  Abdominal:     General: Bowel sounds are normal. There is no distension.     Palpations: Abdomen is soft.     Tenderness: There is no abdominal tenderness. There is no rebound.  Skin:    General: Skin is warm and dry.  Neurological:     Mental Status: She is alert and oriented to person, place, and time.     Coordination: Coordination normal.    Vitals:   05/06/18 1022  BP: 130/90  Pulse: 64  Temp: 98 F (36.7 C)  TempSrc: Oral  SpO2: 99%  Weight: 191 lb (86.6 kg)  Height: 5\' 5"  (1.651 m)    Assessment & Plan:

## 2018-05-06 NOTE — Assessment & Plan Note (Signed)
BP at goal on her metoprolol. Checking CMP and adjust as needed.

## 2018-05-17 ENCOUNTER — Other Ambulatory Visit: Payer: Self-pay

## 2018-05-17 ENCOUNTER — Encounter: Payer: Self-pay | Admitting: Cardiology

## 2018-05-17 ENCOUNTER — Ambulatory Visit: Payer: BLUE CROSS/BLUE SHIELD | Admitting: Cardiology

## 2018-05-17 VITALS — BP 126/72 | HR 55 | Ht 65.0 in | Wt 191.0 lb

## 2018-05-17 DIAGNOSIS — I10 Essential (primary) hypertension: Secondary | ICD-10-CM | POA: Diagnosis not present

## 2018-05-17 DIAGNOSIS — I2510 Atherosclerotic heart disease of native coronary artery without angina pectoris: Secondary | ICD-10-CM | POA: Diagnosis not present

## 2018-05-17 DIAGNOSIS — E785 Hyperlipidemia, unspecified: Secondary | ICD-10-CM | POA: Diagnosis not present

## 2018-05-17 DIAGNOSIS — Z87891 Personal history of nicotine dependence: Secondary | ICD-10-CM

## 2018-05-17 MED ORDER — ATORVASTATIN CALCIUM 80 MG PO TABS: 80.0000 mg | ORAL_TABLET | Freq: Every day | ORAL | 2 refills | Status: DC

## 2018-05-17 MED ORDER — METOPROLOL SUCCINATE ER 25 MG PO TB24: 25.0000 mg | ORAL_TABLET | Freq: Every day | ORAL | 1 refills | Status: DC

## 2018-05-17 MED ORDER — CLOPIDOGREL BISULFATE 75 MG PO TABS: 75.0000 mg | ORAL_TABLET | Freq: Every day | ORAL | 2 refills | Status: DC

## 2018-05-17 NOTE — Patient Instructions (Signed)

## 2018-05-17 NOTE — Progress Notes (Signed)
Cardiology Office Note:    Date:  05/17/2018   ID:  Deborah Stewart, DOB 23-Sep-1955, MRN [id number]  PCP:  Hoyt Koch, MD  Cardiologist:  Jenean Lindau, MD   Referring MD: Hoyt Koch, *    ASSESSMENT:    1. Coronary artery disease involving native coronary artery of native heart without angina pectoris   2. Essential hypertension   3. Dyslipidemia   4. Former smoker    PLAN:    In order of problems listed above:  1. Secondary prevention stressed with the patient.  Importance of compliance with diet and medication stressed and she vocalized understanding.  Her blood pressure is stable.  Diet was discussed for dyslipidemia and lipids were reviewed from recent evaluation and they were fine 2. Exercise was discussed.  Importance of weight reduction was stressed 3. Patient will be seen in follow-up appointment in 6 months or earlier if the patient has any concerns    Medication Adjustments/Labs and Tests Ordered: Current medicines are reviewed at length with the patient today.  Concerns regarding medicines are outlined above.  No orders of the defined types were placed in this encounter.  No orders of the defined types were placed in this encounter.    No chief complaint on file.    History of Present Illness:    Deborah Stewart is a 63 y.o. female.  Patient has past medical history of coronary artery disease.  She denies any problems at this time and takes care of activities of daily living.  No chest pain orthopnea or PND.  She leads a sedentary lifestyle but has started exercising.  She was busy with her parents and both of them passed away in the past 6 months.  She is now telling me that she is over the grieving process.  At the time of my evaluation, the patient is alert awake oriented and in no distress.  Past Medical History:  Diagnosis Date  . Abnormal Pap smear   . Anxiety   . Arthritis    "knees" (02/06/2017)  . ASCUS on Pap smear    . Breast mass    "I have lumpy breasts" (02/06/2017)  . Coronary artery disease    10/18 PCI/DESx1 to mRCA.   . DDD (degenerative disc disease), cervical    s/p diskectomy   . DDD (degenerative disc disease), lumbar    by MRI  . Diverticulosis of colon (without mention of hemorrhage)   . Dysmenorrhea   . Dyspareunia   . Dysplasia of cervix, low grade (CIN 1)   . Endometriosis   . History of chicken pox   . History of measles   . Hyperlipidemia   . Hypertension   . Pelvic pain in female   . Personal history of colonic polyps 06/2010   hyperplastic, tubular adenoma  . Thyroid nodule   . Ulcerative colitis ([place])   . Unspecified hypothyroidism     Past Surgical History:  Procedure Laterality Date  . ANTERIOR CERVICAL DECOMP/DISCECTOMY FUSION  2004   Dr. Trenton Gammon  . BACK SURGERY    . BREAST BIOPSY Bilateral   . CORONARY ANGIOPLASTY WITH STENT PLACEMENT  02/06/2017  . CORONARY STENT INTERVENTION N/A 02/06/2017   Procedure: CORONARY STENT INTERVENTION;  Surgeon: Belva Crome, MD;  Location: [place];  Service: Cardiovascular;  Laterality: N/A;  . FOOT SURGERY Right 09/2011    Dr. Ladora Daniel, DPO, removal of "mass"   . FOOT SURGERY Left 1970s X  1  . FOOT SURGERY Right 2000s "multiple"   "botched 1st time; tried to correct several times"  . LEFT HEART CATH AND CORONARY ANGIOGRAPHY N/A 02/06/2017   Procedure: LEFT HEART CATH AND CORONARY ANGIOGRAPHY;  Surgeon: Belva Crome, MD;  Location: [facility];  Service: Cardiovascular;  Laterality: N/A;  . THYROIDECTOMY, PARTIAL  06/2009   "removed goiter"  . TOTAL ABDOMINAL HYSTERECTOMY     TOTAL ABDOMINAL HYSTERECTOMY W/ BILATERAL SALPINGOOPHORECTOMY     Current Medications: Current Meds  Medication Sig  . atorvastatin (LIPITOR) 80 MG tablet Take 1 tablet (80 mg total) by mouth daily at 6 PM.  . clopidogrel (PLAVIX) 75 MG tablet Take 1 tablet (75 mg total) by mouth daily with breakfast.  . cyclobenzaprine  (FLEXERIL) 10 MG tablet Take 1 tablet (10 mg total) by mouth every 8 (eight) hours as needed.  . hyoscyamine (LEVSIN SL) 0.125 MG SL tablet Take 1 tablet (0.125 mg total) by mouth every 6 (six) hours as needed for cramping.  Marland Kitchen levothyroxine (SYNTHROID, LEVOTHROID) 100 MCG tablet TAKE 1 TABLET DAILY BEFORE BREAKFAST  . mesalamine (LIALDA) 1.2 g EC tablet TAKE 2 TABLETS (2.4 G TOTAL) BY MOUTH DAILY WITH BREAKFAST. (Patient taking differently: Take 1.2 g by mouth as needed. TAKE 2 TABLETS (2.4 G TOTAL) BY MOUTH DAILY WITH BREAKFAST.)  . metoprolol succinate (TOPROL-XL) 25 MG 24 hr tablet Take 1 tablet (25 mg total) by mouth daily.  . pantoprazole (PROTONIX) 40 MG tablet Take 1 tablet (40 mg total) by mouth daily. (Patient taking differently: Take 40 mg by mouth as needed. )  . traMADol (ULTRAM) 50 MG tablet Take 1 tablet (50 mg total) by mouth daily. (Patient taking differently: Take 50 mg by mouth every 6 (six) hours as needed. )  . varenicline (CHANTIX) 1 MG tablet Take 1 tablet (1 mg total) by mouth 2 (two) times daily. (Patient taking differently: Take 1 mg by mouth as needed. )     Allergies:   Codeine   Social History   Socioeconomic History  . Marital status: Divorced    Spouse name: Not on file  . Number of children: 1  . Years of education: 60  . Highest education level: Not on file  Occupational History  . Occupation: filed Nurse, mental health: [employer]  Social Needs  . Financial resource strain: Not on file  . Food insecurity:    Worry: Not on file    Inability: Not on file  . Transportation needs:    Medical: Not on file    Non-medical: Not on file  Tobacco Use  . Smoking status: Former Smoker    Packs/day: 0.50    Years: 45.00    Pack years: 22.50    Types: Cigarettes    Last attempt to quit: 02/06/2017    Years since quitting: 1.2  . Smokeless tobacco: Never Used  . Tobacco comment: 3 cigs a week---taking Chantix  Substance and Sexual Activity  . Alcohol use:  Yes    Alcohol/week: 5.0 standard drinks    Types: 5 Standard drinks or equivalent per week    Comment: 02/06/2017 "might have 1-4 mixed drinks/month"  . Drug use: No  . Sexual activity: Not on file  Lifestyle  . Physical activity:    Days per week: Not on file    Minutes per session: Not on file  . Stress: Not on file  Relationships  . Social connections:    Talks on phone: Not  on file    Gets together: Not on file    Attends religious service: Not on file    Active member of club or organization: Not on file    Attends meetings of clubs or organizations: Not on file    Relationship status: Not on file  Other Topics Concern  . Not on file  Social History Narrative   HSG, [employer] -BS social work. Married 1980 - 26 yrs/divorced. 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 Dating - long term relationship/[place]. Unprotected. No history of physical or sexual abuse.      Family History: The patient's family history includes Breast cancer in her sister and another family member; Diabetes in her brother, father, mother, and another family member; Heart disease in her brother, father, and another family member; Hyperlipidemia in her brother; Hypertension in her brother and brother; Lung cancer in an other family member. There is no history of Colon cancer.  ROS:   Please see the history of present illness.    All other systems reviewed and are negative.  EKGs/Labs/Other Studies Reviewed:    The following studies were reviewed today: I discussed my findings with the patient at extensive length.   Recent Labs: 05/06/2018: ALT 21; BUN 7; Creatinine, Ser 0.71; Hemoglobin 14.4; Platelets 336.0; Potassium 4.1; Sodium 141  Recent Lipid Panel    Component Value Date/Time   CHOL 128 05/06/2018 1105   CHOL 117 05/09/2017 1235   TRIG 106.0 05/06/2018 1105   HDL 38.10 (L) 05/06/2018 1105   HDL 37 (L) 05/09/2017 1235   CHOLHDL 3 05/06/2018 1105   VLDL 21.2  05/06/2018 1105   LDLCALC 69 05/06/2018 1105   LDLCALC 65 05/09/2017 1235   LDLDIRECT 112.1 10/11/2012 1554    Physical Exam:    VS:  BP 126/72 (BP Location: Right Arm, Patient Position: Sitting, Cuff Size: Normal)   Pulse (!) 55   Ht 5\' 5"  (1.651 m)   Wt 191 lb (86.6 kg)   SpO2 99%   BMI 31.78 kg/m     Wt Readings from Last 3 Encounters:  05/17/18 191 lb (86.6 kg)  05/06/18 191 lb (86.6 kg)  11/12/17 185 lb (83.9 kg)     GEN: Patient is in no acute distress HEENT: Normal NECK: No JVD; No carotid bruits LYMPHATICS: No lymphadenopathy CARDIAC: Hear sounds regular, 2/6 systolic murmur at the apex. RESPIRATORY:  Clear to auscultation without rales, wheezing or rhonchi  ABDOMEN: Soft, non-tender, non-distended MUSCULOSKELETAL:  No edema; No deformity  SKIN: Warm and dry NEUROLOGIC:  Alert and oriented x 3 PSYCHIATRIC:  Normal affect   Signed, Jenean Lindau, MD  05/17/2018 9:43 AM    [place]

## 2018-07-22 ENCOUNTER — Telehealth: Payer: Self-pay | Admitting: Cardiology

## 2018-07-22 NOTE — Telephone Encounter (Signed)
°*  STAT* If patient is at the pharmacy, call can be transferred to refill team.   1. Which medications need to be refilled? (please list name of each medication and dose if known) Chantix  2. Which pharmacy/location (including street and city if local pharmacy) [facility] [address]  3. Do they need a 30 day or 90 day supply?

## 2018-07-23 NOTE — Telephone Encounter (Signed)
Ok to start smallest dose of chantix

## 2018-07-24 NOTE — Telephone Encounter (Signed)
RN was unclear whether patient has currently taking chantix or just wants a new script? VM left for patient to return call. Dr.RRR would like patient to take 1 mg BID if this is her first effort. If this is a refill patient to be given 2 mg BID for 30 days only.

## 2018-07-25 ENCOUNTER — Other Ambulatory Visit: Payer: Self-pay | Admitting: Cardiology

## 2018-07-25 DIAGNOSIS — I2510 Atherosclerotic heart disease of native coronary artery without angina pectoris: Secondary | ICD-10-CM

## 2018-08-08 ENCOUNTER — Telehealth: Payer: Self-pay

## 2018-08-08 NOTE — Telephone Encounter (Signed)
Left another message for patient to call back and clarify her medication need.

## 2018-08-19 ENCOUNTER — Other Ambulatory Visit: Payer: Self-pay | Admitting: Internal Medicine

## 2018-11-04 ENCOUNTER — Ambulatory Visit (INDEPENDENT_AMBULATORY_CARE_PROVIDER_SITE_OTHER): Payer: BLUE CROSS/BLUE SHIELD | Admitting: Internal Medicine

## 2018-11-04 ENCOUNTER — Other Ambulatory Visit: Payer: Self-pay

## 2018-11-04 ENCOUNTER — Encounter: Payer: Self-pay | Admitting: Internal Medicine

## 2018-11-04 ENCOUNTER — Other Ambulatory Visit (INDEPENDENT_AMBULATORY_CARE_PROVIDER_SITE_OTHER): Payer: BLUE CROSS/BLUE SHIELD

## 2018-11-04 VITALS — BP 122/80 | HR 68 | Temp 98.6°F | Ht 65.0 in | Wt 188.0 lb

## 2018-11-04 DIAGNOSIS — R7303 Prediabetes: Secondary | ICD-10-CM

## 2018-11-04 DIAGNOSIS — E039 Hypothyroidism, unspecified: Secondary | ICD-10-CM

## 2018-11-04 DIAGNOSIS — R0789 Other chest pain: Secondary | ICD-10-CM | POA: Diagnosis not present

## 2018-11-04 HISTORY — DX: Prediabetes: R73.03

## 2018-11-04 LAB — COMPREHENSIVE METABOLIC PANEL
ALT: 17 U/L (ref 0–35)
AST: 20 U/L (ref 0–37)
Albumin: 4.1 g/dL (ref 3.5–5.2)
Alkaline Phosphatase: 77 U/L (ref 39–117)
BUN: 13 mg/dL (ref 6–23)
CO2: 28 mEq/L (ref 19–32)
Calcium: 8.8 mg/dL (ref 8.4–10.5)
Chloride: 106 mEq/L (ref 96–112)
Creatinine, Ser: 0.77 mg/dL (ref 0.40–1.20)
GFR: 91.68 mL/min (ref >60.00)
Glucose, Bld: 107 mg/dL — ABNORMAL HIGH (ref 70–99)
Potassium: 3 mEq/L — ABNORMAL LOW (ref 3.5–5.1)
Sodium: 143 mEq/L (ref 135–145)
Total Bilirubin: 0.4 mg/dL (ref 0.2–1.2)
Total Protein: 7.1 g/dL (ref 6.0–8.3)

## 2018-11-04 LAB — HEMOGLOBIN A1C: Hgb A1c MFr Bld: 6.4 % (ref 4.6–6.5)

## 2018-11-04 LAB — TSH: TSH: 1.51 u[IU]/mL (ref 0.35–4.50)

## 2018-11-04 LAB — MAGNESIUM: Magnesium: 1.8 mg/dL (ref 1.5–2.5)

## 2018-11-04 NOTE — Assessment & Plan Note (Signed)
Could be related to GERD given history and will have her double protonix for 2 weeks to see if this helps and move food to earlier in the evening. Will message her cardiologist to see if she can get stress testing.

## 2018-11-04 NOTE — Patient Instructions (Addendum)
We will check the labs today and call you back about the results.    

## 2018-11-04 NOTE — Assessment & Plan Note (Signed)
Checking TSH and adjust synthroid as needed.  

## 2018-11-04 NOTE — Progress Notes (Signed)
   Subjective:   Patient ID: Deborah Stewart, female    DOB: [birth year], 63 y.o.   MRN: [id number]  HPI The patient is a 63 YO female coming in for follow up of pre-diabetes (has been adding exercise to her life, coping okay with the pandemic, some dietary changes as well) and thyroid (taking synthroid daily, denies heat or cold intolerance, denies weight changes up or down unintentional) and weight (working on diet and exercise and down 3 pounds per our scales, she states more like 6 at home) and new chest discomfort (started recently with some chest pressure and discomfort in the evening, she is taking protonix daily, eating within 2 hours of night time, she is concerned about her heart, last stent 2018, EKG Jan 2020 without changes, denies this discomfort with activity although she is not doing as much, still non-smoking).   Review of Systems  Constitutional: Negative.   HENT: Negative.   Eyes: Negative.   Respiratory: Positive for chest tightness. Negative for cough and shortness of breath.   Cardiovascular: Positive for chest pain. Negative for palpitations and leg swelling.  Gastrointestinal: Negative for abdominal distention, abdominal pain, anal bleeding, blood in stool, constipation, diarrhea, nausea, rectal pain and vomiting.  Musculoskeletal: Negative.   Skin: Negative.   Neurological: Negative.   Psychiatric/Behavioral: Negative.     Objective:  Physical Exam Constitutional:      Appearance: She is well-developed.  HENT:     Head: Normocephalic and atraumatic.  Neck:     Musculoskeletal: Normal range of motion.  Cardiovascular:     Rate and Rhythm: Normal rate and regular rhythm.  Pulmonary:     Effort: Pulmonary effort is normal. No respiratory distress.     Breath sounds: Normal breath sounds. No wheezing or rales.  Abdominal:     General: Bowel sounds are normal. There is no distension.     Palpations: Abdomen is soft.     Tenderness: There is no abdominal  tenderness. There is no rebound.  Skin:    General: Skin is warm and dry.  Neurological:     Mental Status: She is alert and oriented to person, place, and time.     Coordination: Coordination normal.     Vitals:   11/04/18 1014  BP: 122/80  Pulse: 68  Temp: 98.6 F (37 C)  TempSrc: Oral  SpO2: 99%  Weight: 188 lb (85.3 kg)  Height: 5\' 5"  (1.651 m)    Assessment & Plan:

## 2018-11-04 NOTE — Assessment & Plan Note (Signed)
Checking HgA1c and adjust as needed. Family history of diabetes. Weight is down some from last visit.

## 2018-11-05 ENCOUNTER — Other Ambulatory Visit: Payer: Self-pay | Admitting: Internal Medicine

## 2018-11-05 MED ORDER — POTASSIUM CHLORIDE CRYS ER 20 MEQ PO TBCR: 20.0000 meq | EXTENDED_RELEASE_TABLET | Freq: Two times a day (BID) | ORAL | 0 refills | Status: DC

## 2018-11-06 NOTE — Telephone Encounter (Signed)
mychart message sent and left vm for patient to call back to schedule appt with Dr. Docia Furl.

## 2018-11-13 ENCOUNTER — Other Ambulatory Visit: Payer: Self-pay

## 2018-11-13 ENCOUNTER — Encounter: Payer: Self-pay | Admitting: Cardiology

## 2018-11-13 ENCOUNTER — Ambulatory Visit (INDEPENDENT_AMBULATORY_CARE_PROVIDER_SITE_OTHER): Payer: BLUE CROSS/BLUE SHIELD | Admitting: Cardiology

## 2018-11-13 VITALS — BP 112/62 | HR 67 | Ht 65.0 in | Wt 188.0 lb

## 2018-11-13 DIAGNOSIS — Z87891 Personal history of nicotine dependence: Secondary | ICD-10-CM | POA: Diagnosis not present

## 2018-11-13 DIAGNOSIS — I10 Essential (primary) hypertension: Secondary | ICD-10-CM | POA: Diagnosis not present

## 2018-11-13 DIAGNOSIS — I2510 Atherosclerotic heart disease of native coronary artery without angina pectoris: Secondary | ICD-10-CM

## 2018-11-13 DIAGNOSIS — E785 Hyperlipidemia, unspecified: Secondary | ICD-10-CM | POA: Diagnosis not present

## 2018-11-13 NOTE — Patient Instructions (Signed)
Medication Instructions:  Your physician recommends that you continue on your current medications as directed. Please refer to the Current Medication list given to you today.  If you need a refill on your cardiac medications before your next appointment, please call your pharmacy.   Lab work: Your physician recommends that you have BMP drawn today.   If you have labs (blood work) drawn today and your tests are completely normal, you will receive your results only by: Marland Kitchen MyChart Message (if you have MyChart) OR . A paper copy in the mail If you have any lab test that is abnormal or we need to change your treatment, we will call you to review the results.  Testing/Procedures: YOU had an EKG performed today.  Follow-Up: At [facility], you and your health needs are our priority.  As part of our continuing mission to provide you with exceptional heart care, we have created designated Provider Care Teams.  These Care Teams include your primary Cardiologist (physician) and Advanced Practice Providers (APPs -  Physician Assistants and Nurse Practitioners) who all work together to provide you with the care you need, when you need it. You will need a follow up appointment in 4 months.

## 2018-11-13 NOTE — Progress Notes (Signed)
Cardiology Office Note:    Date:  11/13/2018   ID:  Deborah Stewart, DOB 1955-12-04, MRN [id number]  PCP:  Hoyt Koch, MD  Cardiologist:  Jenean Lindau, MD   Referring MD: Hoyt Koch, *    ASSESSMENT:    1. Essential hypertension   2. Coronary artery disease involving native coronary artery of native heart without angina pectoris   3. Former smoker   4. Dyslipidemia    PLAN:    In order of problems listed above:  1. Coronary artery disease: Secondary prevention stressed with the patient.  Importance of compliance with diet and medication stressed.  I congratulated her for her excellent exercise size and effort tolerance.  She is also lost weight in the past 2 months with dieting and asked her to continue that trend. 2. Essential hypertension: Blood pressure stable 3. Mixed dyslipidemia: Diet was discussed and she vocalized understanding. 4. Hypokalemia: Potassium was low and she was given supplementation by her primary care physician.  We will repeat Chem-7 today and her primary care will manage these issues after getting the lab reports.  This she is aware. 5. Patient will be seen in follow-up appointment in 4 months or earlier if the patient has any concerns    Medication Adjustments/Labs and Tests Ordered: Current medicines are reviewed at length with the patient today.  Concerns regarding medicines are outlined above.  No orders of the defined types were placed in this encounter.  No orders of the defined types were placed in this encounter.    No chief complaint on file.    History of Present Illness:    Deborah Stewart is a 63 y.o. female.  Patient has past medical history of coronary artery disease, essential hypertension and dyslipidemia.  She denies any problems at this time and takes care of activities of daily living.  No chest pain orthopnea or PND.  She had an episode of chest tightness at rest 3 weeks ago and she tells me that  it scared her.  Subsequently she started walking.  Now she walks 45 minutes a day on a regular basis without any problems at the time of my evaluation, the patient is alert awake oriented and in no distress.  She did not use nitroglycerin for her episode.  Past Medical History:  Diagnosis Date  . Abnormal Pap smear   . Anxiety   . Arthritis    "knees" (02/06/2017)  . ASCUS on Pap smear   . Breast mass    "I have lumpy breasts" (02/06/2017)  . Coronary artery disease    10/18 PCI/DESx1 to mRCA.   . DDD (degenerative disc disease), cervical    s/p diskectomy   . DDD (degenerative disc disease), lumbar    by MRI  . Diverticulosis of colon (without mention of hemorrhage)   . Dysmenorrhea   . Dyspareunia   . Dysplasia of cervix, low grade (CIN 1)   . Endometriosis   . History of chicken pox   . History of measles   . Hyperlipidemia   . Hypertension   . Pelvic pain in female   . Personal history of colonic polyps 06/2010   hyperplastic, tubular adenoma  . Thyroid nodule   . Ulcerative colitis ([place])   . Unspecified hypothyroidism     Past Surgical History:  Procedure Laterality Date  . ANTERIOR CERVICAL DECOMP/DISCECTOMY FUSION  2004   Dr. Trenton Gammon  . BACK SURGERY    . BREAST BIOPSY Bilateral   .  CORONARY ANGIOPLASTY WITH STENT PLACEMENT  02/06/2017  . CORONARY STENT INTERVENTION N/A 02/06/2017   Procedure: CORONARY STENT INTERVENTION;  Surgeon: Belva Crome, MD;  Location: [place] [facility];  Service: Cardiovascular;  Laterality: N/A;  . FOOT SURGERY Right 09/2011    Dr. Ladora Daniel, DPO, removal of "mass"   . FOOT SURGERY Left 1970s X 1  . FOOT SURGERY Right 2000s "multiple"   "botched 1st time; tried to correct several times"  . LEFT HEART CATH AND CORONARY ANGIOGRAPHY N/A 02/06/2017   Procedure: LEFT HEART CATH AND CORONARY ANGIOGRAPHY;  Surgeon: Belva Crome, MD;  Location: [place] [facility];  Service: Cardiovascular;  Laterality: N/A;  . THYROIDECTOMY, PARTIAL   06/2009   "removed goiter"  . TOTAL ABDOMINAL HYSTERECTOMY     TOTAL ABDOMINAL HYSTERECTOMY W/ BILATERAL SALPINGOOPHORECTOMY     Current Medications: Current Meds  Medication Sig  . atorvastatin (LIPITOR) 80 MG tablet Take 1 tablet (80 mg total) by mouth daily at 6 PM.  . clopidogrel (PLAVIX) 75 MG tablet Take 1 tablet (75 mg total) by mouth daily with breakfast.  . cyclobenzaprine (FLEXERIL) 10 MG tablet Take 1 tablet (10 mg total) by mouth every 8 (eight) hours as needed.  . hyoscyamine (LEVSIN SL) 0.125 MG SL tablet Take 1 tablet (0.125 mg total) by mouth every 6 (six) hours as needed for cramping.  Marland Kitchen levothyroxine (SYNTHROID) 100 MCG tablet TAKE 1 TABLET DAILY BEFORE BREAKFAST  . mesalamine (LIALDA) 1.2 g EC tablet TAKE 2 TABLETS (2.4 G TOTAL) BY MOUTH DAILY WITH BREAKFAST. (Patient taking differently: Take 1.2 g by mouth as needed. TAKE 2 TABLETS (2.4 G TOTAL) BY MOUTH DAILY WITH BREAKFAST.)  . metoprolol succinate (TOPROL-XL) 25 MG 24 hr tablet Take 1 tablet (25 mg total) by mouth daily.  . pantoprazole (PROTONIX) 40 MG tablet Take 1 tablet (40 mg total) by mouth daily. (Patient taking differently: Take 40 mg by mouth as needed. )  . potassium chloride SA (K-DUR) 20 MEQ tablet Take 1 tablet (20 mEq total) by mouth 2 (two) times daily.  . traMADol (ULTRAM) 50 MG tablet Take 1 tablet (50 mg total) by mouth daily. (Patient taking differently: Take 50 mg by mouth every 12 (twelve) hours as needed. )     Allergies:   Codeine   Social History   Socioeconomic History  . Marital status: Divorced    Spouse name: Not on file  . Number of children: 1  . Years of education: 14  . Highest education level: Not on file  Occupational History  . Occupation: filed Nurse, mental health: [employer]  Social Needs  . Financial resource strain: Not on file  . Food insecurity    Worry: Not on file    Inability: Not on file  . Transportation needs    Medical: Not on file    Non-medical: Not on  file  Tobacco Use  . Smoking status: Former Smoker    Packs/day: 0.50    Years: 45.00    Pack years: 22.50    Types: Cigarettes    Quit date: 02/06/2017    Years since quitting: 1.7  . Smokeless tobacco: Never Used  . Tobacco comment: 3 cigs a week---taking Chantix  Substance and Sexual Activity  . Alcohol use: Yes    Alcohol/week: 5.0 standard drinks    Types: 5 Standard drinks or equivalent per week    Comment: 02/06/2017 "might have 1-4 mixed drinks/month"  . Drug use: No  .  Sexual activity: Not on file  Lifestyle  . Physical activity    Days per week: Not on file    Minutes per session: Not on file  . Stress: Not on file  Relationships  . Social Herbalist on phone: Not on file    Gets together: Not on file    Attends religious service: Not on file    Active member of club or organization: Not on file    Attends meetings of clubs or organizations: Not on file    Relationship status: Not on file  Other Topics Concern  . Not on file  Social History Narrative   [employer], [employer] -BS social work. Married 1980 - 38 yrs/divorced. 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 Dating - long term relationship/[place]. Unprotected. No history of physical or sexual abuse.      Family History: The patient's family history includes Breast cancer in her sister and another family member; Diabetes in her brother, father, mother, and another family member; Heart disease in her brother, father, and another family member; Hyperlipidemia in her brother; Hypertension in her brother and brother; Lung cancer in an other family member. There is no history of Colon cancer.  ROS:   Please see the history of present illness.    All other systems reviewed and are negative.  EKGs/Labs/Other Studies Reviewed:    The following studies were reviewed today: EKG was WNL  CORONARY STENT INTERVENTION  LEFT HEART CATH AND CORONARY ANGIOGRAPHY  Conclusion   Class III  angina pectoris occurring with minimal physical activity and early positive excise treadmill test.  Segmental 95+ percent stenosis in the mid right coronary. Proximal RCA contains 30% narrowing.  Luminal irregularities noted in the circumflex coronary artery, first diagonal, and 50% narrowing in the mid LAD.  Successful PCI with stent implantation reducing 95% stenosis to 0% with TIMI grade 3 flow using a Xience Sierra 3.0 x 23 mm postdilated to 4.0. TIMI grade 3 flow is noted.  RECOMMENDATIONS:   Aspirin and Plavix 6 months and optimally 12 months if possible.  Low-dose beta blocker therapy.  High intensity statin therapy.  Smoking cessation.   Phase II cardiac rehabilitation if possible.   **   Recent Labs: 05/06/2018: Hemoglobin 14.4; Platelets 336.0 11/04/2018: ALT 17; BUN 13; Creatinine, Ser 0.77; Magnesium 1.8; Potassium 3.0; Sodium 143; TSH 1.51  Recent Lipid Panel    Component Value Date/Time   CHOL 128 05/06/2018 1105   CHOL 117 05/09/2017 1235   TRIG 106.0 05/06/2018 1105   HDL 38.10 (L) 05/06/2018 1105   HDL 37 (L) 05/09/2017 1235   CHOLHDL 3 05/06/2018 1105   VLDL 21.2 05/06/2018 1105   LDLCALC 69 05/06/2018 1105   LDLCALC 65 05/09/2017 1235   LDLDIRECT 112.1 10/11/2012 1554    Physical Exam:    VS:  BP 112/62 (BP Location: Left Arm, Patient Position: Sitting, Cuff Size: Normal)   Pulse 67   Ht 5\' 5"  (1.651 m)   Wt 188 lb (85.3 kg)   SpO2 98%   BMI 31.28 kg/m     Wt Readings from Last 3 Encounters:  11/13/18 188 lb (85.3 kg)  11/04/18 188 lb (85.3 kg)  05/17/18 191 lb (86.6 kg)     GEN: Patient is in no acute distress HEENT: Normal NECK: No JVD; No carotid bruits LYMPHATICS: No lymphadenopathy CARDIAC: Hear sounds regular, 2/6 systolic murmur at the apex. RESPIRATORY:  Clear to auscultation without  rales, wheezing or rhonchi  ABDOMEN: Soft, non-tender, non-distended MUSCULOSKELETAL:  No edema; No deformity  SKIN: Warm and dry NEUROLOGIC:   Alert and oriented x 3 PSYCHIATRIC:  Normal affect   Signed, Jenean Lindau, MD  11/13/2018 1:43 PM    [facility]

## 2018-11-14 ENCOUNTER — Other Ambulatory Visit: Payer: Self-pay | Admitting: Cardiology

## 2018-11-14 ENCOUNTER — Telehealth: Payer: Self-pay

## 2018-11-14 LAB — BASIC METABOLIC PANEL
BUN/Creatinine Ratio: 14 (ref 12–28)
BUN: 10 mg/dL (ref 8–27)
CO2: 23 mmol/L (ref 20–29)
Calcium: 9.2 mg/dL (ref 8.7–10.3)
Chloride: 104 mmol/L (ref 96–106)
Creatinine, Ser: 0.69 mg/dL (ref 0.57–1.00)
GFR calc Af Amer: 108 mL/min/{1.73_m2} (ref >59)
GFR calc non Af Amer: 94 mL/min/{1.73_m2} (ref >59)
Glucose: 68 mg/dL (ref 65–99)
Potassium: 4.6 mmol/L (ref 3.5–5.2)
Sodium: 142 mmol/L (ref 134–144)

## 2018-11-14 NOTE — Telephone Encounter (Signed)
-----   Message from Jenean Lindau, MD sent at 11/14/2018  8:08 AM EDT ----- The results of the study is unremarkable. Please inform patient. I will discuss in detail at next appointment. Cc  primary care/referring physician Jenean Lindau, MD 11/14/2018 8:08 AM

## 2018-11-14 NOTE — Telephone Encounter (Signed)
Called and left detailed voice message on patients phone regarding lab results.

## 2018-11-18 ENCOUNTER — Encounter: Payer: Self-pay | Admitting: Internal Medicine

## 2018-11-18 ENCOUNTER — Ambulatory Visit (INDEPENDENT_AMBULATORY_CARE_PROVIDER_SITE_OTHER): Payer: BLUE CROSS/BLUE SHIELD | Admitting: Internal Medicine

## 2018-11-18 DIAGNOSIS — I2510 Atherosclerotic heart disease of native coronary artery without angina pectoris: Secondary | ICD-10-CM | POA: Diagnosis not present

## 2018-11-18 DIAGNOSIS — R7303 Prediabetes: Secondary | ICD-10-CM | POA: Diagnosis not present

## 2018-11-18 DIAGNOSIS — E039 Hypothyroidism, unspecified: Secondary | ICD-10-CM

## 2018-11-18 DIAGNOSIS — M79671 Pain in right foot: Secondary | ICD-10-CM

## 2018-11-18 HISTORY — DX: Pain in right foot: M79.671

## 2018-11-18 MED ORDER — PREDNISONE 10 MG PO TABS: ORAL_TABLET | ORAL | 0 refills | Status: DC

## 2018-11-18 NOTE — Progress Notes (Signed)
Patient ID: Deborah JEZEK, female   DOB: Oct 01, 1955, 63 y.o.   MRN: [id number]  Virtual Visit via Video Note  I connected with Deborah Stewart on 11/18/18 at  7:00 PM EDT by a video enabled telemedicine application and verified that I am speaking with the correct person using two identifiers.  Location: Patient: at home Provider: at home   I discussed the limitations of evaluation and management by telemedicine and the availability of in person appointments. The patient expressed understanding and agreed to proceed.  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  No trauma, fever, and no specific joint swelling.  Pt denies chest pain, increased sob or doe, wheezing, orthopnea, PND, increased LE swelling, palpitations, dizziness or syncope.   Pt denies polydipsia. Denies hyper or hypo thyroid symptoms such as voice, skin or hair change.  Sees podiatry every 2 wks for a callous management, and has appt for July 22   Past Medical History:  Diagnosis Date  . Abnormal Pap smear   . Anxiety   . Arthritis    "knees" (02/06/2017)  . ASCUS on Pap smear   . Breast mass    "I have lumpy breasts" (02/06/2017)  . Coronary artery disease    10/18 PCI/DESx1 to mRCA.   . DDD (degenerative disc disease), cervical    s/p diskectomy   . DDD (degenerative disc disease), lumbar    by MRI  . Diverticulosis of colon (without mention of hemorrhage)   . Dysmenorrhea   . Dyspareunia   . Dysplasia of cervix, low grade (CIN 1)   . Endometriosis   . History of chicken pox   . History of measles   . Hyperlipidemia   . Hypertension   . Pelvic pain in female   . Personal history of colonic polyps  06/2010   hyperplastic, tubular adenoma  . Thyroid nodule   . Ulcerative colitis ([place])   . Unspecified hypothyroidism    Past Surgical History:  Procedure Laterality Date  . ANTERIOR CERVICAL DECOMP/DISCECTOMY FUSION  2004   Dr. Trenton Gammon  . BACK SURGERY    . BREAST BIOPSY Bilateral   . CORONARY ANGIOPLASTY WITH STENT PLACEMENT  02/06/2017  . CORONARY STENT INTERVENTION N/A 02/06/2017   Procedure: CORONARY STENT INTERVENTION;  Surgeon: Belva Crome, MD;  Location: [place] [facility];  Service: Cardiovascular;  Laterality: N/A;  . FOOT SURGERY Right 09/2011    Dr. Ladora Daniel, DPO, removal of "mass"   . FOOT SURGERY Left 1970s X 1  . FOOT SURGERY Right 2000s "multiple"   "botched 1st time; tried to correct several times"  . LEFT HEART CATH AND CORONARY ANGIOGRAPHY N/A 02/06/2017   Procedure: LEFT HEART CATH AND CORONARY ANGIOGRAPHY;  Surgeon: Belva Crome, MD;  Location: [place] [facility];  Service: Cardiovascular;  Laterality: N/A;  . THYROIDECTOMY, PARTIAL  06/2009   "removed goiter"  . TOTAL ABDOMINAL HYSTERECTOMY     TOTAL ABDOMINAL HYSTERECTOMY W/ BILATERAL SALPINGOOPHORECTOMY     reports that she quit smoking about 21 months ago. Her smoking use included cigarettes. She has a 22.50 pack-year smoking history. She has never used smokeless tobacco. She reports current  alcohol use of about 5.0 standard drinks of alcohol per week. She reports that she does not use drugs. family history includes Breast cancer in her sister and another family member; Diabetes in her brother, father, mother, and another family member; Heart disease in her brother, father, and another family member; Hyperlipidemia in her brother; Hypertension in her brother and brother; Lung cancer in an other family member. Allergies  Allergen Reactions  . Codeine     REACTION: nausea and hives   Current Outpatient Medications on File Prior to Visit  Medication Sig Dispense Refill  . atorvastatin (LIPITOR) 80 MG  tablet Take 1 tablet (80 mg total) by mouth daily at 6 PM. 90 tablet 2  . clopidogrel (PLAVIX) 75 MG tablet Take 1 tablet (75 mg total) by mouth daily with breakfast. 90 tablet 2  . cyclobenzaprine (FLEXERIL) 10 MG tablet Take 1 tablet (10 mg total) by mouth every 8 (eight) hours as needed. 45 tablet 6  . hyoscyamine (LEVSIN SL) 0.125 MG SL tablet Take 1 tablet (0.125 mg total) by mouth every 6 (six) hours as needed for cramping. 60 tablet 11  . levothyroxine (SYNTHROID) 100 MCG tablet TAKE 1 TABLET DAILY BEFORE BREAKFAST 90 tablet 1  . mesalamine (LIALDA) 1.2 g EC tablet TAKE 2 TABLETS (2.4 G TOTAL) BY MOUTH DAILY WITH BREAKFAST. (Patient taking differently: Take 1.2 g by mouth as needed. TAKE 2 TABLETS (2.4 G TOTAL) BY MOUTH DAILY WITH BREAKFAST.) 60 tablet 11  . metoprolol succinate (TOPROL-XL) 25 MG 24 hr tablet TAKE 1 TABLET BY MOUTH EVERY DAY 90 tablet 2  . nitroGLYCERIN (NITROSTAT) 0.4 MG SL tablet Place 1 tablet (0.4 mg total) under the tongue every 5 (five) minutes as needed for chest pain. (Patient not taking: Reported on 05/08/2017) 11 tablet 6  . pantoprazole (PROTONIX) 40 MG tablet Take 1 tablet (40 mg total) by mouth daily. (Patient taking differently: Take 40 mg by mouth as needed. ) 90 tablet 3  . potassium chloride SA (K-DUR) 20 MEQ tablet Take 1 tablet (20 mEq total) by mouth 2 (two) times daily. 10 tablet 0  . traMADol (ULTRAM) 50 MG tablet Take 1 tablet (50 mg total) by mouth daily. (Patient taking differently: Take 50 mg by mouth every 12 (twelve) hours as needed. ) 30 tablet 0   No current facility-administered medications on file prior to visit.    Observations/Objective: Alert, NAD, appropriate mood and affect, resps normal, cn 2-12 intact, moves all 4s, no visible rash but has a localized subq nodular like lesion appearing near the dorsal 4th ray mid foot with mild erythema but no insect bite or drainage Lab Results  Component Value Date   WBC 9.2 05/06/2018   HGB 14.4  05/06/2018   HCT 43.9 05/06/2018   PLT 336.0 05/06/2018   GLUCOSE 68 11/13/2018   CHOL 128 05/06/2018   TRIG 106.0 05/06/2018   HDL 38.10 (L) 05/06/2018   LDLDIRECT 112.1 10/11/2012   LDLCALC 69 05/06/2018   ALT 17 11/04/2018   AST 20 11/04/2018   NA 142 11/13/2018   K 4.6 11/13/2018   CL 104 11/13/2018   CREATININE 0.69 11/13/2018   BUN 10 11/13/2018   CO2 23 11/13/2018   TSH 1.51 11/04/2018   INR 1.1 02/01/2017   HGBA1C 6.4 11/04/2018   Assessment and Plan: See notes  Follow Up Instructions: See notes   I discussed the assessment and treatment plan with the patient. The patient was provided an opportunity to ask questions  and all were answered. The patient agreed with the plan and demonstrated an understanding of the instructions.   The patient was advised to call back or seek an in-person evaluation if the symptoms worsen or if the condition fails to improve as anticipated.   Cathlean Cower, MD

## 2018-11-18 NOTE — Patient Instructions (Signed)
Please take all new medication as prescribed - the prednisone  Be sure to follow up with podiatry as you have planned  Please continue all other medications as before, and refills have been done if requested.  Please have the pharmacy call with any other refills you may need.  Please continue your efforts at being more active, low cholesterol diet, and weight control.  Please keep your appointments with your specialists as you may have planned

## 2018-11-18 NOTE — Assessment & Plan Note (Signed)
stable overall by history and exam, recent data reviewed with pt, and pt to continue medical treatment as before,  to f/u any worsening symptoms or concerns  

## 2018-11-18 NOTE — Assessment & Plan Note (Signed)
Exam c/w probable dorsal ganglion cyst, low suspicion for gout, for low dose prednisone, f/u podiatry as planned

## 2018-12-23 ENCOUNTER — Other Ambulatory Visit: Payer: Self-pay | Admitting: Cardiology

## 2018-12-23 ENCOUNTER — Other Ambulatory Visit: Payer: Self-pay | Admitting: Internal Medicine

## 2018-12-23 DIAGNOSIS — I2510 Atherosclerotic heart disease of native coronary artery without angina pectoris: Secondary | ICD-10-CM

## 2019-01-02 IMAGING — CR DG LUMBAR SPINE COMPLETE 4+V
5 series · 5 of 5 positions shown · non-contrast
Comparison: Lumbar MRI 02/07/2004.

CLINICAL DATA: 61-year-old female with chronic lumbar spine pain.

EXAM:
LUMBAR SPINE - COMPLETE 4+ VIEW

[w lumbar spine ap]
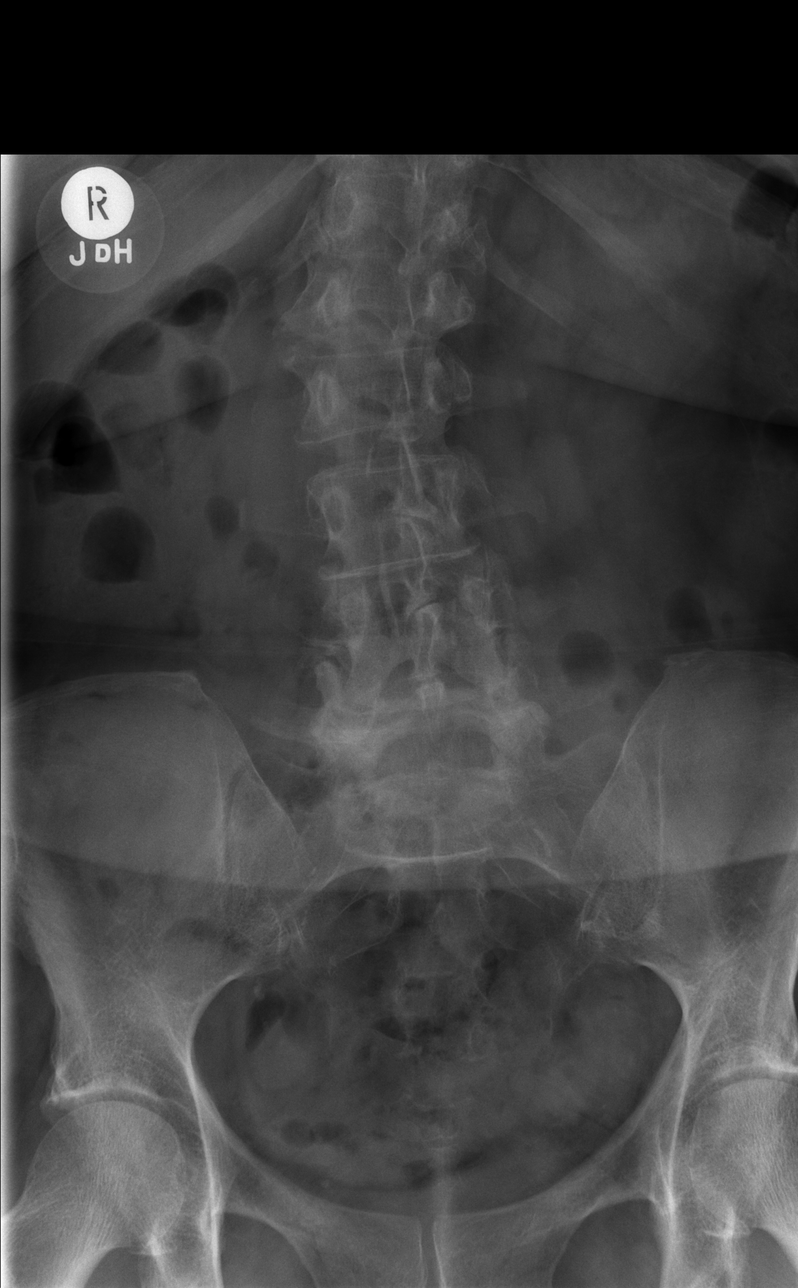

[w lumbar spine obl (1 of 2)]
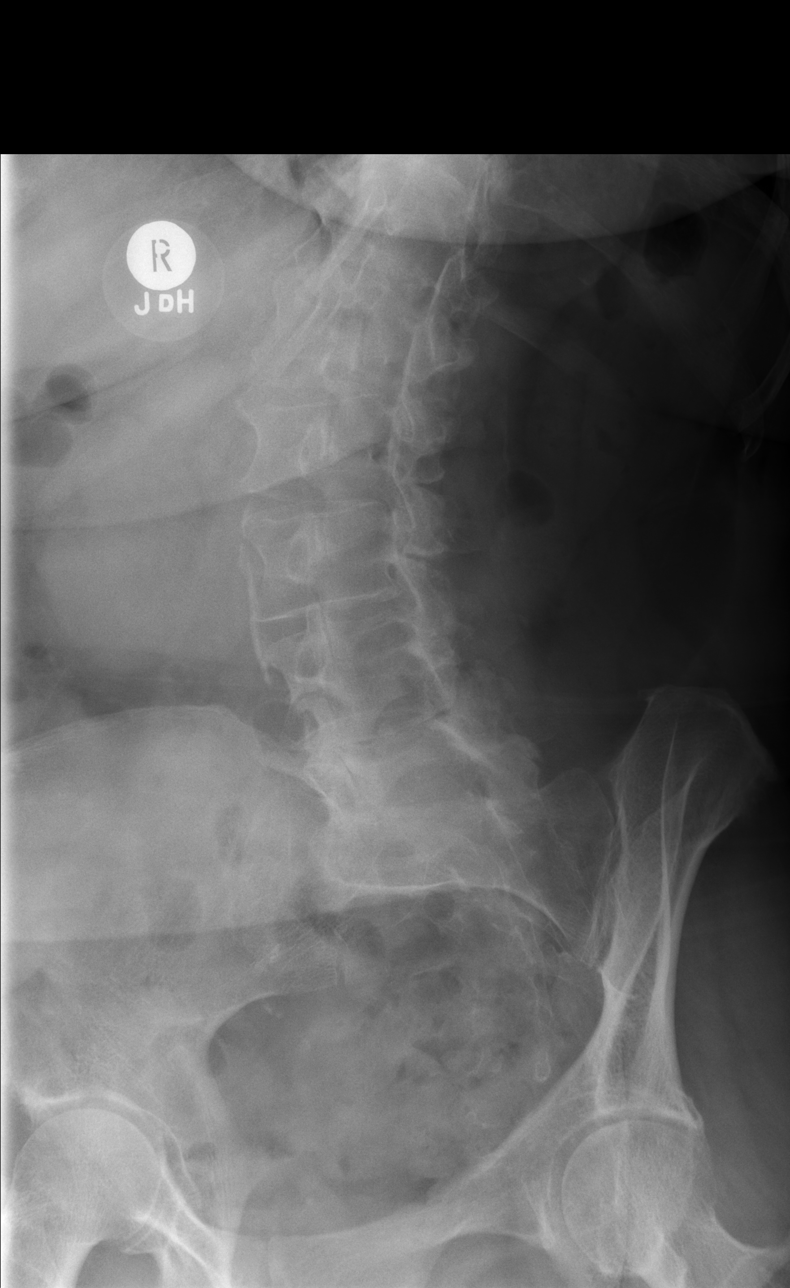

[w lumbar spine obl (2 of 2)]
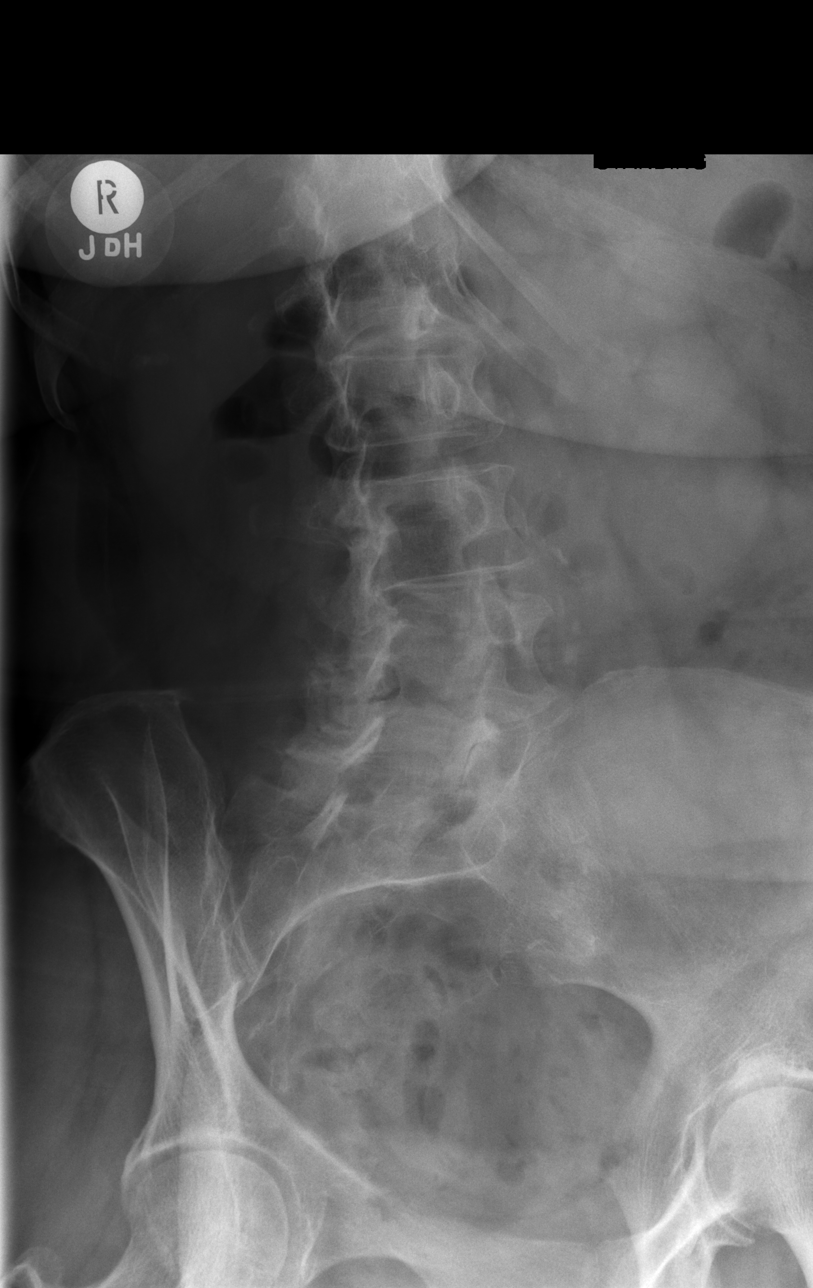

[w lumbar spine lat]
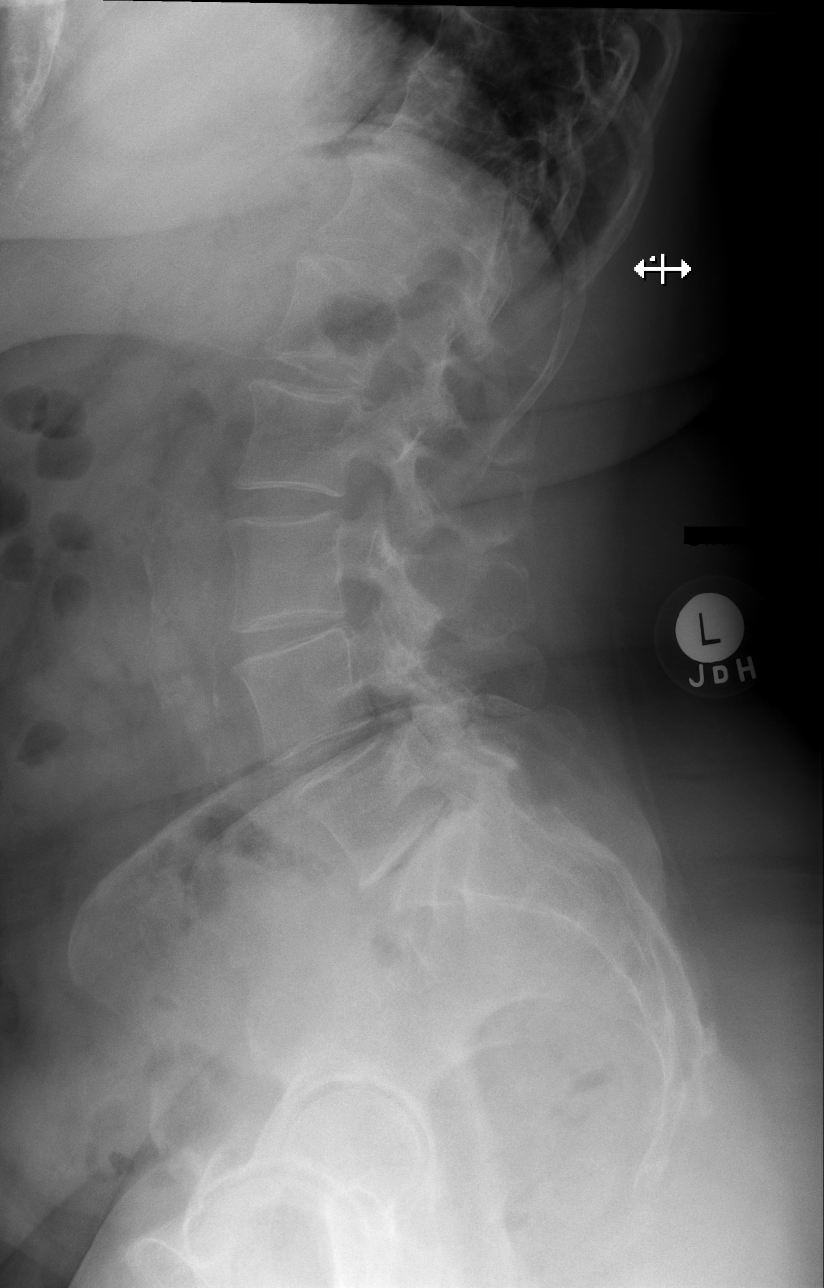

[w lumbar l-5 s-1 spot]
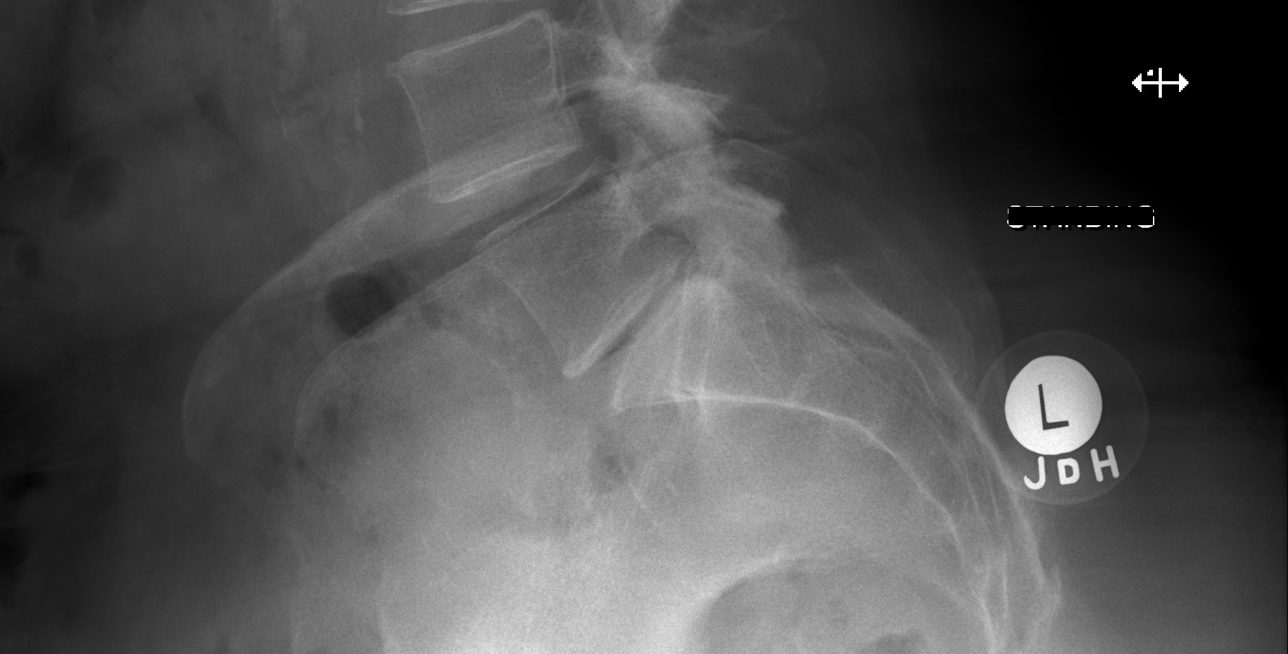

[5 of 5 positions shown; findings below may reference images not displayed]

FINDINGS: Weight-bearing views. Normal lumbar segmentation. Mild to moderate
dextroconvex scoliosis with apex at the thoracolumbar junction. On
the lateral view subtle anterolisthesis of L4 on L5 has developed
since 3446. Otherwise stable lumbar lordosis. No pars fracture. The
sacral ala and SI joints appear normal. No acute osseous abnormality
identified.

Severe disc space loss at L5-S1 with vacuum disc. Other disc spaces
are relatively preserved. There is moderate bilateral L4-L5 and
L5-S1 facet hypertrophy with sclerosis.

Calcified aortic atherosclerosis. Negative visible bowel gas
pattern.
IMPRESSION: 1.  No acute osseous abnormality identified in the lumbar spine.
2. Mild grade 1 spondylolisthesis has developed at L4-L5 since 3446.
There is moderate dextroconvex thoracolumbar scoliosis.
3. Advanced chronic disc degeneration at L5-S1. Moderately advanced
lower lumbar facet degeneration.
4.  Aortic Atherosclerosis ([id number]-LS4.4).

## 2019-01-29 ENCOUNTER — Ambulatory Visit (INDEPENDENT_AMBULATORY_CARE_PROVIDER_SITE_OTHER): Payer: BLUE CROSS/BLUE SHIELD | Admitting: Internal Medicine

## 2019-01-29 ENCOUNTER — Encounter: Payer: Self-pay | Admitting: Internal Medicine

## 2019-01-29 ENCOUNTER — Other Ambulatory Visit: Payer: Self-pay

## 2019-01-29 VITALS — BP 160/90 | HR 62 | Temp 98.7°F | Ht 65.0 in

## 2019-01-29 DIAGNOSIS — M79602 Pain in left arm: Secondary | ICD-10-CM

## 2019-01-29 DIAGNOSIS — Z20828 Contact with and (suspected) exposure to other viral communicable diseases: Secondary | ICD-10-CM

## 2019-01-29 DIAGNOSIS — Z20822 Contact with and (suspected) exposure to covid-19: Secondary | ICD-10-CM

## 2019-01-29 DIAGNOSIS — Z23 Encounter for immunization: Secondary | ICD-10-CM

## 2019-01-29 HISTORY — DX: Pain in left arm: M79.602

## 2019-01-29 MED ORDER — MELOXICAM 15 MG PO TABS: 15.0000 mg | ORAL_TABLET | Freq: Every day | ORAL | 0 refills | Status: DC

## 2019-01-29 MED ORDER — PREDNISONE 20 MG PO TABS: 40.0000 mg | ORAL_TABLET | Freq: Every day | ORAL | 0 refills | Status: DC

## 2019-01-29 NOTE — Progress Notes (Signed)
   Subjective:   Patient ID: Deborah Stewart, female    DOB: [birth year], 63 y.o.   MRN: [id number]  HPI The patient is a 63 YO female coming in for left arm pain. Started about 6 weeks ago with carrying around her grandchild for 4 days straight. She has pain in the biceps region and down to elbow and lower. More with stretching the arm out. Worse if staying in same position for some time. Denies swelling. Denies taking anything for it. Pain 5/10 at times. Overall stable and not improving. She denies numbness or weakness.   Review of Systems  Constitutional: Negative.   HENT: Negative.   Eyes: Negative.   Respiratory: Negative for cough, chest tightness and shortness of breath.   Cardiovascular: Negative for chest pain, palpitations and leg swelling.  Gastrointestinal: Negative for abdominal distention, abdominal pain, constipation, diarrhea, nausea and vomiting.  Musculoskeletal: Positive for myalgias.  Skin: Negative.   Neurological: Negative.   Psychiatric/Behavioral: Negative.     Objective:  Physical Exam Constitutional:      Appearance: She is well-developed.  HENT:     Head: Normocephalic and atraumatic.  Neck:     Musculoskeletal: Normal range of motion.  Cardiovascular:     Rate and Rhythm: Normal rate and regular rhythm.  Pulmonary:     Effort: Pulmonary effort is normal. No respiratory distress.     Breath sounds: Normal breath sounds. No wheezing or rales.  Abdominal:     General: Bowel sounds are normal. There is no distension.     Palpations: Abdomen is soft.     Tenderness: There is no abdominal tenderness. There is no rebound.  Musculoskeletal:        General: Tenderness present. No swelling.     Comments: Tenderness left elbow and left biceps to palpation and ROM full, sensation and pulses intact.  Skin:    General: Skin is warm and dry.  Neurological:     Mental Status: She is alert and oriented to person, place, and time.     Coordination: Coordination  normal.     Vitals:   01/29/19 0955  BP: (!) 160/90  Pulse: 62  Temp: 98.7 F (37.1 C)  TempSrc: Oral  SpO2: 99%  Height: 5\' 5"  (1.651 m)    Assessment & Plan:  Flu shot given at visit

## 2019-01-29 NOTE — Patient Instructions (Signed)
You can get the covid-19 test next Tuesday or Wednesday at the [facility] hospital on [address]. They are open from 9 AM to 3:30 PM M-F.   We have sent in prednisone to take 2 pills daily for 4 days.   We have also sent in meloxicam to take 1 pill daily in case prednisone does not work fully.

## 2019-01-29 NOTE — Assessment & Plan Note (Signed)
Rx prednisone 4 day course. Rx meloxicam for pain. Likely tendonitis related to holding grandchild.

## 2019-02-05 ENCOUNTER — Other Ambulatory Visit: Payer: Self-pay

## 2019-02-05 DIAGNOSIS — Z20822 Contact with and (suspected) exposure to covid-19: Secondary | ICD-10-CM

## 2019-02-06 LAB — NOVEL CORONAVIRUS, NAA: SARS-CoV-2, NAA: NOT DETECTED

## 2019-02-22 ENCOUNTER — Other Ambulatory Visit: Payer: Self-pay | Admitting: Internal Medicine

## 2019-03-10 ENCOUNTER — Ambulatory Visit: Payer: BLUE CROSS/BLUE SHIELD | Admitting: Internal Medicine

## 2019-03-11 ENCOUNTER — Ambulatory Visit: Payer: BLUE CROSS/BLUE SHIELD | Admitting: Gastroenterology

## 2019-03-17 ENCOUNTER — Ambulatory Visit: Payer: BLUE CROSS/BLUE SHIELD | Admitting: Cardiology

## 2019-04-22 ENCOUNTER — Other Ambulatory Visit: Payer: Self-pay | Admitting: Internal Medicine

## 2019-04-23 ENCOUNTER — Other Ambulatory Visit: Payer: Self-pay | Admitting: Cardiology

## 2019-04-23 NOTE — Telephone Encounter (Signed)
Called pt, LVM.   

## 2019-04-23 NOTE — Telephone Encounter (Signed)
Is she still taking this? She was not taking it in July when I saw her and typically this is a short term medicine for 3-6 months to help people stop smoking.

## 2019-04-23 NOTE — Telephone Encounter (Signed)
°*  STAT* If patient is at the pharmacy, call can be transferred to refill team.   1. Which medications need to be refilled? (please list name of each medication and dose if known) Chantix  2. Which pharmacy/location (including street and city if local pharmacy) is medication to be sent to?[facility] on [address]  3. Do they need a 30 day or 90 day supply? [place]

## 2019-04-23 NOTE — Telephone Encounter (Signed)
Patient states she has started smoking cigarettes again due to stress. She states she currently smokes around 6 cigarettes a day and thinks she would benefit from starting this medication again. Per FC, Shirron unavailable. Patient is requesting a call back to discuss further.

## 2019-04-24 MED ORDER — VARENICLINE TARTRATE 1 MG PO TABS: 1.0000 mg | ORAL_TABLET | Freq: Two times a day (BID) | ORAL | 2 refills | Status: DC

## 2019-04-24 MED ORDER — CHANTIX STARTING MONTH PAK 0.5 MG X 11 & 1 MG X 42 PO TABS: ORAL_TABLET | ORAL | 0 refills | Status: DC

## 2019-04-24 NOTE — Telephone Encounter (Signed)
Pt has been informed and expressed understanding.  

## 2019-04-24 NOTE — Telephone Encounter (Signed)
Sent in two prescriptions. With chantix you have to start taking this slowly. The first one is for the titration to start and then refills at the full dose. It is recommended to take this for 3-6 months even if she thinks she is doing well to help her stay quit from cigarettes. Insurance may not pay for this as it typically only pays for this once or so often.

## 2019-04-24 NOTE — Addendum Note (Signed)
Addended by: Pricilla Holm A on: 04/24/2019 07:45 AM   Modules accepted: Orders

## 2019-05-19 ENCOUNTER — Ambulatory Visit: Payer: BLUE CROSS/BLUE SHIELD | Admitting: Cardiology

## 2019-05-21 ENCOUNTER — Ambulatory Visit: Payer: BLUE CROSS/BLUE SHIELD | Admitting: Cardiology

## 2019-05-24 ENCOUNTER — Other Ambulatory Visit: Payer: Self-pay | Admitting: Internal Medicine

## 2019-06-12 ENCOUNTER — Ambulatory Visit (INDEPENDENT_AMBULATORY_CARE_PROVIDER_SITE_OTHER): Payer: 59 | Admitting: Cardiology

## 2019-06-12 ENCOUNTER — Other Ambulatory Visit: Payer: Self-pay

## 2019-06-12 ENCOUNTER — Encounter: Payer: Self-pay | Admitting: Cardiology

## 2019-06-12 VITALS — BP 132/82 | HR 57 | Temp 98.0°F | Ht 65.0 in | Wt 186.1 lb

## 2019-06-12 DIAGNOSIS — Z87891 Personal history of nicotine dependence: Secondary | ICD-10-CM | POA: Diagnosis not present

## 2019-06-12 DIAGNOSIS — I10 Essential (primary) hypertension: Secondary | ICD-10-CM | POA: Diagnosis not present

## 2019-06-12 DIAGNOSIS — I2510 Atherosclerotic heart disease of native coronary artery without angina pectoris: Secondary | ICD-10-CM

## 2019-06-12 DIAGNOSIS — E785 Hyperlipidemia, unspecified: Secondary | ICD-10-CM | POA: Diagnosis not present

## 2019-06-12 DIAGNOSIS — Z1329 Encounter for screening for other suspected endocrine disorder: Secondary | ICD-10-CM

## 2019-06-12 NOTE — Progress Notes (Signed)
Cardiology Office Note:    Date:  06/12/2019   ID:  Deborah Stewart, DOB [birth year], MRN [id number]  PCP:  Hoyt Koch, MD  Cardiologist:  Jenean Lindau, MD   Referring MD: Hoyt Koch, *    ASSESSMENT:    1. Essential hypertension   2. Coronary artery disease involving native coronary artery of native heart without angina pectoris   3. Dyslipidemia   4. Former smoker    PLAN:    In order of problems listed above:  1. Coronary artery disease: Secondary prevention stressed with the patient.  Importance of compliance with diet and medication stressed and she vocalized understanding.  Her symptoms are atypical for coronary etiology and much different from the symptoms she has had in the past.  I educated her about sublingual nitroglycerin use.  She vocalized understanding.  In view of this and risk factors she will have a Lexiscan sestamibi in the next few days.  She knows to go to the nearest emergency room for any concerning symptoms. 2. Essential hypertension: Blood pressure is stable.  Diet was discussed. 3. Mixed dyslipidemia: Diet was discussed.  She will have complete blood work today. 4. Follow-up appointment in a month or earlier if she has any concerns.  Once the test is done and if it is fine I asked her to embark on exercise program diet and lose weight also and she promises to do so.  Patient had multiple questions which were answered to her satisfaction.   Medication Adjustments/Labs and Tests Ordered: Current medicines are reviewed at length with the patient today.  Concerns regarding medicines are outlined above.  No orders of the defined types were placed in this encounter.  No orders of the defined types were placed in this encounter.    No chief complaint on file.    History of Present Illness:    Deborah Stewart is a 64 y.o. female.  Patient has past medical history of essential hypertension, mixed dyslipidemia and leads a  sedentary lifestyle.  She has coronary artery disease post stenting.  She tells me that she occasionally has chest discomfort and this is not related to exertion.  This chest discomfort and symptoms are very unlike the symptoms she has had before.  At the time of my evaluation, the patient is alert awake oriented and in no distress.  She has never used nitroglycerin and is a little concerned about using it.  Past Medical History:  Diagnosis Date  . Abnormal Pap smear   . Anxiety   . Arthritis    "knees" (02/06/2017)  . ASCUS on Pap smear   . Breast mass    "I have lumpy breasts" (02/06/2017)  . Coronary artery disease    10/18 PCI/DESx1 to mRCA.   . DDD (degenerative disc disease), cervical    s/p diskectomy   . DDD (degenerative disc disease), lumbar    by MRI  . Diverticulosis of colon (without mention of hemorrhage)   . Dysmenorrhea   . Dyspareunia   . Dysplasia of cervix, low grade (CIN 1)   . Endometriosis   . History of chicken pox   . History of measles   . Hyperlipidemia   . Hypertension   . Pelvic pain in female   . Personal history of colonic polyps 06/2010   hyperplastic, tubular adenoma  . Thyroid nodule   . Ulcerative colitis ([place])   . Unspecified hypothyroidism     Past Surgical History:  Procedure Laterality  Date  . ANTERIOR CERVICAL DECOMP/DISCECTOMY FUSION  2004   Dr. Trenton Gammon  . BACK SURGERY    . BREAST BIOPSY Bilateral   . CORONARY ANGIOPLASTY WITH STENT PLACEMENT  02/06/2017  . CORONARY STENT INTERVENTION N/A 02/06/2017   Procedure: CORONARY STENT INTERVENTION;  Surgeon: Belva Crome, MD;  Location: [facility];  Service: Cardiovascular;  Laterality: N/A;  . FOOT SURGERY Right 09/2011    Dr. Ladora Daniel, DPO, removal of "mass"   . FOOT SURGERY Left 1970s X 1  . FOOT SURGERY Right 2000s "multiple"   "botched 1st time; tried to correct several times"  . LEFT HEART CATH AND CORONARY ANGIOGRAPHY N/A 02/06/2017   Procedure: LEFT HEART CATH AND  CORONARY ANGIOGRAPHY;  Surgeon: Belva Crome, MD;  Location: [facility];  Service: Cardiovascular;  Laterality: N/A;  . THYROIDECTOMY, PARTIAL  06/2009   "removed goiter"  . TOTAL ABDOMINAL HYSTERECTOMY     TOTAL ABDOMINAL HYSTERECTOMY W/ BILATERAL SALPINGOOPHORECTOMY     Current Medications: No outpatient medications have been marked as taking for the 06/12/19 encounter (Office Visit) with Marlene Pfluger, Reita Cliche, MD.     Allergies:   Codeine   Social History   Socioeconomic History  . Marital status: Divorced    Spouse name: Not on file  . Number of children: 1  . Years of education: 1  . Highest education level: Not on file  Occupational History  . Occupation: filed Nurse, mental health: [employer]  Tobacco Use  . Smoking status: Former Smoker    Packs/day: 0.50    Years: 45.00    Pack years: 22.50    Types: Cigarettes    Quit date: 02/06/2017    Years since quitting: 2.3  . Smokeless tobacco: Never Used  . Tobacco comment: 3 cigs a week---taking Chantix  Substance and Sexual Activity  . Alcohol use: Yes    Alcohol/week: 5.0 standard drinks    Types: 5 Standard drinks or equivalent per week    Comment: 02/06/2017 "might have 1-4 mixed drinks/month"  . Drug use: No  . Sexual activity: Not on file  Other Topics Concern  . Not on file  Social History Narrative   [employer], [employer] -BS social work. Married 1980 - 61 yrs/divorced. 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 Dating - long term relationship/[place]. Unprotected. No history of physical or sexual abuse.    Social Determinants of Health   Financial Resource Strain:   . Difficulty of Paying Living Expenses: Not on file  Food Insecurity:   . Worried About Charity fundraiser in the Last Year: Not on file  . Ran Out of Food in the Last Year: Not on file  Transportation Needs:   . Lack of Transportation (Medical): Not on file  . Lack of Transportation (Non-Medical): Not on file    Physical Activity:   . Days of Exercise per Week: Not on file  . Minutes of Exercise per Session: Not on file  Stress:   . Feeling of Stress : Not on file  Social Connections:   . Frequency of Communication with Friends and Family: Not on file  . Frequency of Social Gatherings with Friends and Family: Not on file  . Attends Religious Services: Not on file  . Active Member of Clubs or Organizations: Not on file  . Attends Archivist Meetings: Not on file  . Marital Status: Not on file  Family History: The patient's family history includes Breast cancer in her sister and another family member; Diabetes in her brother, father, mother, and another family member; Heart disease in her brother, father, and another family member; Hyperlipidemia in her brother; Hypertension in her brother and brother; Lung cancer in an other family member. There is no history of Colon cancer.  ROS:   Please see the history of present illness.    All other systems reviewed and are negative.  EKGs/Labs/Other Studies Reviewed:    The following studies were reviewed today:  Study date: 02/06/17  MyChart Results Release  MyChart Status: Active Results Release  Physicians  Panel Physicians Referring Physician Case Authorizing Physician  Belva Crome, MD (Primary)    Procedures  CORONARY STENT INTERVENTION  LEFT HEART CATH AND CORONARY ANGIOGRAPHY  Conclusion   Class III angina pectoris occurring with minimal physical activity and early positive excise treadmill test.  Segmental 95+ percent stenosis in the mid right coronary. Proximal RCA contains 30% narrowing.  Luminal irregularities noted in the circumflex coronary artery, first diagonal, and 50% narrowing in the mid LAD.  Successful PCI with stent implantation reducing 95% stenosis to 0% with TIMI grade 3 flow using a Xience Sierra 3.0 x 23 mm postdilated to 4.0. TIMI grade 3 flow is noted.  RECOMMENDATIONS:   Aspirin and  Plavix 6 months and optimally 12 months if possible.  Low-dose beta blocker therapy.  High intensity statin therapy.  Smoking cessation.   Phase II cardiac rehabilitation if possible.      Recent Labs: 11/04/2018: ALT 17; Magnesium 1.8; TSH 1.51 11/13/2018: BUN 10; Creatinine, Ser 0.69; Potassium 4.6; Sodium 142  Recent Lipid Panel    Component Value Date/Time   CHOL 128 05/06/2018 1105   CHOL 117 05/09/2017 1235   TRIG 106.0 05/06/2018 1105   HDL 38.10 (L) 05/06/2018 1105   HDL 37 (L) 05/09/2017 1235   CHOLHDL 3 05/06/2018 1105   VLDL 21.2 05/06/2018 1105   LDLCALC 69 05/06/2018 1105   LDLCALC 65 05/09/2017 1235   LDLDIRECT 112.1 10/11/2012 1554    Physical Exam:    VS:  BP 132/82   Pulse (!) 57     Wt Readings from Last 3 Encounters:  11/13/18 188 lb (85.3 kg)  11/04/18 188 lb (85.3 kg)  05/17/18 191 lb (86.6 kg)     GEN: Patient is in no acute distress HEENT: Normal NECK: No JVD; No carotid bruits LYMPHATICS: No lymphadenopathy CARDIAC: Hear sounds regular, 2/6 systolic murmur at the apex. RESPIRATORY:  Clear to auscultation without rales, wheezing or rhonchi  ABDOMEN: Soft, non-tender, non-distended MUSCULOSKELETAL:  No edema; No deformity  SKIN: Warm and dry NEUROLOGIC:  Alert and oriented x 3 PSYCHIATRIC:  Normal affect   Signed, Jenean Lindau, MD  06/12/2019 10:10 AM    [place]

## 2019-06-12 NOTE — Patient Instructions (Signed)
Medication Instructions:  No medication changes *If you need a refill on your cardiac medications before your next appointment, please call your pharmacy*  Lab Work: You had a BMET, CBC, TSH, LFT and lipids done today. If you have labs (blood work) drawn today and your tests are completely normal, you will receive your results only by: Marland Kitchen MyChart Message (if you have MyChart) OR . A paper copy in the mail If you have any lab test that is abnormal or we need to change your treatment, we will call you to review the results.  Testing/Procedures: Your physician has requested that you have a lexiscan myoview. For further information please visit [email or web address]. Please follow instruction sheet, as given.   Follow-Up: At [facility], you and your health needs are our priority.  As part of our continuing mission to provide you with exceptional heart care, we have created designated Provider Care Teams.  These Care Teams include your primary Cardiologist (physician) and Advanced Practice Providers (APPs -  Physician Assistants and Nurse Practitioners) who all work together to provide you with the care you need, when you need it.  Your next appointment:   1 month(s)  The format for your next appointment:   In Person  Provider:   Jyl Heinz, MD  Other Instructions NA

## 2019-06-13 LAB — LIPID PANEL
Chol/HDL Ratio: 3.6 ratio (ref 0.0–4.4)
Cholesterol, Total: 139 mg/dL (ref 100–199)
HDL: 39 mg/dL — ABNORMAL LOW (ref >39)
LDL Chol Calc (NIH): 78 mg/dL (ref 0–99)
Triglycerides: 125 mg/dL (ref 0–149)
VLDL Cholesterol Cal: 22 mg/dL (ref 5–40)

## 2019-06-13 LAB — CBC WITH DIFFERENTIAL/PLATELET
Basophils Absolute: 0 10*3/uL (ref 0.0–0.2)
Basos: 0 %
EOS (ABSOLUTE): 0.1 10*3/uL (ref 0.0–0.4)
Eos: 1 %
Hematocrit: 44.5 % (ref 34.0–46.6)
Hemoglobin: 15.2 g/dL (ref 11.1–15.9)
Immature Grans (Abs): 0 10*3/uL (ref 0.0–0.1)
Immature Granulocytes: 0 %
Lymphocytes Absolute: 3.1 10*3/uL (ref 0.7–3.1)
Lymphs: 31 %
MCH: 30.6 pg (ref 26.6–33.0)
MCHC: 34.2 g/dL (ref 31.5–35.7)
MCV: 90 fL (ref 79–97)
Monocytes Absolute: 0.6 10*3/uL (ref 0.1–0.9)
Monocytes: 6 %
Neutrophils Absolute: 6.2 10*3/uL (ref 1.4–7.0)
Neutrophils: 62 %
Platelets: 332 10*3/uL (ref 150–450)
RBC: 4.97 x10E6/uL (ref 3.77–5.28)
RDW: 13.1 % (ref 11.7–15.4)
WBC: 10 10*3/uL (ref 3.4–10.8)

## 2019-06-13 LAB — BASIC METABOLIC PANEL
BUN/Creatinine Ratio: 13 (ref 12–28)
BUN: 10 mg/dL (ref 8–27)
CO2: 23 mmol/L (ref 20–29)
Calcium: 9.6 mg/dL (ref 8.7–10.3)
Chloride: 101 mmol/L (ref 96–106)
Creatinine, Ser: 0.75 mg/dL (ref 0.57–1.00)
GFR calc Af Amer: 98 mL/min/{1.73_m2} (ref >59)
GFR calc non Af Amer: 85 mL/min/{1.73_m2} (ref >59)
Glucose: 80 mg/dL (ref 65–99)
Potassium: 4.6 mmol/L (ref 3.5–5.2)
Sodium: 142 mmol/L (ref 134–144)

## 2019-06-13 LAB — HEPATIC FUNCTION PANEL
ALT: 20 IU/L (ref 0–32)
AST: 24 IU/L (ref 0–40)
Albumin: 4.2 g/dL (ref 3.8–4.8)
Alkaline Phosphatase: 101 IU/L (ref 39–117)
Bilirubin Total: 0.3 mg/dL (ref 0.0–1.2)
Bilirubin, Direct: 0.1 mg/dL (ref 0.00–0.40)
Total Protein: 7.3 g/dL (ref 6.0–8.5)

## 2019-06-13 LAB — TSH: TSH: 4.95 u[IU]/mL — ABNORMAL HIGH (ref 0.450–4.500)

## 2019-06-18 ENCOUNTER — Telehealth (HOSPITAL_COMMUNITY): Payer: Self-pay

## 2019-06-18 NOTE — Telephone Encounter (Signed)
Encounter complete. 

## 2019-06-19 ENCOUNTER — Telehealth (HOSPITAL_COMMUNITY): Payer: Self-pay | Admitting: *Deleted

## 2019-06-19 NOTE — Telephone Encounter (Signed)
Left Message

## 2019-06-20 ENCOUNTER — Ambulatory Visit (HOSPITAL_COMMUNITY): Admission: RE | Admit: 2019-06-20 | Payer: 59 | Source: Ambulatory Visit | Attending: Cardiology | Admitting: Cardiology

## 2019-06-25 ENCOUNTER — Other Ambulatory Visit: Payer: Self-pay | Admitting: Cardiology

## 2019-07-10 ENCOUNTER — Encounter: Payer: Self-pay | Admitting: Cardiology

## 2019-07-10 ENCOUNTER — Other Ambulatory Visit: Payer: Self-pay

## 2019-07-10 ENCOUNTER — Ambulatory Visit (INDEPENDENT_AMBULATORY_CARE_PROVIDER_SITE_OTHER): Payer: 59 | Admitting: Cardiology

## 2019-07-10 VITALS — BP 126/88 | HR 66 | Ht 65.0 in | Wt 189.0 lb

## 2019-07-10 DIAGNOSIS — I10 Essential (primary) hypertension: Secondary | ICD-10-CM | POA: Diagnosis not present

## 2019-07-10 DIAGNOSIS — I2510 Atherosclerotic heart disease of native coronary artery without angina pectoris: Secondary | ICD-10-CM

## 2019-07-10 DIAGNOSIS — I209 Angina pectoris, unspecified: Secondary | ICD-10-CM

## 2019-07-10 DIAGNOSIS — Z87891 Personal history of nicotine dependence: Secondary | ICD-10-CM

## 2019-07-10 DIAGNOSIS — I739 Peripheral vascular disease, unspecified: Secondary | ICD-10-CM | POA: Insufficient documentation

## 2019-07-10 DIAGNOSIS — E782 Mixed hyperlipidemia: Secondary | ICD-10-CM

## 2019-07-10 HISTORY — DX: Peripheral vascular disease, unspecified: I73.9

## 2019-07-10 MED ORDER — NITROGLYCERIN 0.4 MG SL SUBL: 0.4000 mg | SUBLINGUAL_TABLET | SUBLINGUAL | 6 refills | Status: DC | PRN

## 2019-07-10 NOTE — Patient Instructions (Signed)
Medication Instructions:  No medication changes *If you need a refill on your cardiac medications before your next appointment, please call your pharmacy*   Lab Work: None ordered If you have labs (blood work) drawn today and your tests are completely normal, you will receive your results only by: Marland Kitchen MyChart Message (if you have MyChart) OR . A paper copy in the mail If you have any lab test that is abnormal or we need to change your treatment, we will call you to review the results.   Testing/Procedures: Your physician has requested that you have an ankle brachial index (ABI). During this test an ultrasound and blood pressure cuff are used to evaluate the arteries that supply the arms and legs with blood. Allow thirty minutes for this exam. There are no restrictions or special instructions.    Follow-Up: At [facility], you and your health needs are our priority.  As part of our continuing mission to provide you with exceptional heart care, we have created designated Provider Care Teams.  These Care Teams include your primary Cardiologist (physician) and Advanced Practice Providers (APPs -  Physician Assistants and Nurse Practitioners) who all work together to provide you with the care you need, when you need it.  We recommend signing up for the patient portal called "MyChart".  Sign up information is provided on this After Visit Summary.  MyChart is used to connect with patients for Virtual Visits (Telemedicine).  Patients are able to view lab/test results, encounter notes, upcoming appointments, etc.  Non-urgent messages can be sent to your provider as well.   To learn more about what you can do with MyChart, go to [email or web address].    Your next appointment:   6 month(s)  The format for your next appointment:   In Person  Provider:   Jyl Heinz, MD   Other Instructions  Ankle-Brachial Index Test Why am I having this test? The ankle-brachial index (ABI) test is  used to diagnose peripheral vascular disease (PVD). PVD is also known as peripheral arterial disease (PAD). PVD is the blocking or hardening of the arteries anywhere within the circulatory system beyond the heart. PVD is caused by:  Cholesterol deposits in your blood vessels (atherosclerosis). This is the most common cause of this condition.  Irritation and swelling (inflammation) in the blood vessels.  Blood clots in the vessels. Cholesterol deposits cause arteries to narrow. Normal delivery of oxygen to your tissues is affected, causing muscle pain and fatigue. This is called claudication. PVD means that there may also be a buildup of cholesterol:  In your heart. This increases the risk of heart attacks.  In your brain. This increases the risk of strokes. What is being tested? The ankle-brachial index test measures the blood flow in your arms and legs. The blood flow will show if blood vessels in your legs have been narrowed by cholesterol deposits. How do I prepare for this test?  Wear loose clothing.  Do not use any tobacco products, including cigarettes, chewing tobacco, or e-cigarettes, for at least 30 minutes before the test. What happens during the test?  1. You will lie down in a resting position. 2. Your health care provider will use a blood pressure machine and a small ultrasound device (Doppler) to measure the systolic pressures on your upper arms and ankles. Systolic pressure is the pressure inside your arteries when your heart pumps. 3. Systolic pressure measurements will be taken several times, and at several points, on both the ankle and the  arm. 4. Your health care provider will divide the highest systolic pressure of the ankle by the highest systolic pressure of the arm. The result is the ankle-brachial pressure ratio, or ABI. Sometimes this test will be repeated after you have exercised on a treadmill for 5 minutes. You may have leg pain during the exercise portion of the  test if you suffer from PVD. If the index number drops after exercise, this may show that PVD is present. How are the results reported? Your test results will be reported as a value that shows the ratio of your ankle pressure to your arm pressure (ABI ratio). Your health care provider will compare your results to normal ranges that were established after testing a large group of people (reference ranges). Reference ranges may vary among labs and hospitals. For this test, a common reference range is:  ABI ratio of 0.9 to 1.3. What do the results mean? An ABI ratio that is below the reference range is considered abnormal and may indicate PVD in the legs. Talk with your health care provider about what your results mean. Questions to ask your health care provider Ask your health care provider, or the department that is doing the test:  When will my results be ready?  How will I get my results?  What are my treatment options?  What other tests do I need?  What are my next steps? Summary  The ankle-brachial index (ABI) test is used to diagnose peripheral vascular disease (PVD). PVD is also known as peripheral arterial disease (PAD).  The ankle-brachial index test measures the blood flow in your arms and legs.  The highest systolic pressure of the ankle is divided by the highest systolic pressure of the arm. The result is the ABI ratio.  An ABI ratio that is below 0.9 is considered abnormal and may indicate PVD in the legs. This information is not intended to replace advice given to you by your health care provider. Make sure you discuss any questions you have with your health care provider. Document Revised: 02/07/2018 Document Reviewed: 01/09/2017 Elsevier Patient Education  2020 [employer].

## 2019-07-10 NOTE — Progress Notes (Signed)
Cardiology Office Note:    Date:  07/10/2019   ID:  Deborah Stewart, DOB 09-22-1955, MRN [id number]  PCP:  Hoyt Koch, MD  Cardiologist:  Jenean Lindau, MD   Referring MD: Hoyt Koch, *    ASSESSMENT:    1. Coronary artery disease involving native coronary artery of native heart without angina pectoris   2. Angina pectoris ([place])   3. Essential hypertension   4. Former smoker   5. Mixed dyslipidemia    PLAN:    In order of problems listed above:  1. Coronary artery disease: Secondary prevention stressed with the patient.  Importance of compliance with diet and medication stressed and she vocalized understanding.  She has not had an episode of chest pain since her last evaluation.  She is not keen on a stress testing at this time.  I respect her wishes. 2. Sublingual nitroglycerin prescription was sent, its protocol and 911 protocol explained and the patient vocalized understanding questions were answered to the patient's satisfaction 3. Essential hypertension: Blood pressure is stable and diet was discussed 4. Mixed dyslipidemia: Diet for discussed.  Lipids were reviewed.  Weight reduction was stressed and she promises to do better. 5. She complains of some intermittent claudication-like symptoms and we will check her ABIs in the coming weeks. 6. Patient will be seen in follow-up appointment in 6 months or earlier if the patient has any concerns    Medication Adjustments/Labs and Tests Ordered: Current medicines are reviewed at length with the patient today.  Concerns regarding medicines are outlined above.  No orders of the defined types were placed in this encounter.  No orders of the defined types were placed in this encounter.    Chief Complaint  Patient presents with  . Follow-up    1 Month     History of Present Illness:    Deborah Stewart is a 64 y.o. female.  Patient has history of coronary artery disease post stenting to the  right coronary artery in 2018, essential hypertension dyslipidemia.  She denies any problems at this time.  She was upset when her stress test was denied and subsequently has started walking to the best of her ability.  She has orthopedic issues affecting her feet.  No chest pain orthopnea or PND.  At the time of my evaluation, the patient is alert awake oriented and in no distress.  Past Medical History:  Diagnosis Date  . Abnormal Pap smear   . Anxiety   . Arthritis    "knees" (02/06/2017)  . ASCUS on Pap smear   . Breast mass    "I have lumpy breasts" (02/06/2017)  . Coronary artery disease    10/18 PCI/DESx1 to mRCA.   . DDD (degenerative disc disease), cervical    s/p diskectomy   . DDD (degenerative disc disease), lumbar    by MRI  . Diverticulosis of colon (without mention of hemorrhage)   . Dysmenorrhea   . Dyspareunia   . Dysplasia of cervix, low grade (CIN 1)   . Endometriosis   . History of chicken pox   . History of measles   . Hyperlipidemia   . Hypertension   . Pelvic pain in female   . Personal history of colonic polyps 06/2010   hyperplastic, tubular adenoma  . Thyroid nodule   . Ulcerative colitis ([place])   . Unspecified hypothyroidism     Past Surgical History:  Procedure Laterality Date  . ANTERIOR CERVICAL DECOMP/DISCECTOMY FUSION  2004   Dr. Trenton Gammon  . BACK SURGERY    . BREAST BIOPSY Bilateral   . CORONARY ANGIOPLASTY WITH STENT PLACEMENT  02/06/2017  . CORONARY STENT INTERVENTION N/A 02/06/2017   Procedure: CORONARY STENT INTERVENTION;  Surgeon: Belva Crome, MD;  Location: [facility];  Service: Cardiovascular;  Laterality: N/A;  . FOOT SURGERY Right 09/2011    Dr. Ladora Daniel, DPO, removal of "mass"   . FOOT SURGERY Left 1970s X 1  . FOOT SURGERY Right 2000s "multiple"   "botched 1st time; tried to correct several times"  . LEFT HEART CATH AND CORONARY ANGIOGRAPHY N/A 02/06/2017   Procedure: LEFT HEART CATH AND CORONARY ANGIOGRAPHY;   Surgeon: Belva Crome, MD;  Location: [facility];  Service: Cardiovascular;  Laterality: N/A;  . THYROIDECTOMY, PARTIAL  06/2009   "removed goiter"  . TOTAL ABDOMINAL HYSTERECTOMY     TOTAL ABDOMINAL HYSTERECTOMY W/ BILATERAL SALPINGOOPHORECTOMY     Current Medications: Current Meds  Medication Sig  . atorvastatin (LIPITOR) 80 MG tablet TAKE 1 TABLET BY MOUTH DAILY AT 6 PM.  . clopidogrel (PLAVIX) 75 MG tablet TAKE 1 TABLET (75 MG TOTAL) BY MOUTH DAILY WITH BREAKFAST.  . cyclobenzaprine (FLEXERIL) 10 MG tablet Take 1 tablet (10 mg total) by mouth every 8 (eight) hours as needed.  Marland Kitchen levothyroxine (SYNTHROID) 100 MCG tablet TAKE 1 TABLET BY MOUTH DAILY BEFORE BREAKFAST  . metoprolol succinate (TOPROL-XL) 25 MG 24 hr tablet TAKE 1 TABLET BY MOUTH EVERY DAY  . nitroGLYCERIN (NITROSTAT) 0.4 MG SL tablet Place 1 tablet (0.4 mg total) under the tongue every 5 (five) minutes as needed for chest pain.  . pantoprazole (PROTONIX) 40 MG tablet TAKE 1 TABLET BY MOUTH EVERY DAY  . varenicline (CHANTIX STARTING MONTH PAK) 0.5 MG X 11 & 1 MG X 42 tablet Take one 0.5 mg tablet by mouth once daily for 3 days, then increase to one 0.5 mg tablet twice daily for 4 days, then increase to one 1 mg tablet twice daily.     Allergies:   Codeine   Social History   Socioeconomic History  . Marital status: Divorced    Spouse name: Not on file  . Number of children: 1  . Years of education: 26  . Highest education level: Not on file  Occupational History  . Occupation: filed Nurse, mental health: [employer]  Tobacco Use  . Smoking status: Former Smoker    Packs/day: 0.50    Years: 45.00    Pack years: 22.50    Types: Cigarettes    Start date: 12/31/2018    Quit date: 05/29/2019    Years since quitting: 0.1  . Smokeless tobacco: Never Used  . Tobacco comment: taking Chantix  Substance and Sexual Activity  . Alcohol use: Yes    Alcohol/week: 5.0 standard drinks    Types: 5 Standard drinks or  equivalent per week    Comment: 02/06/2017 "might have 1-4 mixed drinks/month"  . Drug use: No  . Sexual activity: Not on file  Other Topics Concern  . Not on file  Social History Narrative   [employer], [employer] -BS social work. Married 1980 - 9 yrs/divorced. 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 Dating - long term relationship/[place]. Unprotected. No history of physical or sexual abuse.    Social Determinants of Health   Financial Resource Strain:   . Difficulty of Paying Living Expenses:   Food Insecurity:   .  Worried About Charity fundraiser in the Last Year:   . Arboriculturist in the Last Year:   Transportation Needs:   . Film/video editor (Medical):   Marland Kitchen Lack of Transportation (Non-Medical):   Physical Activity:   . Days of Exercise per Week:   . Minutes of Exercise per Session:   Stress:   . Feeling of Stress :   Social Connections:   . Frequency of Communication with Friends and Family:   . Frequency of Social Gatherings with Friends and Family:   . Attends Religious Services:   . Active Member of Clubs or Organizations:   . Attends Archivist Meetings:   Marland Kitchen Marital Status:      Family History: The patient's family history includes Breast cancer in her sister and another family member; Diabetes in her brother, father, mother, and another family member; Heart disease in her brother, father, and another family member; Hyperlipidemia in her brother; Hypertension in her brother and brother; Lung cancer in an other family member. There is no history of Colon cancer.  ROS:   Please see the history of present illness.    All other systems reviewed and are negative.  EKGs/Labs/Other Studies Reviewed:    The following studies were reviewed today: I discussed my findings with the patient at extensive length and lipids were reviewed   Recent Labs: 11/04/2018: Magnesium 1.8 06/12/2019: ALT 20; BUN 10; Creatinine, Ser 0.75; Hemoglobin  15.2; Platelets 332; Potassium 4.6; Sodium 142; TSH 4.950  Recent Lipid Panel    Component Value Date/Time   CHOL 139 06/12/2019 1114   TRIG 125 06/12/2019 1114   HDL 39 (L) 06/12/2019 1114   CHOLHDL 3.6 06/12/2019 1114   CHOLHDL 3 05/06/2018 1105   VLDL 21.2 05/06/2018 1105   LDLCALC 78 06/12/2019 1114   LDLDIRECT 112.1 10/11/2012 1554    Physical Exam:    VS:  BP 126/88   Pulse 66   Ht 5\' 5"  (1.651 m)   Wt 189 lb (85.7 kg)   SpO2 98%   BMI 31.45 kg/m     Wt Readings from Last 3 Encounters:  07/10/19 189 lb (85.7 kg)  06/12/19 186 lb 1.9 oz (84.4 kg)  11/13/18 188 lb (85.3 kg)     GEN: Patient is in no acute distress HEENT: Normal NECK: No JVD; No carotid bruits LYMPHATICS: No lymphadenopathy CARDIAC: Hear sounds regular, 2/6 systolic murmur at the apex. RESPIRATORY:  Clear to auscultation without rales, wheezing or rhonchi  ABDOMEN: Soft, non-tender, non-distended MUSCULOSKELETAL:  No edema; No deformity peripheral pulses are diminished. SKIN: Warm and dry NEUROLOGIC:  Alert and oriented x 3 PSYCHIATRIC:  Normal affect   Signed, Jenean Lindau, MD  07/10/2019 10:05 AM    [place]

## 2019-07-15 ENCOUNTER — Ambulatory Visit (HOSPITAL_BASED_OUTPATIENT_CLINIC_OR_DEPARTMENT_OTHER)
Admission: RE | Admit: 2019-07-15 | Discharge: 2019-07-15 | Disposition: A | Discharge status: Home / Self Care | Payer: 59 | Source: Ambulatory Visit | Attending: Cardiology | Admitting: Cardiology

## 2019-07-15 ENCOUNTER — Other Ambulatory Visit: Payer: Self-pay

## 2019-07-15 DIAGNOSIS — I739 Peripheral vascular disease, unspecified: Secondary | ICD-10-CM | POA: Insufficient documentation

## 2019-07-15 NOTE — Progress Notes (Signed)
  Echocardiogram 2D Echocardiogram has been performed.  Deborah Stewart 07/15/2019, 1:51 PM

## 2019-07-20 ENCOUNTER — Other Ambulatory Visit: Payer: Self-pay | Admitting: Internal Medicine

## 2019-08-19 ENCOUNTER — Other Ambulatory Visit: Payer: Self-pay | Admitting: Cardiology

## 2019-09-17 ENCOUNTER — Encounter: Payer: Self-pay | Admitting: Cardiology

## 2019-10-07 ENCOUNTER — Other Ambulatory Visit: Payer: Self-pay | Admitting: Internal Medicine

## 2019-10-15 ENCOUNTER — Other Ambulatory Visit: Payer: Self-pay

## 2019-10-15 MED ORDER — LEVOTHYROXINE SODIUM 100 MCG PO TABS: ORAL_TABLET | ORAL | 1 refills | Status: DC

## 2019-11-12 ENCOUNTER — Ambulatory Visit (INDEPENDENT_AMBULATORY_CARE_PROVIDER_SITE_OTHER): Payer: 59 | Admitting: Internal Medicine

## 2019-11-12 ENCOUNTER — Other Ambulatory Visit: Payer: Self-pay

## 2019-11-12 ENCOUNTER — Encounter: Payer: Self-pay | Admitting: Internal Medicine

## 2019-11-12 VITALS — BP 134/84 | HR 57 | Temp 98.8°F | Ht 65.0 in | Wt 181.0 lb

## 2019-11-12 DIAGNOSIS — I10 Essential (primary) hypertension: Secondary | ICD-10-CM

## 2019-11-12 DIAGNOSIS — Z0000 Encounter for general adult medical examination without abnormal findings: Secondary | ICD-10-CM | POA: Diagnosis not present

## 2019-11-12 DIAGNOSIS — E785 Hyperlipidemia, unspecified: Secondary | ICD-10-CM | POA: Diagnosis not present

## 2019-11-12 DIAGNOSIS — Z87891 Personal history of nicotine dependence: Secondary | ICD-10-CM | POA: Diagnosis not present

## 2019-11-12 DIAGNOSIS — E039 Hypothyroidism, unspecified: Secondary | ICD-10-CM

## 2019-11-12 DIAGNOSIS — R7303 Prediabetes: Secondary | ICD-10-CM

## 2019-11-12 NOTE — Patient Instructions (Addendum)
Www.vaccine.gov to put in your zip code to find a vaccine.  Health Maintenance, Female Adopting a healthy lifestyle and getting preventive care are important in promoting health and wellness. Ask your health care provider about:  The right schedule for you to have regular tests and exams.  Things you can do on your own to prevent diseases and keep yourself healthy. What should I know about diet, weight, and exercise? Eat a healthy diet   Eat a diet that includes plenty of vegetables, fruits, low-fat dairy products, and lean protein.  Do not eat a lot of foods that are high in solid fats, added sugars, or sodium. Maintain a healthy weight Body mass index (BMI) is used to identify weight problems. It estimates body fat based on height and weight. Your health care provider can help determine your BMI and help you achieve or maintain a healthy weight. Get regular exercise Get regular exercise. This is one of the most important things you can do for your health. Most adults should:  Exercise for at least 150 minutes each week. The exercise should increase your heart rate and make you sweat (moderate-intensity exercise).  Do strengthening exercises at least twice a week. This is in addition to the moderate-intensity exercise.  Spend less time sitting. Even light physical activity can be beneficial. Watch cholesterol and blood lipids Have your blood tested for lipids and cholesterol at 64 years of age, then have this test every 5 years. Have your cholesterol levels checked more often if:  Your lipid or cholesterol levels are high.  You are older than 64 years of age.  You are at high risk for heart disease. What should I know about cancer screening? Depending on your health history and family history, you may need to have cancer screening at various ages. This may include screening for:  Breast cancer.  Cervical cancer.  Colorectal cancer.  Skin cancer.  Lung cancer. What  should I know about heart disease, diabetes, and high blood pressure? Blood pressure and heart disease  High blood pressure causes heart disease and increases the risk of stroke. This is more likely to develop in people who have high blood pressure readings, are of African descent, or are overweight.  Have your blood pressure checked: ? Every 3-5 years if you are 3-44 years of age. ? Every year if you are 61 years old or older. Diabetes Have regular diabetes screenings. This checks your fasting blood sugar level. Have the screening done:  Once every three years after age 40 if you are at a normal weight and have a low risk for diabetes.  More often and at a younger age if you are overweight or have a high risk for diabetes. What should I know about preventing infection? Hepatitis B If you have a higher risk for hepatitis B, you should be screened for this virus. Talk with your health care provider to find out if you are at risk for hepatitis B infection. Hepatitis C Testing is recommended for:  Everyone born from [birth year] through 1965.  Anyone with known risk factors for hepatitis C. Sexually transmitted infections (STIs)  Get screened for STIs, including gonorrhea and chlamydia, if: ? You are sexually active and are younger than 64 years of age. ? You are older than 64 years of age and your health care provider tells you that you are at risk for this type of infection. ? Your sexual activity has changed since you were last screened, and you are at  increased risk for chlamydia or gonorrhea. Ask your health care provider if you are at risk.  Ask your health care provider about whether you are at high risk for HIV. Your health care provider may recommend a prescription medicine to help prevent HIV infection. If you choose to take medicine to prevent HIV, you should first get tested for HIV. You should then be tested every 3 months for as long as you are taking the medicine. Pregnancy  If  you are about to stop having your period (premenopausal) and you may become pregnant, seek counseling before you get pregnant.  Take 400 to 800 micrograms (mcg) of folic acid every day if you become pregnant.  Ask for birth control (contraception) if you want to prevent pregnancy. Osteoporosis and menopause Osteoporosis is a disease in which the bones lose minerals and strength with aging. This can result in bone fractures. If you are 36 years old or older, or if you are at risk for osteoporosis and fractures, ask your health care provider if you should:  Be screened for bone loss.  Take a calcium or vitamin D supplement to lower your risk of fractures.  Be given hormone replacement therapy (HRT) to treat symptoms of menopause. Follow these instructions at home: Lifestyle  Do not use any products that contain nicotine or tobacco, such as cigarettes, e-cigarettes, and chewing tobacco. If you need help quitting, ask your health care provider.  Do not use street drugs.  Do not share needles.  Ask your health care provider for help if you need support or information about quitting drugs. Alcohol use  Do not drink alcohol if: ? Your health care provider tells you not to drink. ? You are pregnant, may be pregnant, or are planning to become pregnant.  If you drink alcohol: ? Limit how much you use to 0-1 drink a day. ? Limit intake if you are breastfeeding.  Be aware of how much alcohol is in your drink. In the [place] one drink equals one 12 oz bottle of beer (355 mL), one 5 oz glass of wine (148 mL), or one 1 oz glass of hard liquor (44 mL). General instructions  Schedule regular health, dental, and eye exams.  Stay current with your vaccines.  Tell your health care provider if: ? You often feel depressed. ? You have ever been abused or do not feel safe at home. Summary  Adopting a healthy lifestyle and getting preventive care are important in promoting health and  wellness.  Follow your health care provider's instructions about healthy diet, exercising, and getting tested or screened for diseases.  Follow your health care provider's instructions on monitoring your cholesterol and blood pressure. This information is not intended to replace advice given to you by your health care provider. Make sure you discuss any questions you have with your health care provider. Document Revised: 04/10/2018 Document Reviewed: 04/10/2018 Elsevier Patient Education  2020 [employer].

## 2019-11-12 NOTE — Progress Notes (Signed)
   Subjective:   Patient ID: Deborah Stewart, female    DOB: 05/20/1955, 64 y.o.   MRN: [id number]  HPI The patient is a 64 YO female coming in for physical.   PMH, Anthony, social history reviewed and updated  Review of Systems  Constitutional: Negative.   HENT: Negative.   Eyes: Negative.   Respiratory: Negative for cough, chest tightness and shortness of breath.   Cardiovascular: Negative for chest pain, palpitations and leg swelling.  Gastrointestinal: Negative for abdominal distention, abdominal pain, constipation, diarrhea, nausea and vomiting.  Musculoskeletal: Negative.   Skin: Negative.   Neurological: Negative.   Psychiatric/Behavioral: Negative.     Objective:  Physical Exam Constitutional:      Appearance: She is well-developed.  HENT:     Head: Normocephalic and atraumatic.  Cardiovascular:     Rate and Rhythm: Normal rate and regular rhythm.  Pulmonary:     Effort: Pulmonary effort is normal. No respiratory distress.     Breath sounds: Normal breath sounds. No wheezing or rales.  Abdominal:     General: Bowel sounds are normal. There is no distension.     Palpations: Abdomen is soft.     Tenderness: There is no abdominal tenderness. There is no rebound.  Musculoskeletal:     Cervical back: Normal range of motion.  Skin:    General: Skin is warm and dry.  Neurological:     Mental Status: She is alert and oriented to person, place, and time.     Coordination: Coordination normal.     Vitals:   11/12/19 0903  BP: 134/84  Pulse: (!) 57  Temp: 98.8 F (37.1 C)  TempSrc: Oral  SpO2: 98%  Weight: 181 lb (82.1 kg)  Height: 5\' 5"  (1.651 m)    This visit occurred during the SARS-CoV-2 public health emergency.  Safety protocols were in place, including screening questions prior to the visit, additional usage of staff PPE, and extensive cleaning of exam room while observing appropriate contact time as indicated for disinfecting solutions.   Assessment &  Plan:

## 2019-11-13 NOTE — Assessment & Plan Note (Signed)
Recent lipid panel at goal on lipitor 80 mg daily. No adjustment today.

## 2019-11-13 NOTE — Assessment & Plan Note (Signed)
Recent TSH at goal on synthroid 100 mcg daily. Refill as needed.

## 2019-11-13 NOTE — Assessment & Plan Note (Signed)
BP at goal on metoprolol. Labs and HR normal recently.

## 2019-11-13 NOTE — Assessment & Plan Note (Signed)
Recent HgA1c without progression and advised to work on exercise and dietary management.

## 2019-11-13 NOTE — Assessment & Plan Note (Signed)
Had quit then resumed briefly now taking chantix and quit again. She is encouraged to stay lifelong cessation and she wants that goal.

## 2019-11-13 NOTE — Assessment & Plan Note (Signed)
Flu shot yearly. Covid-19 counseling given. Shingrix counseled declines for now. Tetanus up to date. Colonoscopy plans to get done soon. Mammogram plans to get done soon, pap smear not indicated. Counseled about sun safety and mole surveillance. Counseled about the dangers of distracted driving. Given 10 year screening recommendations.

## 2019-12-07 ENCOUNTER — Other Ambulatory Visit: Payer: Self-pay | Admitting: Internal Medicine

## 2019-12-17 ENCOUNTER — Telehealth: Payer: Self-pay

## 2019-12-17 ENCOUNTER — Ambulatory Visit (INDEPENDENT_AMBULATORY_CARE_PROVIDER_SITE_OTHER): Payer: 59 | Admitting: Gastroenterology

## 2019-12-17 ENCOUNTER — Encounter: Payer: Self-pay | Admitting: Gastroenterology

## 2019-12-17 VITALS — BP 146/88 | HR 84 | Ht 65.0 in | Wt 184.0 lb

## 2019-12-17 DIAGNOSIS — Z01818 Encounter for other preprocedural examination: Secondary | ICD-10-CM | POA: Diagnosis not present

## 2019-12-17 DIAGNOSIS — Z7902 Long term (current) use of antithrombotics/antiplatelets: Secondary | ICD-10-CM | POA: Diagnosis not present

## 2019-12-17 DIAGNOSIS — Z86010 Personal history of colonic polyps: Secondary | ICD-10-CM | POA: Diagnosis not present

## 2019-12-17 MED ORDER — NA SULFATE-K SULFATE-MG SULF 17.5-3.13-1.6 GM/177ML PO SOLN: 1.0000 | Freq: Once | ORAL | 0 refills | Status: AC

## 2019-12-17 NOTE — Progress Notes (Signed)
    History of Present Illness: This is a 64 year old female IBS, SCAD, history of adenomatous colon polyps.  She discontinued Lialda and hyoscyamine for treatment of scad and IBS as her symptoms have been inactive.  She returns today primarily to schedule colonoscopy for adenomatous colon polyp surveillance.  She is overdue for colonoscopy.  No active GI complaints. Denies weight loss, abdominal pain, constipation, diarrhea, change in stool caliber, melena, hematochezia, nausea, vomiting, dysphagia, reflux symptoms, chest pain.   Current Medications, Allergies, Past Medical History, Past Surgical History, Family History and Social History were reviewed in [employer] record.  Physical Exam: General: Well developed, well nourished, no acute distress Head: Normocephalic and atraumatic Eyes:  sclerae anicteric, EOMI Ears: Normal auditory acuity Mouth: Not examined, mask on during Covid-19 pandemic Lungs: Clear throughout to auscultation Heart: Regular rate and rhythm; no murmurs, rubs or bruits Abdomen: Soft, non tender and non distended. No masses, hepatosplenomegaly or hernias noted. Normal Bowel sounds Rectal: Deferred to colonoscopy Musculoskeletal: Symmetrical with no gross deformities  Pulses:  Normal pulses noted Extremities: No clubbing, cyanosis, edema or deformities noted Neurological: Alert oriented x 4, grossly nonfocal Psychological:  Alert and cooperative. Normal mood and affect   Assessment and Recommendations:  1.  Personal history of adenomatous colon polyps.  She is well overdue for surveillance colonoscopy and she would like to proceed.  Schedule colonoscopy. The risks (including bleeding, perforation, infection, missed lesions, medication reactions and possible hospitalization or surgery if complications occur), benefits, and alternatives to colonoscopy with possible biopsy and possible polypectomy were discussed with the patient and they consent to  proceed.   2.  History of severe left colon diverticulosis with SCAD.  Currently asymptomatic off 5-ASA medications. Observe course.   3.  History of IBS symptoms currently inactive.  Continue to avoid foods that trigger symptoms.  May resume an antispasmodic if needed.  4.  RCA stent on Plavix. Hold Plavix 5 days before procedure - will instruct when and how to resume after procedure. Low but real risk of cardiovascular event such as heart attack, stroke, embolism, thrombosis or ischemia/infarct of other organs off Plavix explained and need to seek urgent help if this occurs. The patient consents to proceed. Will communicate by phone or EMR with patient's prescribing provider to confirm that holding Plavix is reasonable in this case.

## 2019-12-17 NOTE — Telephone Encounter (Signed)
Deborah Stewart 64 year old female is requesting a colonoscopy.  She was last seen by you in clinic on 07/10/2019.  During that time she had not had any further episodes of chest discomfort.  She wished to defer stress testing at that time.  May her Plavix be held for her upcoming procedure?  Her PMH includes essential hypertension, coronary artery disease, hypothyroidism, chronic back pain, anxiety, dyslipidemia, vitamin D deficiency, and right foot pain.  Please direct response to [facility] preop pool.  Thank you for your help.  Deborah Stewart. Deborah Bastedo NP-C    12/17/2019, 12:08 PM [address] Office [phone] Fax [phone]

## 2019-12-17 NOTE — Patient Instructions (Signed)
You have been scheduled for a colonoscopy. Please follow written instructions given to you at your visit today.  Please pick up your prep supplies at the pharmacy within the next 1-3 days. If you use inhalers (even only as needed), please bring them with you on the day of your procedure.  Thank you for choosing me and [facility].  Malcolm T. Stark, Jr., MD., FACG  

## 2019-12-17 NOTE — Telephone Encounter (Signed)
 [facility] Pre-operative Risk Assessment     Request for surgical clearance:     Endoscopy Procedure  What type of surgery is being performed?     Colonoscopy  When is this surgery scheduled?     12/24/19  What type of clearance is required ?   Pharmacy  Are there any medications that need to be held prior to surgery and how long? Plavix x 5 days  Practice name and name of physician performing surgery?      [facility]  What is your office phone and fax number?      Phone- [phone]  [phone]  Anesthesia type (None, local, MAC, general) ?       MAC

## 2019-12-19 NOTE — Telephone Encounter (Signed)
Have not received response from Cardiology regarding patient's Plavix. Will have to reschedule procedure until we receive clearance from Cardiology. Left message for patient to return my call.

## 2019-12-19 NOTE — Telephone Encounter (Signed)
Please answer as soon as possible. Procedure is scheduled for 12/24/19.

## 2019-12-22 ENCOUNTER — Ambulatory Visit (INDEPENDENT_AMBULATORY_CARE_PROVIDER_SITE_OTHER): Payer: 59

## 2019-12-22 ENCOUNTER — Other Ambulatory Visit: Payer: Self-pay

## 2019-12-22 DIAGNOSIS — Z1159 Encounter for screening for other viral diseases: Secondary | ICD-10-CM

## 2019-12-22 NOTE — Telephone Encounter (Signed)
The patient can stop Plavix.  Please have her start coated aspirin 81 mg daily from today/now!  Once the procedure is done and the doctor has okayed she can go back to her current medications.  Thank you

## 2019-12-22 NOTE — Telephone Encounter (Signed)
Patient returned my call and states she has already stopped Plavix starting on Saturday. Informed patient that I have not got approval from Cardiology regarding her Plavix and needed to wait on our response before stopping. Patient has already went for covid test today and states she does not want to reschedule. Harrel Lemon, CMA called Dr. Geraldo Pitter to discuss his patient and to see if we could obtain Plavix clearance prior to Colonoscopy since patient has already stopped taking her Plavix. Dr. Geraldo Pitter states as long as patient has not had a cardiac intervention in the last 6 months then patient can proceed with Colonoscopy on 12/24/19 and can continue to hold Plavix 5 days prior to procedure. Also he recommended patient start enteric coated aspirin while she is not taking her Plavix. Informed patient of Dr. Enos Fling recommendations and she verbalized understanding. Patient confirmed she has not had a cardiac stent in the last 6 months and the last cardiac intervention was from 2018. Will route message again to Dr. Geraldo Pitter to get approval documented in chart.

## 2019-12-22 NOTE — Telephone Encounter (Signed)
Called patient with no answer and voicemail box is full. Could not leave a message. Will keep calling patient to reschedule procedure for 12/24/19.

## 2019-12-22 NOTE — Telephone Encounter (Signed)
   Primary Cardiologist: Dr Geraldo Pitter  Chart reviewed as part of pre-operative protocol coverage. Given past medical history and time since last visit, based on ACC/AHA guidelines, Deborah Stewart would be at acceptable risk for the planned procedure without further cardiovascular testing.   OK to stop Plavix for her procedure. While off Plavix she should take aspirin 81 mg daily.  Once Plavix resumed post op she can stop the aspirin.   I will route this recommendation to the requesting party via Epic fax function and remove from pre-op pool.  Please call with questions.  Kerin Ransom, PA-C 12/22/2019, 2:30 PM

## 2019-12-22 NOTE — Telephone Encounter (Signed)
Called patient's emergency contact and her sister-in-law gave me alternative number to try. It was [phone]. The number she gave was disconnected.

## 2019-12-23 LAB — SARS CORONAVIRUS 2 (TAT 6-24 HRS): SARS Coronavirus 2: NEGATIVE

## 2019-12-24 ENCOUNTER — Other Ambulatory Visit: Payer: Self-pay

## 2019-12-24 ENCOUNTER — Ambulatory Visit (AMBULATORY_SURGERY_CENTER): Payer: 59 | Admitting: Gastroenterology

## 2019-12-24 ENCOUNTER — Encounter: Payer: Self-pay | Admitting: Gastroenterology

## 2019-12-24 VITALS — BP 110/76 | HR 73 | Temp 96.9°F | Resp 13 | Ht 64.0 in | Wt 184.0 lb

## 2019-12-24 DIAGNOSIS — D125 Benign neoplasm of sigmoid colon: Secondary | ICD-10-CM

## 2019-12-24 DIAGNOSIS — Z86010 Personal history of colonic polyps: Secondary | ICD-10-CM

## 2019-12-24 DIAGNOSIS — D122 Benign neoplasm of ascending colon: Secondary | ICD-10-CM

## 2019-12-24 DIAGNOSIS — D123 Benign neoplasm of transverse colon: Secondary | ICD-10-CM

## 2019-12-24 DIAGNOSIS — K635 Polyp of colon: Secondary | ICD-10-CM

## 2019-12-24 MED ORDER — SODIUM CHLORIDE 0.9 % IV SOLN: 500.0000 mL | Freq: Once | INTRAVENOUS | Status: DC

## 2019-12-24 MED ORDER — HYOSCYAMINE SULFATE 0.125 MG SL SUBL: 0.1250 mg | SUBLINGUAL_TABLET | SUBLINGUAL | 5 refills | Status: DC | PRN

## 2019-12-24 NOTE — Progress Notes (Signed)
Called to room to assist during endoscopic procedure.  Patient ID and intended procedure confirmed with present staff. Received instructions for my participation in the procedure from the performing physician.  

## 2019-12-24 NOTE — Progress Notes (Signed)
Pt's states no medical or surgical changes since previsit or office visit.  Cw vitals and AW IV.

## 2019-12-24 NOTE — Op Note (Signed)
[facility] Patient Name: Deborah Stewart Procedure Date: 12/24/2019 2:18 PM MRN: [id number] Endoscopist: Ladene Artist , MD Age: 64 Referring MD:  Date of Birth: [birth year] Gender: Female Account #: [id number] Procedure:                Colonoscopy Indications:              Surveillance: Personal history of adenomatous                            polyps on last colonoscopy > 5 years ago Medicines:                Monitored Anesthesia Care Procedure:                Pre-Anesthesia Assessment:                           - Prior to the procedure, a History and Physical                            was performed, and patient medications and                            allergies were reviewed. The patient's tolerance of                            previous anesthesia was also reviewed. The risks                            and benefits of the procedure and the sedation                            options and risks were discussed with the patient.                            All questions were answered, and informed consent                            was obtained. Prior Anticoagulants: The patient has                            taken Plavix (clopidogrel), last dose was 5 days                            prior to procedure. ASA Grade Assessment: II - A                            patient with mild systemic disease. After reviewing                            the risks and benefits, the patient was deemed in                            satisfactory condition to undergo the procedure.  After obtaining informed consent, the colonoscope                            was passed under direct vision. Throughout the                            procedure, the patient's blood pressure, pulse, and                            oxygen saturations were monitored continuously. The                            Colonoscope was introduced through the anus and                             advanced to the the cecum, identified by                            appendiceal orifice and ileocecal valve. The                            ileocecal valve, appendiceal orifice, and rectum                            were photographed. The quality of the bowel                            preparation was adequate after extensive lavage and                            suction. The colonoscopy was somewhat difficult due                            to restricted mobility of the left colon and a                            tortuous colon. Changed to a peds colonoscope to                            traverse the sigmoid colon. The patient tolerated                            the procedure well. Scope In: 2:27:18 PM Scope Out: 2:54:09 PM Scope Withdrawal Time: 0 hours 20 minutes 9 seconds  Total Procedure Duration: 0 hours 26 minutes 51 seconds  Findings:                 The perianal and digital rectal examinations were                            normal.                           Ten sessile polyps were found in the sigmoid colon                            (  7), transverse colon (2) and ascending colon (1).                            The polyps were 6 to 9 mm in size. These polyps                            were removed with a cold snare. Resection and                            retrieval were complete.                           Multiple small-mouthed diverticula were found in                            the left colon. There was narrowing of the colon in                            association with the diverticular opening. There                            was evidence of diverticular spasm.                            Peri-diverticular erythema was seen. There was no                            evidence of diverticular bleeding.                           The exam was otherwise without abnormality on                            direct and retroflexion views. Complications:            No immediate  complications. Estimated blood loss:                            None. Estimated Blood Loss:     Estimated blood loss: none. Impression:               - Ten 6 to 9 mm polyps in the sigmoid colon, in the                            transverse colon and in the ascending colon,                            removed with a cold snare. Resected and retrieved.                           - Severe diverticulosis in the left colon. There                            was narrowing of the colon in association with the  diverticular opening. There was evidence of                            diverticular spasm. Peri-diverticular erythema was                            seen.                           - The examination was otherwise normal on direct                            and retroflexion views. Recommendation:           - Repeat colonoscopy after studies are complete for                            surveillance based on pathology results with a more                            extensive bowel prep.                           - Resume Plavix (clopidogrel) in 2 days at prior                            dose. Refer to managing physician for further                            adjustment of therapy.                           - Patient has a contact number available for                            emergencies. The signs and symptoms of potential                            delayed complications were discussed with the                            patient. Return to normal activities tomorrow.                            Written discharge instructions were provided to the                            patient.                           - Resume previous diet.                           - Continue present medications.                           - Await pathology results. Norberto Sorenson  Sindy Guadeloupe, MD 12/24/2019 3:04:45 PM This report has been signed electronically.

## 2019-12-24 NOTE — Patient Instructions (Signed)
Resume your plavix in 2 days.  Take your new medication as ordered.  It's at your pharmacy.  Read all handouts given to you by your recovery room nurse.  Thank-you for choosing [place] for your healthcare needs today.  YOU HAD AN ENDOSCOPIC PROCEDURE TODAY AT [facility]:   Refer to the procedure report that was given to you for any specific questions about what was found during the examination.  If the procedure report does not answer your questions, please call your gastroenterologist to clarify.  If you requested that your care partner not be given the details of your procedure findings, then the procedure report has been included in a sealed envelope for you to review at your convenience later.  YOU SHOULD EXPECT: Some feelings of bloating in the abdomen. Passage of more gas than usual.  Walking can help get rid of the air that was put into your GI tract during the procedure and reduce the bloating. If you had a lower endoscopy (such as a colonoscopy or flexible sigmoidoscopy) you may notice spotting of blood in your stool or on the toilet paper. If you underwent a bowel prep for your procedure, you may not have a normal bowel movement for a few days.  Please Note:  You might notice some irritation and congestion in your nose or some drainage.  This is from the oxygen used during your procedure.  There is no need for concern and it should clear up in a day or so.  SYMPTOMS TO REPORT IMMEDIATELY:   Following lower endoscopy (colonoscopy or flexible sigmoidoscopy):  Excessive amounts of blood in the stool  Significant tenderness or worsening of abdominal pains  Swelling of the abdomen that is new, acute  Fever of 100F or higher   For urgent or emergent issues, a gastroenterologist can be reached at any hour by calling [phone]. Do not use MyChart messaging for urgent concerns.    DIET:  We do recommend a small meal at first, but then you may proceed to your regular diet.   Drink plenty of fluids but you should avoid alcoholic beverages for 24 hours. Try to increase the fiber in your diet, and drink plenty of water. . ACTIVITY:  You should plan to take it easy for the rest of today and you should NOT DRIVE or use heavy machinery until tomorrow (because of the sedation medicines used during the test).    FOLLOW UP: Our staff will call the number listed on your records 48-72 hours following your procedure to check on you and address any questions or concerns that you may have regarding the information given to you following your procedure. If we do not reach you, we will leave a message.  We will attempt to reach you two times.  During this call, we will ask if you have developed any symptoms of COVID 19. If you develop any symptoms (ie: fever, flu-like symptoms, shortness of breath, cough etc.) before then, please call [phone].  If you test positive for Covid 19 in the 2 weeks post procedure, please call and report this information to [place].    If any biopsies were taken you will be contacted by phone or by letter within the next 1-3 weeks.  Please call us at [phone] if you have not heard about the biopsies in 3 weeks.    SIGNATURES/CONFIDENTIALITY: You and/or your care partner have signed paperwork which will be entered into your electronic medical record.  These signatures  attest to the fact that that the information above on your After Visit Summary has been reviewed and is understood.  Full responsibility of the confidentiality of this discharge information lies with you and/or your care-partner.

## 2019-12-24 NOTE — Progress Notes (Signed)
Report to PACU, RN, vss, BBS= Clear.  

## 2019-12-26 ENCOUNTER — Telehealth: Payer: Self-pay

## 2019-12-26 NOTE — Telephone Encounter (Signed)
  Follow up Call-  Call back number 12/24/2019  Post procedure Call Back phone  # [phone]  Permission to leave phone message Yes  Some recent data might be hidden     Patient questions:  Do you have a fever, pain , or abdominal swelling? No. Pain Score  0 *  Have you tolerated food without any problems? Yes.    Have you been able to return to your normal activities? Yes.    Do you have any questions about your discharge instructions: Diet   No. Medications  No. Follow up visit  No.  Do you have questions or concerns about your Care? No.  Actions: * If pain score is 4 or above: No action needed, pain <4.  1. Have you developed a fever since your procedure? no  2.   Have you had an respiratory symptoms (SOB or cough) since your procedure? no  3.   Have you tested positive for COVID 19 since your procedure no  4.   Have you had any family members/close contacts diagnosed with the COVID 19 since your procedure?  no   If yes to any of these questions please route to Joylene John, RN and Joella Prince, RN

## 2020-01-01 ENCOUNTER — Encounter: Payer: Self-pay | Admitting: Gastroenterology

## 2020-01-13 ENCOUNTER — Other Ambulatory Visit: Payer: Self-pay | Admitting: Cardiology

## 2020-01-13 ENCOUNTER — Other Ambulatory Visit: Payer: Self-pay

## 2020-01-13 DIAGNOSIS — N809 Endometriosis, unspecified: Secondary | ICD-10-CM | POA: Insufficient documentation

## 2020-01-13 DIAGNOSIS — K5190 Ulcerative colitis, unspecified, without complications: Secondary | ICD-10-CM | POA: Insufficient documentation

## 2020-01-13 DIAGNOSIS — E041 Nontoxic single thyroid nodule: Secondary | ICD-10-CM | POA: Insufficient documentation

## 2020-01-13 DIAGNOSIS — N870 Mild cervical dysplasia: Secondary | ICD-10-CM | POA: Insufficient documentation

## 2020-01-13 DIAGNOSIS — N63 Unspecified lump in unspecified breast: Secondary | ICD-10-CM | POA: Insufficient documentation

## 2020-01-13 DIAGNOSIS — R102 Pelvic and perineal pain: Secondary | ICD-10-CM | POA: Insufficient documentation

## 2020-01-13 DIAGNOSIS — I10 Essential (primary) hypertension: Secondary | ICD-10-CM | POA: Insufficient documentation

## 2020-01-13 DIAGNOSIS — M5030 Other cervical disc degeneration, unspecified cervical region: Secondary | ICD-10-CM | POA: Insufficient documentation

## 2020-01-13 DIAGNOSIS — N946 Dysmenorrhea, unspecified: Secondary | ICD-10-CM | POA: Insufficient documentation

## 2020-01-13 DIAGNOSIS — Z8619 Personal history of other infectious and parasitic diseases: Secondary | ICD-10-CM | POA: Insufficient documentation

## 2020-01-13 DIAGNOSIS — M5136 Other intervertebral disc degeneration, lumbar region: Secondary | ICD-10-CM | POA: Insufficient documentation

## 2020-01-13 DIAGNOSIS — E785 Hyperlipidemia, unspecified: Secondary | ICD-10-CM | POA: Insufficient documentation

## 2020-01-13 DIAGNOSIS — M1990 Unspecified osteoarthritis, unspecified site: Secondary | ICD-10-CM | POA: Insufficient documentation

## 2020-01-13 DIAGNOSIS — I2510 Atherosclerotic heart disease of native coronary artery without angina pectoris: Secondary | ICD-10-CM | POA: Insufficient documentation

## 2020-01-14 ENCOUNTER — Encounter: Payer: Self-pay | Admitting: Cardiology

## 2020-01-14 ENCOUNTER — Other Ambulatory Visit: Payer: Self-pay

## 2020-01-14 ENCOUNTER — Ambulatory Visit (INDEPENDENT_AMBULATORY_CARE_PROVIDER_SITE_OTHER): Payer: 59 | Admitting: Cardiology

## 2020-01-14 ENCOUNTER — Ambulatory Visit: Payer: 59 | Admitting: Cardiology

## 2020-01-14 VITALS — BP 128/72 | HR 62 | Ht 65.0 in | Wt 184.0 lb

## 2020-01-14 DIAGNOSIS — I10 Essential (primary) hypertension: Secondary | ICD-10-CM | POA: Diagnosis not present

## 2020-01-14 DIAGNOSIS — E782 Mixed hyperlipidemia: Secondary | ICD-10-CM

## 2020-01-14 DIAGNOSIS — I2510 Atherosclerotic heart disease of native coronary artery without angina pectoris: Secondary | ICD-10-CM

## 2020-01-14 DIAGNOSIS — E039 Hypothyroidism, unspecified: Secondary | ICD-10-CM

## 2020-01-14 NOTE — Progress Notes (Signed)
Cardiology Office Note:    Date:  01/14/2020   ID:  Deborah Stewart, DOB [birth year], MRN [id number]  PCP:  Hoyt Koch, MD  Cardiologist:  Jenean Lindau, MD   Referring MD: Hoyt Koch, *    ASSESSMENT:    1. Coronary artery disease involving native coronary artery of native heart without angina pectoris   2. Essential hypertension   3. Mixed hyperlipidemia    PLAN:    In order of problems listed above:  1. Coronary artery disease: Secondary prevention stressed with the patient. Importance of compliance with diet medication stressed and she vocalized understanding. She does not exercise much because of orthopedic and podiatry issues. She does her best and she is asymptomatic with this 2. Essential hypertension: Blood pressure stable and diet was emphasized including salt intake issues 3. Mixed dyslipidemia: Lipids were reviewed from previous evaluation. We will recheck them today. Diet was emphasized. 4. Hemoglobin A1c is elevated last time. We will check this and if elevated will refer her to a primary care provider for management. I emphasized diet. 5. Patient will be seen in follow-up appointment in 6 months or earlier if the patient has any concerns    Medication Adjustments/Labs and Tests Ordered: Current medicines are reviewed at length with the patient today.  Concerns regarding medicines are outlined above.  No orders of the defined types were placed in this encounter.  No orders of the defined types were placed in this encounter.    No chief complaint on file.    History of Present Illness:    Deborah Stewart is a 64 y.o. female. Patient has past medical history of coronary artery disease, essential hypertension dyslipidemia. Hemoglobin A1c was elevated in the past. She denies any problems at this time and takes care of activities of daily living. No chest pain orthopnea or PND. At the time of my evaluation, the patient is alert awake  oriented and in no distress. She leads a fairly sedentary lifestyle because of poor dietary issues.  Past Medical History:  Diagnosis Date  . Abnormal Pap smear   . Anxiety   . Arthritis    "knees" (02/06/2017)  . ASCUS on Pap smear   . Atrophic vaginitis 09/25/2017  . Breast mass    "I have lumpy breasts" (02/06/2017)  . CAD (coronary artery disease), native coronary artery 02/06/2017  . Chronic back pain 07/08/2012   Pt has had lower back pain from a herniated disc for years, she is s/p cervical diskectomy.   . Chronic left shoulder pain 07/15/2015  . Chronic pain of left knee 07/15/2015  . Coronary artery disease    10/18 PCI/DESx1 to mRCA.   . DDD (degenerative disc disease), cervical    s/p diskectomy   . DDD (degenerative disc disease), lumbar    by MRI  . Diverticulosis of colon (without mention of hemorrhage)   . Dyslipidemia 02/01/2017  . Dysmenorrhea   . Dyspareunia   . Dysplasia of cervix, low grade (CIN 1)   . Endometriosis   . Essential hypertension 02/01/2017  . Former smoker 02/01/2017  . Health care maintenance 07/08/2012   Pt is being followed by a gastroenterologist for her ulcerative colitis they manage her colonoscopies.  Pt has an appointment with her OB/Gyn in July.   Marland Kitchen 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  . History of chicken pox   . History of measles   . Hyperlipidemia   . Hypertension   . Hypothyroidism 11/10/2008  Qualifier: Diagnosis of  By: Sharlett Iles MD Cline Cools R  Chronic problem s/p partial thyroidectomy    . Intermittent claudication ([place]) 07/10/2019  . Left arm pain 01/29/2019  . Lesion of vulva 09/25/2017  . Osteoporosis 08/28/2016  . Pelvic pain in female   . Personal history of colonic polyps 06/2010   hyperplastic, tubular adenoma  . Pre-diabetes 11/04/2018  . Right foot pain 11/18/2018  . 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 Snoring disorder 01/14/2013  . Thyroid nodule   . Ulcerative colitis ([place])   . Unspecified hypothyroidism   .  Vitamin D deficiency 08/30/2016    Past Surgical History:  Procedure Laterality Date  . ANTERIOR CERVICAL DECOMP/DISCECTOMY FUSION  2004   Dr. Trenton Gammon  . BACK SURGERY    . BREAST BIOPSY Bilateral   . COLONOSCOPY    . CORONARY ANGIOPLASTY WITH STENT PLACEMENT  02/06/2017  . CORONARY STENT INTERVENTION N/A 02/06/2017   Procedure: CORONARY STENT INTERVENTION;  Surgeon: Belva Crome, MD;  Location: [facility];  Service: Cardiovascular;  Laterality: N/A;  . FOOT SURGERY Right 09/2011    Dr. Ladora Daniel, DPO, removal of "mass"   . FOOT SURGERY Left 1970s X 1  . FOOT SURGERY Right 2000s "multiple"   "botched 1st time; tried to correct several times"  . LEFT HEART CATH AND CORONARY ANGIOGRAPHY N/A 02/06/2017   Procedure: LEFT HEART CATH AND CORONARY ANGIOGRAPHY;  Surgeon: Belva Crome, MD;  Location: [facility];  Service: Cardiovascular;  Laterality: N/A;  . THYROIDECTOMY, PARTIAL  06/2009   "removed goiter"  . TOTAL ABDOMINAL HYSTERECTOMY     TOTAL ABDOMINAL HYSTERECTOMY W/ BILATERAL SALPINGOOPHORECTOMY     Current Medications: Current Meds  Medication Sig  . atorvastatin (LIPITOR) 80 MG tablet TAKE 1 TABLET BY MOUTH DAILY AT 6 PM.  . clopidogrel (PLAVIX) 75 MG tablet TAKE 1 TABLET (75 MG TOTAL) BY MOUTH DAILY WITH BREAKFAST.  . cyclobenzaprine (FLEXERIL) 10 MG tablet Take 1 tablet (10 mg total) by mouth every 8 (eight) hours as needed.  . hyoscyamine (LEVSIN SL) 0.125 MG SL tablet Place 1 tablet (0.125 mg total) under the tongue every 4 (four) hours as needed (take 1-2 tablets q4hs as needed).  Marland Kitchen levothyroxine (SYNTHROID) 100 MCG tablet TAKE 1 TABLET BY MOUTH DAILY BEFORE BREAKFAST  . metoprolol succinate (TOPROL-XL) 25 MG 24 hr tablet TAKE 1 TABLET BY MOUTH EVERY DAY  . pantoprazole (PROTONIX) 40 MG tablet TAKE 1 TABLET BY MOUTH EVERY DAY     Allergies:   Codeine   Social History   Socioeconomic History  . Marital status: Divorced    Spouse name: Not on file   . Number of children: 1  . Years of education: 79  . Highest education level: Not on file  Occupational History  . Occupation: filed Nurse, mental health: [employer]  Tobacco Use  . Smoking status: Former Smoker    Packs/day: 0.50    Years: 45.00    Pack years: 22.50    Types: Cigarettes    Start date: 12/31/2018    Quit date: 05/29/2019    Years since quitting: 0.6  . Smokeless tobacco: Never Used  . Tobacco comment: taking Chantix  Vaping Use  . Vaping Use: Never used  Substance and Sexual Activity  . Alcohol use: Yes    Alcohol/week: 5.0 standard drinks    Types: 5 Standard drinks or equivalent per week    Comment: 02/06/2017 "might have 1-4 mixed  drinks/month"  . Drug use: No  . Sexual activity: Not on file  Other Topics Concern  . Not on file  Social History Narrative   [employer], [employer] -BS social work. Married 1980 - 38 yrs/divorced. 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 Dating - long term relationship/[place]. Unprotected. No history of physical or sexual abuse.    Social Determinants of Health   Financial Resource Strain:   . Difficulty of Paying Living Expenses: Not on file  Food Insecurity:   . Worried About Charity fundraiser in the Last Year: Not on file  . Ran Out of Food in the Last Year: Not on file  Transportation Needs:   . Lack of Transportation (Medical): Not on file  . Lack of Transportation (Non-Medical): Not on file  Physical Activity:   . Days of Exercise per Week: Not on file  . Minutes of Exercise per Session: Not on file  Stress:   . Feeling of Stress : Not on file  Social Connections:   . Frequency of Communication with Friends and Family: Not on file  . Frequency of Social Gatherings with Friends and Family: Not on file  . Attends Religious Services: Not on file  . Active Member of Clubs or Organizations: Not on file  . Attends Archivist Meetings: Not on file  . Marital Status: Not on file      Family History: The patient's family history includes Breast cancer in her sister and another family member; Diabetes in her brother, father, mother, and another family member; Heart disease in her brother, father, and another family member; Hyperlipidemia in her brother; Hypertension in her brother and brother; Lung cancer in an other family member. There is no history of Colon cancer, Stomach cancer, Rectal cancer, or Esophageal cancer.  ROS:   Please see the history of present illness.    All other systems reviewed and are negative.  EKGs/Labs/Other Studies Reviewed:    The following studies were reviewed today: I discussed my findings with the patient at extensive length   Recent Labs: 06/12/2019: ALT 20; BUN 10; Creatinine, Ser 0.75; Hemoglobin 15.2; Platelets 332; Potassium 4.6; Sodium 142; TSH 4.950  Recent Lipid Panel    Component Value Date/Time   CHOL 139 06/12/2019 1114   TRIG 125 06/12/2019 1114   HDL 39 (L) 06/12/2019 1114   CHOLHDL 3.6 06/12/2019 1114   CHOLHDL 3 05/06/2018 1105   VLDL 21.2 05/06/2018 1105   LDLCALC 78 06/12/2019 1114   LDLDIRECT 112.1 10/11/2012 1554    Physical Exam:    VS:  BP 128/72   Pulse 62   Ht 5\' 5"  (1.651 m)   Wt 184 lb (83.5 kg)   SpO2 95%   BMI 30.62 kg/m     Wt Readings from Last 3 Encounters:  01/14/20 184 lb (83.5 kg)  12/24/19 184 lb (83.5 kg)  12/17/19 184 lb (83.5 kg)     GEN: Patient is in no acute distress HEENT: Normal NECK: No JVD; No carotid bruits LYMPHATICS: No lymphadenopathy CARDIAC: Hear sounds regular, 2/6 systolic murmur at the apex. RESPIRATORY:  Clear to auscultation without rales, wheezing or rhonchi  ABDOMEN: Soft, non-tender, non-distended MUSCULOSKELETAL:  No edema; No deformity  SKIN: Warm and dry NEUROLOGIC:  Alert and oriented x 3 PSYCHIATRIC:  Normal affect   Signed, Jenean Lindau, MD  01/14/2020 9:45 AM    [place]

## 2020-01-14 NOTE — Patient Instructions (Signed)

## 2020-01-15 LAB — CBC WITH DIFFERENTIAL/PLATELET
Basophils Absolute: 0 10*3/uL (ref 0.0–0.2)
Basos: 0 %
EOS (ABSOLUTE): 0.2 10*3/uL (ref 0.0–0.4)
Eos: 2 %
Hematocrit: 45.2 % (ref 34.0–46.6)
Hemoglobin: 15.4 g/dL (ref 11.1–15.9)
Immature Grans (Abs): 0 10*3/uL (ref 0.0–0.1)
Immature Granulocytes: 0 %
Lymphocytes Absolute: 3.1 10*3/uL (ref 0.7–3.1)
Lymphs: 35 %
MCH: 30.6 pg (ref 26.6–33.0)
MCHC: 34.1 g/dL (ref 31.5–35.7)
MCV: 90 fL (ref 79–97)
Monocytes Absolute: 0.3 10*3/uL (ref 0.1–0.9)
Monocytes: 4 %
Neutrophils Absolute: 5.3 10*3/uL (ref 1.4–7.0)
Neutrophils: 59 %
Platelets: 321 10*3/uL (ref 150–450)
RBC: 5.04 x10E6/uL (ref 3.77–5.28)
RDW: 13.2 % (ref 11.7–15.4)
WBC: 9 10*3/uL (ref 3.4–10.8)

## 2020-01-15 LAB — LIPID PANEL
Chol/HDL Ratio: 3.7 ratio (ref 0.0–4.4)
Cholesterol, Total: 148 mg/dL (ref 100–199)
HDL: 40 mg/dL (ref >39)
LDL Chol Calc (NIH): 84 mg/dL (ref 0–99)
Triglycerides: 138 mg/dL (ref 0–149)
VLDL Cholesterol Cal: 24 mg/dL (ref 5–40)

## 2020-01-15 LAB — BASIC METABOLIC PANEL
BUN/Creatinine Ratio: 11 — ABNORMAL LOW (ref 12–28)
BUN: 9 mg/dL (ref 8–27)
CO2: 26 mmol/L (ref 20–29)
Calcium: 9.4 mg/dL (ref 8.7–10.3)
Chloride: 103 mmol/L (ref 96–106)
Creatinine, Ser: 0.8 mg/dL (ref 0.57–1.00)
GFR calc Af Amer: 91 mL/min/{1.73_m2} (ref >59)
GFR calc non Af Amer: 79 mL/min/{1.73_m2} (ref >59)
Glucose: 80 mg/dL (ref 65–99)
Potassium: 4.4 mmol/L (ref 3.5–5.2)
Sodium: 142 mmol/L (ref 134–144)

## 2020-01-15 LAB — HEMOGLOBIN A1C
Est. average glucose Bld gHb Est-mCnc: 123 mg/dL
Hgb A1c MFr Bld: 5.9 % — ABNORMAL HIGH (ref 4.8–5.6)

## 2020-01-15 LAB — HEPATIC FUNCTION PANEL
ALT: 16 IU/L (ref 0–32)
AST: 21 IU/L (ref 0–40)
Albumin: 4.4 g/dL (ref 3.8–4.8)
Alkaline Phosphatase: 97 IU/L (ref 44–121)
Bilirubin Total: 0.4 mg/dL (ref 0.0–1.2)
Bilirubin, Direct: 0.12 mg/dL (ref 0.00–0.40)
Total Protein: 7 g/dL (ref 6.0–8.5)

## 2020-01-15 LAB — TSH: TSH: 2.56 u[IU]/mL (ref 0.450–4.500)

## 2020-01-21 ENCOUNTER — Other Ambulatory Visit: Payer: Self-pay | Admitting: Internal Medicine

## 2020-05-14 ENCOUNTER — Ambulatory Visit: Payer: 59 | Admitting: Internal Medicine

## 2020-05-14 ENCOUNTER — Other Ambulatory Visit: Payer: Self-pay

## 2020-05-14 NOTE — Progress Notes (Signed)
Patient had to leave for child care.

## 2020-05-20 ENCOUNTER — Ambulatory Visit (INDEPENDENT_AMBULATORY_CARE_PROVIDER_SITE_OTHER): Payer: 59 | Admitting: Internal Medicine

## 2020-05-20 ENCOUNTER — Encounter: Payer: Self-pay | Admitting: Internal Medicine

## 2020-05-20 ENCOUNTER — Other Ambulatory Visit: Payer: Self-pay

## 2020-05-20 VITALS — BP 130/72 | HR 59 | Temp 98.3°F | Resp 18 | Ht 65.0 in | Wt 180.2 lb

## 2020-05-20 DIAGNOSIS — R200 Anesthesia of skin: Secondary | ICD-10-CM | POA: Diagnosis not present

## 2020-05-20 LAB — COMPREHENSIVE METABOLIC PANEL
ALT: 12 U/L (ref 0–35)
AST: 16 U/L (ref 0–37)
Albumin: 4.3 g/dL (ref 3.5–5.2)
Alkaline Phosphatase: 76 U/L (ref 39–117)
BUN: 8 mg/dL (ref 6–23)
CO2: 29 mEq/L (ref 19–32)
Calcium: 9.5 mg/dL (ref 8.4–10.5)
Chloride: 103 mEq/L (ref 96–112)
Creatinine, Ser: 0.8 mg/dL (ref 0.40–1.20)
GFR: 77.93 mL/min (ref >60.00)
Glucose, Bld: 95 mg/dL (ref 70–99)
Potassium: 4.2 mEq/L (ref 3.5–5.1)
Sodium: 139 mEq/L (ref 135–145)
Total Bilirubin: 0.5 mg/dL (ref 0.2–1.2)
Total Protein: 7.3 g/dL (ref 6.0–8.3)

## 2020-05-20 LAB — HEMOGLOBIN A1C: Hgb A1c MFr Bld: 6.3 % (ref 4.6–6.5)

## 2020-05-20 LAB — T4, FREE: Free T4: 0.98 ng/dL (ref 0.60–1.60)

## 2020-05-20 LAB — VITAMIN B12: Vitamin B-12: 381 pg/mL (ref 211–911)

## 2020-05-20 LAB — TSH: TSH: 2.74 u[IU]/mL (ref 0.35–4.50)

## 2020-05-20 LAB — VITAMIN D 25 HYDROXY (VIT D DEFICIENCY, FRACTURES): VITD: 22.51 ng/mL — ABNORMAL LOW (ref 30.00–100.00)

## 2020-05-20 NOTE — Progress Notes (Signed)
   Subjective:   Patient ID: Deborah Stewart, female    DOB: 08/21/1955, 65 y.o.   MRN: [id number]  HPI The patient is a 65 YO female coming in for concerns about numbness in her hands more often. She is working about long term side effects potentially from her medications. Denies missing doses of medications recently. She does have pre-diabetes and has made some changes to diet. She denies increased thirst or urination or blurred vision recently.   Review of Systems  Constitutional: Negative.   HENT: Negative.   Eyes: Negative.   Respiratory: Negative for cough, chest tightness and shortness of breath.   Cardiovascular: Negative for chest pain, palpitations and leg swelling.  Gastrointestinal: Negative for abdominal distention, abdominal pain, constipation, diarrhea, nausea and vomiting.  Musculoskeletal: Negative.   Skin: Negative.   Neurological: Positive for numbness.  Psychiatric/Behavioral: Negative.     Objective:  Physical Exam Constitutional:      Appearance: She is well-developed and well-nourished.  HENT:     Head: Normocephalic and atraumatic.  Eyes:     Extraocular Movements: EOM normal.  Cardiovascular:     Rate and Rhythm: Normal rate and regular rhythm.  Pulmonary:     Effort: Pulmonary effort is normal. No respiratory distress.     Breath sounds: Normal breath sounds. No wheezing or rales.  Abdominal:     General: Bowel sounds are normal. There is no distension.     Palpations: Abdomen is soft.     Tenderness: There is no abdominal tenderness. There is no rebound.  Musculoskeletal:        General: No edema.     Cervical back: Normal range of motion.  Skin:    General: Skin is warm and dry.  Neurological:     Mental Status: She is alert and oriented to person, place, and time.     Coordination: Coordination normal.  Psychiatric:        Mood and Affect: Mood and affect normal.     Vitals:   05/20/20 0842  BP: 130/72  Pulse: (!) 59  Resp: 18   Temp: 98.3 F (36.8 C)  TempSrc: Oral  SpO2: 97%  Weight: 180 lb 3.2 oz (81.7 kg)  Height: 5\' 5"  (1.651 m)    This visit occurred during the SARS-CoV-2 public health emergency.  Safety protocols were in place, including screening questions prior to the visit, additional usage of staff PPE, and extensive cleaning of exam room while observing appropriate contact time as indicated for disinfecting solutions.   Assessment & Plan:

## 2020-05-20 NOTE — Patient Instructions (Signed)
We will check the labs today and call you back about the results.   If the labs are normal we will try the carpal tunnel brace at night time.

## 2020-05-21 DIAGNOSIS — R200 Anesthesia of skin: Secondary | ICD-10-CM

## 2020-05-21 HISTORY — DX: Anesthesia of skin: R20.0

## 2020-05-21 NOTE — Assessment & Plan Note (Signed)
Checking labs to assess vitamin d, vitamin b12, thyroid, HgA1c, CBC, CMP. Adjust as needed. If normal will try carpal tunnel braces. Could be related to neck as she has had cervical problems in the past although she is not having any neck pain.

## 2020-06-11 ENCOUNTER — Other Ambulatory Visit: Payer: Self-pay | Admitting: Gastroenterology

## 2020-06-11 DIAGNOSIS — D122 Benign neoplasm of ascending colon: Secondary | ICD-10-CM

## 2020-06-11 DIAGNOSIS — D125 Benign neoplasm of sigmoid colon: Secondary | ICD-10-CM

## 2020-06-11 DIAGNOSIS — Z86010 Personal history of colonic polyps: Secondary | ICD-10-CM

## 2020-06-11 DIAGNOSIS — D123 Benign neoplasm of transverse colon: Secondary | ICD-10-CM

## 2020-06-12 ENCOUNTER — Other Ambulatory Visit: Payer: Self-pay | Admitting: Internal Medicine

## 2020-06-25 ENCOUNTER — Other Ambulatory Visit: Payer: Self-pay | Admitting: Gastroenterology

## 2020-06-25 DIAGNOSIS — D123 Benign neoplasm of transverse colon: Secondary | ICD-10-CM

## 2020-06-25 DIAGNOSIS — Z86010 Personal history of colonic polyps: Secondary | ICD-10-CM

## 2020-06-25 DIAGNOSIS — D122 Benign neoplasm of ascending colon: Secondary | ICD-10-CM

## 2020-06-25 DIAGNOSIS — D125 Benign neoplasm of sigmoid colon: Secondary | ICD-10-CM

## 2020-07-13 ENCOUNTER — Encounter: Payer: Self-pay | Admitting: Cardiology

## 2020-07-13 ENCOUNTER — Ambulatory Visit: Payer: 59 | Admitting: Cardiology

## 2020-07-13 ENCOUNTER — Other Ambulatory Visit: Payer: Self-pay

## 2020-07-13 VITALS — BP 140/86 | HR 56 | Ht 65.0 in | Wt 182.0 lb

## 2020-07-13 DIAGNOSIS — E669 Obesity, unspecified: Secondary | ICD-10-CM | POA: Insufficient documentation

## 2020-07-13 DIAGNOSIS — E66811 Obesity, class 1: Secondary | ICD-10-CM

## 2020-07-13 DIAGNOSIS — I10 Essential (primary) hypertension: Secondary | ICD-10-CM

## 2020-07-13 DIAGNOSIS — I2510 Atherosclerotic heart disease of native coronary artery without angina pectoris: Secondary | ICD-10-CM

## 2020-07-13 DIAGNOSIS — E782 Mixed hyperlipidemia: Secondary | ICD-10-CM | POA: Diagnosis not present

## 2020-07-13 HISTORY — DX: Obesity, unspecified: E66.9

## 2020-07-13 HISTORY — DX: Obesity, class 1: E66.811

## 2020-07-13 NOTE — Patient Instructions (Signed)

## 2020-07-13 NOTE — Progress Notes (Signed)
Cardiology Office Note:    Date:  07/13/2020   ID:  Deborah Stewart, DOB 1955/12/26, MRN [id number]  PCP:  Hoyt Koch, MD  Cardiologist:  Jenean Lindau, MD   Referring MD: Hoyt Koch, *    ASSESSMENT:    1. Essential hypertension   2. Coronary artery disease involving native coronary artery of native heart without angina pectoris   3. Mixed hyperlipidemia    PLAN:    In order of problems listed above:  1. Coronary artery disease: Secondary prevention stressed with the patient.  Importance of compliance with diet medication stressed and she vocalized understanding.  She was advised to walk at least half an hour a day on a daily basis.  He is currently leading a sedentary lifestyle and plans to do better. 2. Essential hypertension: Blood pressure stable and diet was emphasized.  Weight reduction was stressed. 3. Obesity: I discussed my findings with the patient at length.  She is just back from a vacation in [place] and plans to do better in her next appointment.  She embarks on a exercise program this week. 4. Mixed dyslipidemia: Diet was emphasized.  Lipids reviewed.  She mentions to me that her blood work done by primary care physician recently was found to be fine. 5. Patient will be seen in follow-up appointment in 6 months or earlier if the patient has any concerns    Medication Adjustments/Labs and Tests Ordered: Current medicines are reviewed at length with the patient today.  Concerns regarding medicines are outlined above.  No orders of the defined types were placed in this encounter.  No orders of the defined types were placed in this encounter.    No chief complaint on file.    History of Present Illness:    Deborah Stewart is a 65 y.o. female.  Patient has past medical history of coronary artery disease post RCA stenting in the past, essential hypertension and dyslipidemia.  She denies any problems at this time and takes care of  activities of daily living.  No chest pain orthopnea or PND.  At the time of my evaluation, the patient is alert awake oriented and in no distress.  Past Medical History:  Diagnosis Date  . Abnormal Pap smear   . Anxiety   . Arthritis    "knees" (02/06/2017)  . ASCUS on Pap smear   . Atrophic vaginitis 09/25/2017  . Breast mass    "I have lumpy breasts" (02/06/2017)  . CAD (coronary artery disease), native coronary artery 02/06/2017  . Chronic back pain 07/08/2012   Pt has had lower back pain from a herniated disc for years, she is s/p cervical diskectomy.   . Chronic left shoulder pain 07/15/2015  . Chronic pain of left knee 07/15/2015  . Coronary artery disease    10/18 PCI/DESx1 to mRCA.   . DDD (degenerative disc disease), cervical    s/p diskectomy   . DDD (degenerative disc disease), lumbar    by MRI  . Diverticulosis of colon (without mention of hemorrhage)   . Dyslipidemia 02/01/2017  . Dysmenorrhea   . Dyspareunia   . Dysplasia of cervix, low grade (CIN 1)   . Endometriosis   . Essential hypertension 02/01/2017  . Former smoker 02/01/2017  . Health care maintenance 07/08/2012   Pt is being followed by a gastroenterologist for her ulcerative colitis they manage her colonoscopies.  Pt has an appointment with her OB/Gyn in July.   Marland Kitchen Herpes simplex type  1 infection 08/30/2016  . History of chicken pox   . History of measles   . Hyperlipidemia   . Hypertension   . Hypothyroidism 11/10/2008   Qualifier: Diagnosis of  By: Sharlett Iles MD Cline Cools R  Chronic problem s/p partial thyroidectomy    . Intermittent claudication ([place]) 07/10/2019  . Left arm pain 01/29/2019  . Lesion of vulva 09/25/2017  . Numbness of fingers 05/21/2020  . Osteoporosis 08/28/2016  . Pelvic pain in female   . Personal history of colonic polyps 06/2010   hyperplastic, tubular adenoma  . Pre-diabetes 11/04/2018  . Right foot pain 11/18/2018  . 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 Snoring disorder 01/14/2013  .  Thyroid nodule   . Ulcerative colitis ([place])   . Unspecified hypothyroidism   . Vitamin D deficiency 08/30/2016    Past Surgical History:  Procedure Laterality Date  . ANTERIOR CERVICAL DECOMP/DISCECTOMY FUSION  2004   Dr. Trenton Gammon  . BACK SURGERY    . BREAST BIOPSY Bilateral   . COLONOSCOPY    . CORONARY ANGIOPLASTY WITH STENT PLACEMENT  02/06/2017  . CORONARY STENT INTERVENTION N/A 02/06/2017   Procedure: CORONARY STENT INTERVENTION;  Surgeon: Belva Crome, MD;  Location: [facility];  Service: Cardiovascular;  Laterality: N/A;  . FOOT SURGERY Right 09/2011    Dr. Ladora Daniel, DPO, removal of "mass"   . FOOT SURGERY Left 1970s X 1  . FOOT SURGERY Right 2000s "multiple"   "botched 1st time; tried to correct several times"  . LEFT HEART CATH AND CORONARY ANGIOGRAPHY N/A 02/06/2017   Procedure: LEFT HEART CATH AND CORONARY ANGIOGRAPHY;  Surgeon: Belva Crome, MD;  Location: [facility];  Service: Cardiovascular;  Laterality: N/A;  . THYROIDECTOMY, PARTIAL  06/2009   "removed goiter"  . TOTAL ABDOMINAL HYSTERECTOMY     TOTAL ABDOMINAL HYSTERECTOMY W/ BILATERAL SALPINGOOPHORECTOMY     Current Medications: Current Meds  Medication Sig  . atorvastatin (LIPITOR) 80 MG tablet Take 80 mg by mouth daily.  . clopidogrel (PLAVIX) 75 MG tablet Take 75 mg by mouth daily.  Marland Kitchen Hyoscyamine Sulfate SL 0.125 MG SUBL Place 1-2 tablets under the tongue every 4 (four) hours as needed (stomach cramps).  Marland Kitchen levothyroxine (SYNTHROID) 100 MCG tablet Take 100 mcg by mouth daily before breakfast.  . metoprolol succinate (TOPROL-XL) 25 MG 24 hr tablet Take 25 mg by mouth daily.  . nitroGLYCERIN (NITROSTAT) 0.4 MG SL tablet Place 0.4 mg under the tongue every 5 (five) minutes as needed for chest pain.  . pantoprazole (PROTONIX) 40 MG tablet Take 40 mg by mouth daily.     Allergies:   Codeine   Social History   Socioeconomic History  . Marital status: Divorced    Spouse name: Not on  file  . Number of children: 1  . Years of education: 44  . Highest education level: Not on file  Occupational History  . Occupation: filed Nurse, mental health: [employer]  Tobacco Use  . Smoking status: Former Smoker    Packs/day: 0.50    Years: 45.00    Pack years: 22.50    Types: Cigarettes    Start date: 12/31/2018    Quit date: 05/29/2019    Years since quitting: 1.1  . Smokeless tobacco: Never Used  . Tobacco comment: taking Chantix  Vaping Use  . Vaping Use: Never used  Substance and Sexual Activity  . Alcohol use: Yes    Alcohol/week: 5.0 standard drinks  Types: 5 Standard drinks or equivalent per week    Comment: 02/06/2017 "might have 1-4 mixed drinks/month"  . Drug use: No  . Sexual activity: Not on file  Other Topics Concern  . Not on file  Social History Narrative   [employer], [employer] -BS social work. Married 1980 - 29 yrs/divorced. 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 Dating - long term relationship/[place]. Unprotected. No history of physical or sexual abuse.    Social Determinants of Health   Financial Resource Strain: Not on file  Food Insecurity: Not on file  Transportation Needs: Not on file  Physical Activity: Not on file  Stress: Not on file  Social Connections: Not on file     Family History: The patient's family history includes Breast cancer in her sister and another family member; Diabetes in her brother, father, mother, and another family member; Heart disease in her brother, father, and another family member; Hyperlipidemia in her brother; Hypertension in her brother and brother; Lung cancer in an other family member. There is no history of Colon cancer, Stomach cancer, Rectal cancer, or Esophageal cancer.  ROS:   Please see the history of present illness.    All other systems reviewed and are negative.  EKGs/Labs/Other Studies Reviewed:    The following studies were reviewed today: EKG reveals sinus rhythm  first-degree AV block and nonspecific ST-T changes   Recent Labs: 01/14/2020: Hemoglobin 15.4; Platelets 321 05/20/2020: ALT 12; BUN 8; Creatinine, Ser 0.80; Potassium 4.2; Sodium 139; TSH 2.74  Recent Lipid Panel    Component Value Date/Time   CHOL 148 01/14/2020 1006   TRIG 138 01/14/2020 1006   HDL 40 01/14/2020 1006   CHOLHDL 3.7 01/14/2020 1006   CHOLHDL 3 05/06/2018 1105   VLDL 21.2 05/06/2018 1105   LDLCALC 84 01/14/2020 1006   LDLDIRECT 112.1 10/11/2012 1554    Physical Exam:    VS:  BP 140/86   Pulse (!) 56   Ht 5\' 5"  (1.651 m)   Wt 182 lb 0.6 oz (82.6 kg)   SpO2 99%   BMI 30.29 kg/m     Wt Readings from Last 3 Encounters:  07/13/20 182 lb 0.6 oz (82.6 kg)  05/20/20 180 lb 3.2 oz (81.7 kg)  05/14/20 180 lb 3.2 oz (81.7 kg)     GEN: Patient is in no acute distress HEENT: Normal NECK: No JVD; No carotid bruits LYMPHATICS: No lymphadenopathy CARDIAC: Hear sounds regular, 2/6 systolic murmur at the apex. RESPIRATORY:  Clear to auscultation without rales, wheezing or rhonchi  ABDOMEN: Soft, non-tender, non-distended MUSCULOSKELETAL:  No edema; No deformity  SKIN: Warm and dry NEUROLOGIC:  Alert and oriented x 3 PSYCHIATRIC:  Normal affect   Signed, Jenean Lindau, MD  07/13/2020 9:56 AM    [facility]

## 2020-10-04 ENCOUNTER — Telehealth: Payer: Self-pay | Admitting: Internal Medicine

## 2020-10-05 NOTE — Telephone Encounter (Signed)
Error

## 2020-11-23 ENCOUNTER — Other Ambulatory Visit: Payer: Self-pay | Admitting: Cardiology

## 2020-11-23 ENCOUNTER — Other Ambulatory Visit: Payer: Self-pay | Admitting: Internal Medicine

## 2020-12-20 ENCOUNTER — Other Ambulatory Visit: Payer: Self-pay

## 2020-12-21 ENCOUNTER — Encounter: Payer: Self-pay | Admitting: Cardiology

## 2020-12-21 ENCOUNTER — Ambulatory Visit: Payer: 59 | Admitting: Cardiology

## 2021-01-13 DIAGNOSIS — L84 Corns and callosities: Secondary | ICD-10-CM | POA: Diagnosis not present

## 2021-01-13 DIAGNOSIS — L602 Onychogryphosis: Secondary | ICD-10-CM | POA: Diagnosis not present

## 2021-01-13 DIAGNOSIS — M216X1 Other acquired deformities of right foot: Secondary | ICD-10-CM | POA: Diagnosis not present

## 2021-01-17 ENCOUNTER — Ambulatory Visit: Payer: 59 | Admitting: Cardiology

## 2021-02-08 DIAGNOSIS — L89892 Pressure ulcer of other site, stage 2: Secondary | ICD-10-CM | POA: Diagnosis not present

## 2021-02-24 ENCOUNTER — Encounter: Payer: Self-pay | Admitting: Internal Medicine

## 2021-02-24 ENCOUNTER — Other Ambulatory Visit: Payer: Self-pay

## 2021-02-24 ENCOUNTER — Ambulatory Visit (INDEPENDENT_AMBULATORY_CARE_PROVIDER_SITE_OTHER): Payer: Medicare HMO | Admitting: Internal Medicine

## 2021-02-24 VITALS — BP 120/90 | HR 71 | Resp 18 | Ht 65.0 in | Wt 183.0 lb

## 2021-02-24 DIAGNOSIS — E782 Mixed hyperlipidemia: Secondary | ICD-10-CM | POA: Diagnosis not present

## 2021-02-24 DIAGNOSIS — R200 Anesthesia of skin: Secondary | ICD-10-CM

## 2021-02-24 DIAGNOSIS — R7303 Prediabetes: Secondary | ICD-10-CM | POA: Diagnosis not present

## 2021-02-24 LAB — LIPID PANEL
Cholesterol: 148 mg/dL (ref 0–200)
HDL: 39.1 mg/dL (ref >39.00)
LDL Cholesterol: 85 mg/dL (ref 0–99)
NonHDL: 109.34
Total CHOL/HDL Ratio: 4
Triglycerides: 123 mg/dL (ref 0.0–149.0)
VLDL: 24.6 mg/dL (ref 0.0–40.0)

## 2021-02-24 LAB — HEMOGLOBIN A1C: Hgb A1c MFr Bld: 6.2 % (ref 4.6–6.5)

## 2021-02-24 NOTE — Progress Notes (Signed)
   Subjective:   Patient ID: Deborah Stewart, female    DOB: 05-23-1955, 65 y.o.   MRN: [id number]  HPI The patient is a 65 YO female coming in for persistent numbness in her hands. Seen back in January for assessment and no improvement since then perhaps worsening. Also smoking again.  Review of Systems  Constitutional: Negative.   HENT: Negative.    Eyes: Negative.   Respiratory:  Negative for cough, chest tightness and shortness of breath.   Cardiovascular:  Negative for chest pain, palpitations and leg swelling.  Gastrointestinal:  Negative for abdominal distention, abdominal pain, constipation, diarrhea, nausea and vomiting.  Musculoskeletal: Negative.   Skin: Negative.   Neurological:  Positive for numbness.  Psychiatric/Behavioral: Negative.     Objective:  Physical Exam Constitutional:      Appearance: She is well-developed.  HENT:     Head: Normocephalic and atraumatic.  Cardiovascular:     Rate and Rhythm: Normal rate and regular rhythm.  Pulmonary:     Effort: Pulmonary effort is normal. No respiratory distress.     Breath sounds: Normal breath sounds. No wheezing or rales.  Abdominal:     General: Bowel sounds are normal. There is no distension.     Palpations: Abdomen is soft.     Tenderness: There is no abdominal tenderness. There is no rebound.  Musculoskeletal:     Cervical back: Normal range of motion.  Skin:    General: Skin is warm and dry.  Neurological:     Mental Status: She is alert and oriented to person, place, and time.     Coordination: Coordination normal.    Vitals:   02/24/21 0949  BP: 120/90  Pulse: 71  Resp: 18  SpO2: 99%  Weight: 183 lb (83 kg)  Height: 5\' 5"  (1.651 m)    This visit occurred during the SARS-CoV-2 public health emergency.  Safety protocols were in place, including screening questions prior to the visit, additional usage of staff PPE, and extensive cleaning of exam room while observing appropriate contact time as  indicated for disinfecting solutions.   Assessment & Plan:  Visit time 25 minutes in face to face communication with patient and coordination of care, additional 5 minutes spent in record review, coordination or care, ordering tests, communicating/referring to other healthcare professionals, documenting in medical records all on the same day of the visit for total time 30 minutes spent on the visit.

## 2021-02-24 NOTE — Patient Instructions (Addendum)
We will check the labs and get you in with the neurologist.

## 2021-02-25 NOTE — Assessment & Plan Note (Signed)
Referral to neurology done. She does not have pre-diabetes but needs follow up HgA1c for pre-diabetes. Prior testing revealed normal thyroid and B12 and vitamin D and CBC levels. Those will not be repeated today. Encouraged her to stop smoking.

## 2021-02-25 NOTE — Assessment & Plan Note (Signed)
Needs follow up HgA1c today which is ordered.

## 2021-02-25 NOTE — Assessment & Plan Note (Signed)
Checking lipid panel as due and she is taking lipitor 80 mg daily. Goal LDL <100.

## 2021-03-02 ENCOUNTER — Telehealth: Payer: Self-pay | Admitting: Internal Medicine

## 2021-03-02 NOTE — Telephone Encounter (Signed)
See below

## 2021-03-02 NOTE — Telephone Encounter (Signed)
Patient was seen in office 10/27 & neuro referral was placed  Patient is requesting new neuro referral be sent to:  Dr. Cherylann Banas [address] [place], [place] [address] [phone] (F)

## 2021-03-03 ENCOUNTER — Other Ambulatory Visit: Payer: Self-pay | Admitting: Internal Medicine

## 2021-03-03 NOTE — Telephone Encounter (Signed)
Request sent to Dr Audelia Acton  fax [facility]

## 2021-03-11 DIAGNOSIS — L602 Onychogryphosis: Secondary | ICD-10-CM | POA: Diagnosis not present

## 2021-03-11 DIAGNOSIS — L89892 Pressure ulcer of other site, stage 2: Secondary | ICD-10-CM | POA: Diagnosis not present

## 2021-03-11 DIAGNOSIS — M79672 Pain in left foot: Secondary | ICD-10-CM | POA: Diagnosis not present

## 2021-03-11 DIAGNOSIS — M79671 Pain in right foot: Secondary | ICD-10-CM | POA: Diagnosis not present

## 2021-03-16 ENCOUNTER — Encounter: Payer: Self-pay | Admitting: Cardiology

## 2021-03-16 ENCOUNTER — Ambulatory Visit: Payer: Medicare HMO | Admitting: Cardiology

## 2021-03-16 ENCOUNTER — Other Ambulatory Visit: Payer: Self-pay

## 2021-03-16 VITALS — BP 128/76 | HR 60 | Ht 65.0 in | Wt 180.1 lb

## 2021-03-16 DIAGNOSIS — I2510 Atherosclerotic heart disease of native coronary artery without angina pectoris: Secondary | ICD-10-CM

## 2021-03-16 DIAGNOSIS — E782 Mixed hyperlipidemia: Secondary | ICD-10-CM | POA: Diagnosis not present

## 2021-03-16 DIAGNOSIS — I10 Essential (primary) hypertension: Secondary | ICD-10-CM | POA: Diagnosis not present

## 2021-03-16 DIAGNOSIS — E669 Obesity, unspecified: Secondary | ICD-10-CM

## 2021-03-16 MED ORDER — NITROGLYCERIN 0.4 MG SL SUBL: 0.4000 mg | SUBLINGUAL_TABLET | SUBLINGUAL | 6 refills | Status: DC | PRN

## 2021-03-16 NOTE — Patient Instructions (Signed)

## 2021-03-16 NOTE — Progress Notes (Signed)
Cardiology Office Note:    Date:  03/16/2021   ID:  Deborah Stewart, DOB 1955-12-30, MRN [id number]  PCP:  Hoyt Koch, MD  Cardiologist:  Jenean Lindau, MD   Referring MD: Hoyt Koch, *    ASSESSMENT:    1. Coronary artery disease involving native coronary artery of native heart without angina pectoris   2. Essential hypertension   3. Mixed hyperlipidemia   4. Obesity (BMI 30.0-34.9)    PLAN:    In order of problems listed above:  Coronary artery disease: Secondary prevention stressed with patient.  Importance of compliance with diet medication stressed and she vocalized understanding.  I told her to exercise to the best of her ability and she promises to do so. Essential hypertension: Blood pressure stable and diet was emphasized.  Lifestyle modification urged. Mixed dyslipidemia: Lipids were reviewed from [facility] sheet.  They were done in October.  They appear fine.  Diet previously. Obesity: Weight reduction stressed.  I told her to exercise to the best of her ability with her orthopedic issues.  Diet was emphasized.  Risks of obesity and she promises to do better. Sublingual nitroglycerin prescription was sent, its protocol and 911 protocol explained and the patient vocalized understanding questions were answered to the patient's satisfaction Patient will be seen in follow-up appointment in 6 months or earlier if the patient has any concerns    Medication Adjustments/Labs and Tests Ordered: Current medicines are reviewed at length with the patient today.  Concerns regarding medicines are outlined above.  No orders of the defined types were placed in this encounter.  Meds ordered this encounter  Medications   nitroGLYCERIN (NITROSTAT) 0.4 MG SL tablet    Sig: Place 1 tablet (0.4 mg total) under the tongue every 5 (five) minutes as needed for chest pain.    Dispense:  25 tablet    Refill:  6     No chief complaint on file.    History of  Present Illness:    Deborah Stewart is a 65 y.o. female.  Patient has past medical history of coronary artery disease, essential hypertension, dyslipidemia and obesity.  She denies any problems at this time and takes care of activities of daily living.  No chest pain orthopnea or PND.  At the time of my evaluation, the patient is alert awake oriented and in no distress.  She does not walk much because of orthopedic issues.  Past Medical History:  Diagnosis Date   Anxiety    Breast mass    "I have lumpy breasts" (02/06/2017)   CAD (coronary artery disease), native coronary artery 02/06/2017   Chronic back pain 07/08/2012   Pt has had lower back pain from a herniated disc for years, she is s/p cervical diskectomy.    Chronic left shoulder pain 07/15/2015   Chronic pain of left knee 07/15/2015   Coronary artery disease    10/18 PCI/DESx1 to mRCA.    DDD (degenerative disc disease), cervical    s/p diskectomy    DDD (degenerative disc disease), lumbar    by MRI   Dysplasia of cervix, low grade (CIN 1)    Endometriosis    Essential hypertension 02/01/2017   Health care maintenance 07/08/2012   Pt is being followed by a gastroenterologist for her ulcerative colitis they manage her colonoscopies.  Pt has an appointment with her OB/Gyn in July.    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 Trenton Gammon   BACK SURGERY     BREAST BIOPSY Bilateral    COLONOSCOPY     CORONARY ANGIOPLASTY WITH STENT PLACEMENT  02/06/2017   CORONARY STENT INTERVENTION N/A 02/06/2017   Procedure: CORONARY STENT INTERVENTION;  Surgeon: Belva Crome, MD;  Location: [place] [facility];  Service: Cardiovascular;  Laterality: N/A;   FOOT SURGERY Right 09/2011    Dr. Ladora Daniel, DPO, removal of "mass"    FOOT SURGERY Left 1970s X 1   FOOT SURGERY Right 2000s "multiple"   "botched 1st time; tried to correct several times"   LEFT HEART CATH AND CORONARY ANGIOGRAPHY N/A 02/06/2017   Procedure: LEFT HEART CATH AND CORONARY ANGIOGRAPHY;  Surgeon: Belva Crome, MD;  Location: [place] [facility];  Service: Cardiovascular;  Laterality: N/A;   THYROIDECTOMY, PARTIAL  06/2009   "removed goiter"   TOTAL ABDOMINAL HYSTERECTOMY     TOTAL ABDOMINAL HYSTERECTOMY W/ BILATERAL SALPINGOOPHORECTOMY     Current Medications: Current Meds  Medication Sig   alendronate (FOSAMAX) 70 MG tablet Take 70 mg by mouth once a week.   atorvastatin (LIPITOR) 80 MG tablet Take 80 mg by mouth daily.   Cholecalciferol 1.25 MG (50000 UT) capsule Take 1 capsule by mouth once a week.   clopidogrel (PLAVIX) 75 MG tablet TAKE 1 TABLET (75 MG TOTAL) BY MOUTH DAILY WITH BREAKFAST.   Hyoscyamine Sulfate SL 0.125 MG SUBL Place 1-2 tablets under the tongue every 4 (four) hours as needed for cramping (stomach cramps).   levothyroxine (SYNTHROID) 100 MCG tablet TAKE 1 TABLET BY MOUTH EVERY DAY BEFORE BREAKFAST   metoprolol succinate (TOPROL-XL) 25 MG 24 hr tablet TAKE 1 TABLET BY MOUTH EVERY DAY   pantoprazole (PROTONIX) 40 MG tablet TAKE 1 TABLET BY MOUTH EVERY DAY   [DISCONTINUED] nitroGLYCERIN (NITROSTAT) 0.4 MG SL tablet Place 0.4 mg under the tongue every 5 (five) minutes as needed for  chest pain.     Allergies:   Codeine   Social History   Socioeconomic History   Marital status: Divorced    Spouse name: Not on file   Number of children: 1   Years of education: 12   Highest education level: Not on file  Occupational History   Occupation: filed Nurse, mental health: [employer]  Tobacco Use   Smoking status: Every Day    Packs/day: 0.50    Years: 45.00    Pack years: 22.50    Types: Cigarettes    Start date: 12/31/2018    Last attempt to quit: 05/29/2019    Years since quitting: 1.8   Smokeless tobacco: Never   Tobacco comments:    taking Chantix  Vaping Use   Vaping Use: Never used  Substance and  Sexual Activity   Alcohol use: Yes    Alcohol/week: 5.0 standard drinks    Types: 5 Standard drinks or equivalent per week    Comment: 02/06/2017 "might have 1-4 mixed drinks/month"   Drug use: No   Sexual activity: Not on file  Other Topics Concern   Not on file  Social History Narrative   [employer], [employer] -BS social work. Married 1980 - 79 yrs/divorced. 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 Dating - long term relationship/[place]. Unprotected. No history of physical or sexual abuse.    Social Determinants of Health   Financial Resource Strain: Not on file  Food Insecurity: Not on file  Transportation Needs: Not on file  Physical Activity: Not on file  Stress: Not on file  Social Connections: Not on file     Family History: The patient's family history includes Breast cancer in her sister and another family member; Diabetes in her brother, father, mother, and another family member; Heart disease in her brother, father, and another family member; Hyperlipidemia in her brother; Hypertension in her brother and brother; Lung cancer in an other family member. There is no history of Colon cancer, Stomach cancer, Rectal cancer, or Esophageal cancer.  ROS:   Please see the history of present illness.    All other systems reviewed  and are negative.  EKGs/Labs/Other Studies Reviewed:    The following studies were reviewed today: I discussed my findings with the patient at length.   Recent Labs: 05/20/2020: ALT 12; BUN 8; Creatinine, Ser 0.80; Potassium 4.2; Sodium 139; TSH 2.74  Recent Lipid Panel    Component Value Date/Time   CHOL 148 02/24/2021 1031   CHOL 148 01/14/2020 1006   TRIG 123.0 02/24/2021 1031   HDL 39.10 02/24/2021 1031   HDL 40 01/14/2020 1006   CHOLHDL 4 02/24/2021 1031   VLDL 24.6 02/24/2021 1031   LDLCALC 85 02/24/2021 1031   LDLCALC 84 01/14/2020 1006   LDLDIRECT 112.1 10/11/2012 1554    Physical Exam:    VS:  BP 128/76   Pulse 60   Ht 5\' 5"  (1.651 m)   Wt 180 lb 1.3 oz (81.7 kg)   SpO2 97%   BMI 29.97 kg/m     Wt Readings from Last 3 Encounters:  03/16/21 180 lb 1.3 oz (81.7 kg)  02/24/21 183 lb (83 kg)  07/13/20 182 lb 0.6 oz (82.6 kg)     GEN: Patient is in no acute distress HEENT: Normal NECK: No JVD; No carotid bruits LYMPHATICS: No lymphadenopathy CARDIAC: Hear sounds regular, 2/6 systolic murmur at the apex. RESPIRATORY:  Clear to auscultation without rales, wheezing or rhonchi  ABDOMEN: Soft, non-tender, non-distended MUSCULOSKELETAL:  No edema; No deformity  SKIN: Warm and dry NEUROLOGIC:  Alert and oriented x 3 PSYCHIATRIC:  Normal affect   Signed, Jenean Lindau, MD  03/16/2021 9:55 AM    [place]

## 2021-04-06 DIAGNOSIS — R202 Paresthesia of skin: Secondary | ICD-10-CM | POA: Diagnosis not present

## 2021-04-15 DIAGNOSIS — L89892 Pressure ulcer of other site, stage 2: Secondary | ICD-10-CM | POA: Diagnosis not present

## 2021-04-19 DIAGNOSIS — G5603 Carpal tunnel syndrome, bilateral upper limbs: Secondary | ICD-10-CM | POA: Diagnosis not present

## 2021-05-06 DIAGNOSIS — M79672 Pain in left foot: Secondary | ICD-10-CM | POA: Diagnosis not present

## 2021-05-06 DIAGNOSIS — L89892 Pressure ulcer of other site, stage 2: Secondary | ICD-10-CM | POA: Diagnosis not present

## 2021-05-06 DIAGNOSIS — M79671 Pain in right foot: Secondary | ICD-10-CM | POA: Diagnosis not present

## 2021-05-13 ENCOUNTER — Encounter: Payer: Self-pay | Admitting: Internal Medicine

## 2021-05-20 ENCOUNTER — Ambulatory Visit (INDEPENDENT_AMBULATORY_CARE_PROVIDER_SITE_OTHER): Payer: Medicare HMO | Admitting: Internal Medicine

## 2021-05-20 ENCOUNTER — Encounter: Payer: Self-pay | Admitting: Internal Medicine

## 2021-05-20 ENCOUNTER — Other Ambulatory Visit: Payer: Self-pay

## 2021-05-20 VITALS — BP 118/70 | HR 67 | Temp 98.7°F | Ht 65.0 in | Wt 185.0 lb

## 2021-05-20 DIAGNOSIS — E782 Mixed hyperlipidemia: Secondary | ICD-10-CM

## 2021-05-20 DIAGNOSIS — K5190 Ulcerative colitis, unspecified, without complications: Secondary | ICD-10-CM | POA: Diagnosis not present

## 2021-05-20 DIAGNOSIS — Z23 Encounter for immunization: Secondary | ICD-10-CM | POA: Diagnosis not present

## 2021-05-20 DIAGNOSIS — Z0000 Encounter for general adult medical examination without abnormal findings: Secondary | ICD-10-CM | POA: Diagnosis not present

## 2021-05-20 DIAGNOSIS — I10 Essential (primary) hypertension: Secondary | ICD-10-CM | POA: Diagnosis not present

## 2021-05-20 DIAGNOSIS — E039 Hypothyroidism, unspecified: Secondary | ICD-10-CM

## 2021-05-20 DIAGNOSIS — R7303 Prediabetes: Secondary | ICD-10-CM | POA: Diagnosis not present

## 2021-05-20 MED ORDER — HYDROCORTISONE 2.5 % EX CREA: TOPICAL_CREAM | Freq: Two times a day (BID) | CUTANEOUS | 0 refills | Status: DC

## 2021-05-20 NOTE — Assessment & Plan Note (Signed)
Recent HgA1c at goal and will monitor every 6 months. Not due today.

## 2021-05-20 NOTE — Assessment & Plan Note (Signed)
BP at goal on metoprolol 25 mg daily. Recent BMP at goal and will recheck at next lab draw.

## 2021-05-20 NOTE — Assessment & Plan Note (Signed)
Flu shot given. Covid-19 counseled about booster. Pneumonia declines. Shingrix declines. Tetanus declines. Colonoscopy due 2024. Mammogram due 2024, pap smear not indicated and dexa complete. Counseled about sun safety and mole surveillance. Counseled about the dangers of distracted driving. Given 10 year screening recommendations.

## 2021-05-20 NOTE — Assessment & Plan Note (Signed)
Taking synthroid 100 mcg daily and TSH within 1 year not due today.

## 2021-05-20 NOTE — Assessment & Plan Note (Signed)
Recent lipid panel at goal. Taking lipitor 80 mg daily.

## 2021-05-20 NOTE — Patient Instructions (Signed)
I have sent in the cream to use pea sized amount on the outside twice a day for 2 weeks.

## 2021-05-20 NOTE — Progress Notes (Addendum)
Subjective:   Patient ID: Deborah Stewart, female    DOB: 1955/11/07, 66 y.o.   MRN: [id number]  HPI Here for welcome to medicare wellness and physical, no new complaints. Please see A/P for status and treatment of chronic medical problems.   Diet: heart healthy Physical activity: sedentary Depression/mood screen: negative Hearing: intact to whispered voice Visual acuity: grossly normal, overdue performs annual eye exam  ADLs: capable Fall risk: none Home safety: good Cognitive evaluation: intact to orientation, naming, recall and repetition EOL planning: adv directives discussed  [place] Visit from 05/20/2021 in [place] at [facility] Total Score 0        Fall Risk 05/20/2021  Falls in the past year? 0  Was there an injury with Fall? 0  Fall Risk Category Calculator 0  Fall Risk Category Low   Vision Screening   Right eye Left eye Both eyes  Without correction 20/25 20/25 20/25   With correction       I have personally reviewed and have noted 1. The patient's medical and social history - reviewed today no changes 2. Their use of alcohol, tobacco or illicit drugs 3. Their current medications and supplements 4. The patient's functional ability including ADL's, fall risks, home safety risks and hearing or visual impairment. 5. Diet and physical activities 6. Evidence for depression or mood disorders 7. Care team reviewed and updated 8.  The patient is not on an opioid pain medication.  Patient Care Team: Hoyt Koch, MD as PCP - General (Internal Medicine) Revankar, Reita Cliche, MD as PCP - Cardiology (Cardiology) Sable Feil, MD (Gastroenterology) Leo Grosser Seymour Bars, MD (Inactive) (Obstetrics and Gynecology) Past Medical History:  Diagnosis Date   Anxiety    Breast mass    "I have lumpy breasts" (02/06/2017)   CAD (coronary artery disease), native coronary artery 02/06/2017   Chronic back pain 07/08/2012   Pt has had  lower back pain from a herniated disc for years, she is s/p cervical diskectomy.    Chronic left shoulder pain 07/15/2015   Chronic pain of left knee 07/15/2015   Coronary artery disease    10/18 PCI/DESx1 to mRCA.    DDD (degenerative disc disease), cervical    s/p diskectomy    DDD (degenerative disc disease), lumbar    by MRI   Dysplasia of cervix, low grade (CIN 1)    Endometriosis    Essential hypertension 02/01/2017   Health care maintenance 07/08/2012   Pt is being followed by a gastroenterologist for her ulcerative colitis they manage her colonoscopies.  Pt has an appointment with her OB/Gyn in July.    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  [facility]   BACK SURGERY     BREAST BIOPSY Bilateral    COLONOSCOPY     CORONARY ANGIOPLASTY WITH STENT PLACEMENT  02/06/2017   CORONARY STENT INTERVENTION N/A 02/06/2017   Procedure: CORONARY STENT INTERVENTION;  Surgeon: Belva Crome, MD;  Location: [place] [facility];  Service: Cardiovascular;  Laterality: N/A;   FOOT SURGERY Right 09/2011    Dr. Ladora Daniel, DPO, removal of "mass"    FOOT SURGERY Left 1970s X 1   FOOT SURGERY Right 2000s "multiple"   "botched 1st time; tried to  correct several times"   LEFT HEART CATH AND CORONARY ANGIOGRAPHY N/A 02/06/2017   Procedure: LEFT HEART CATH AND CORONARY ANGIOGRAPHY;  Surgeon: Belva Crome, MD;  Location: [place] [facility];  Service: Cardiovascular;  Laterality: N/A;   THYROIDECTOMY, PARTIAL  06/2009   "removed goiter"   TOTAL ABDOMINAL HYSTERECTOMY     TOTAL ABDOMINAL HYSTERECTOMY W/ BILATERAL SALPINGOOPHORECTOMY    Family History  Problem Relation Age of Onset   Breast cancer Sister    Diabetes Mother    Diabetes Father    Heart disease Father        CHF   Diabetes Brother    Heart disease Brother    Hypertension Brother    Hyperlipidemia Brother    Hypertension Brother    Lung cancer Other        uncle   Breast cancer Other        great aunt   Diabetes Other        grandparents   Heart disease Other        grandfather   Colon cancer Neg Hx    Stomach cancer Neg Hx    Rectal cancer Neg Hx    Esophageal cancer Neg Hx    Review of Systems  Constitutional: Negative.   HENT: Negative.    Eyes: Negative.   Respiratory:  Negative for cough, chest tightness and shortness of breath.   Cardiovascular:  Negative for chest pain, palpitations and leg swelling.  Gastrointestinal:  Negative for abdominal distention, abdominal pain, constipation, diarrhea, nausea and vomiting.  Musculoskeletal:  Positive for arthralgias and back pain.  Skin: Negative.   Neurological: Negative.   Psychiatric/Behavioral: Negative.     Objective:  Physical Exam Constitutional:      Appearance: She is well-developed.  HENT:     Head: Normocephalic and atraumatic.  Cardiovascular:     Rate and Rhythm: Normal rate and regular rhythm.  Pulmonary:     Effort: Pulmonary effort is normal. No respiratory distress.     Breath sounds: Normal breath sounds. No wheezing or rales.  Abdominal:     General: Bowel sounds are normal. There is no distension.     Palpations: Abdomen is soft.     Tenderness: There is no abdominal  tenderness. There is no rebound.  Musculoskeletal:        General: Tenderness present.     Cervical back: Normal range of motion.  Skin:    General: Skin is warm and dry.  Neurological:     Mental Status: She is alert and oriented to person, place, and time.     Coordination: Coordination normal.    Vitals:   05/20/21 1436  BP: 118/70  Pulse: 67  Temp: 98.7 F (37.1 C)  TempSrc: Oral  SpO2: 98%  Weight: 185 lb (83.9 kg)  Height: 5\' 5"  (1.651 m)   This visit occurred during the SARS-CoV-2 public health emergency.  Safety protocols were in place, including screening questions prior to the visit, additional usage of staff PPE, and extensive cleaning of exam room while observing appropriate contact time as indicated for disinfecting solutions.   Assessment & Plan:  Flu shot given at visit

## 2021-05-20 NOTE — Assessment & Plan Note (Signed)
Had recent flare and new hemorrhoid. Rx hydrocortisone 2.5 % cream.

## 2021-05-23 NOTE — Addendum Note (Signed)
Addended by: Durwin Nora on: 05/23/2021 08:23 AM   Modules accepted: Orders

## 2021-06-01 DIAGNOSIS — L89892 Pressure ulcer of other site, stage 2: Secondary | ICD-10-CM | POA: Diagnosis not present

## 2021-06-01 DIAGNOSIS — M79672 Pain in left foot: Secondary | ICD-10-CM | POA: Diagnosis not present

## 2021-06-01 DIAGNOSIS — M79671 Pain in right foot: Secondary | ICD-10-CM | POA: Diagnosis not present

## 2021-06-22 DIAGNOSIS — L602 Onychogryphosis: Secondary | ICD-10-CM | POA: Diagnosis not present

## 2021-06-22 DIAGNOSIS — M79672 Pain in left foot: Secondary | ICD-10-CM | POA: Diagnosis not present

## 2021-06-22 DIAGNOSIS — M79671 Pain in right foot: Secondary | ICD-10-CM | POA: Diagnosis not present

## 2021-06-22 DIAGNOSIS — L89892 Pressure ulcer of other site, stage 2: Secondary | ICD-10-CM | POA: Diagnosis not present

## 2021-07-10 ENCOUNTER — Other Ambulatory Visit: Payer: Self-pay | Admitting: Internal Medicine

## 2021-07-18 DIAGNOSIS — L89892 Pressure ulcer of other site, stage 2: Secondary | ICD-10-CM | POA: Diagnosis not present

## 2021-07-18 DIAGNOSIS — M79671 Pain in right foot: Secondary | ICD-10-CM | POA: Diagnosis not present

## 2021-07-18 DIAGNOSIS — M79672 Pain in left foot: Secondary | ICD-10-CM | POA: Diagnosis not present

## 2021-07-31 ENCOUNTER — Other Ambulatory Visit: Payer: Self-pay

## 2021-07-31 ENCOUNTER — Ambulatory Visit
Admission: EM | Admit: 2021-07-31 | Discharge: 2021-07-31 | Disposition: A | Discharge status: Home / Self Care | Payer: Medicare HMO | Attending: Physician Assistant | Admitting: Physician Assistant

## 2021-07-31 DIAGNOSIS — H65191 Other acute nonsuppurative otitis media, right ear: Secondary | ICD-10-CM | POA: Diagnosis not present

## 2021-07-31 MED ORDER — OFLOXACIN 0.3 % OT SOLN: 5.0000 [drp] | Freq: Two times a day (BID) | OTIC | 0 refills | Status: AC

## 2021-07-31 MED ORDER — AMOXICILLIN 500 MG PO CAPS: 500.0000 mg | ORAL_CAPSULE | Freq: Three times a day (TID) | ORAL | 0 refills | Status: DC

## 2021-07-31 NOTE — ED Triage Notes (Signed)
3 day h/o right ear pain, 2 days of sore throat and onset yesterday of left ear pain. Pt describes pain as if she's flying. Has been taking tylenol, water/vinegar steam and CBD oil. Notes pain w/swallowing and some congestion. No runny nose, v/d. ?

## 2021-07-31 NOTE — ED Provider Notes (Signed)
?[place] ? ? ? ?CSN: [id number] ?Arrival date & time: 07/31/21  1420 ? ? ?  ? ?History   ?Chief Complaint ?Chief Complaint  ?Patient presents with  ? Otalgia  ?  bilateral  ? ? ?HPI ?Deborah Stewart is a 66 y.o. female.  ? ?Patient here today for evaluation of right ear pain that started 3 days ago. She has now developed mild left ear pain and started to have some sore throat as well. She has tried tylenol and CBD oil in her ear without significant improvement. She has not had runny nose, vomiting or diarrhea. She denies fever.  ? ?The history is provided by the patient.  ? ?Past Medical History:  ?Diagnosis Date  ? Anxiety   ? Breast mass   ? "I have lumpy breasts" (02/06/2017)  ? CAD (coronary artery disease), native coronary artery 02/06/2017  ? Chronic back pain 07/08/2012  ? Pt has had lower back pain from a herniated disc for years, she is s/p cervical diskectomy.   ? Chronic left shoulder pain 07/15/2015  ? Chronic pain of left knee 07/15/2015  ? Coronary artery disease   ? 10/18 PCI/DESx1 to mRCA.   ? DDD (degenerative disc disease), cervical   ? s/p diskectomy   ? DDD (degenerative disc disease), lumbar   ? by MRI  ? Dysplasia of cervix, low grade (CIN 1)   ? Endometriosis   ? Essential hypertension 02/01/2017  ? Health care maintenance 07/08/2012  ? Pt is being followed by a gastroenterologist for her ulcerative colitis they manage her colonoscopies.  Pt has an appointment with her OB/Gyn in July.   ? 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  ? Hyperlipidemia   ? Hypertension   ? Hypothyroidism 11/10/2008  ? Qualifier: Diagnosis of  By: Sharlett Iles MD Cline Cools R  Chronic problem s/p partial thyroidectomy    ? Intermittent claudication ([place]) 07/10/2019  ? Left arm pain 01/29/2019  ? Lesion of vulva 09/25/2017  ? Numbness of fingers 05/21/2020  ? Obesity (BMI 30.0-34.9) 07/13/2020  ? Osteoporosis 08/28/2016  ? Personal history of colonic polyps 06/2010  ? hyperplastic, tubular adenoma  ?  Pre-diabetes 11/04/2018  ? Right foot pain 11/18/2018  ? 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 Snoring disorder 01/14/2013  ? Thyroid nodule   ? Ulcerative colitis ([place])   ? Vitamin D deficiency 08/30/2016  ? ? ?Patient Active Problem List  ? Diagnosis Date Noted  ? Obesity (BMI 30.0-34.9) 07/13/2020  ? Numbness of fingers 05/21/2020  ? Coronary artery disease   ? DDD (degenerative disc disease), cervical   ? Hyperlipidemia   ? Hypertension   ? Thyroid nodule   ? Ulcerative colitis ([place])   ? Intermittent claudication ([place]) 07/10/2019  ? Right foot pain 11/18/2018  ? Pre-diabetes 11/04/2018  ? Lesion of vulva 09/25/2017  ? Essential hypertension 02/01/2017  ? 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  ? Vitamin D deficiency 08/30/2016  ? Osteoporosis 08/28/2016  ? Chronic left shoulder pain 07/15/2015  ? Chronic pain of left knee 07/15/2015  ? Snoring disorder 01/14/2013  ? Health care maintenance 07/08/2012  ? Chronic back pain 07/08/2012  ? Personal history of colonic polyps 06/2010  ? Hypothyroidism 11/10/2008  ? ? ?Past Surgical History:  ?Procedure Laterality Date  ? ANTERIOR CERVICAL DECOMP/DISCECTOMY FUSION  2004  ? Dr. Trenton Gammon  ? BACK SURGERY    ? BREAST BIOPSY Bilateral   ? COLONOSCOPY    ? CORONARY ANGIOPLASTY WITH STENT  PLACEMENT  02/06/2017  ? CORONARY STENT INTERVENTION N/A 02/06/2017  ? Procedure: CORONARY STENT INTERVENTION;  Surgeon: Belva Crome, MD;  Location: [facility];  Service: Cardiovascular;  Laterality: N/A;  ? FOOT SURGERY Right 09/2011  ?  Dr. Ladora Daniel, DPO, removal of "mass"   ? FOOT SURGERY Left 1970s X 1  ? FOOT SURGERY Right 2000s "multiple"  ? "botched 1st time; tried to correct several times"  ? LEFT HEART CATH AND CORONARY ANGIOGRAPHY N/A 02/06/2017  ? Procedure: LEFT HEART CATH AND CORONARY ANGIOGRAPHY;  Surgeon: Belva Crome, MD;  Location: [facility];  Service: Cardiovascular;  Laterality: N/A;  ? THYROIDECTOMY, PARTIAL  06/2009  ? "removed goiter"  ?  TOTAL ABDOMINAL HYSTERECTOMY    ? TOTAL ABDOMINAL HYSTERECTOMY W/ BILATERAL SALPINGOOPHORECTOMY   ? ? ?OB History   ? ? Gravida  ?1  ? Para  ?1  ? Term  ?   ? Preterm  ?   ? AB  ?   ? Living  ?   ?  ? ? SAB  ?   ? IAB  ?   ? Ectopic  ?   ? Multiple  ?   ? Live Births  ?   ?   ?  ?  ? ? ? ?Home Medications   ? ?Prior to Admission medications   ?Medication Sig Start Date End Date Taking? Authorizing Provider  ?amoxicillin (AMOXIL) 500 MG capsule Take 1 capsule (500 mg total) by mouth 3 (three) times daily. 07/31/21  Yes Francene Finders, PA-C  ?ofloxacin (FLOXIN) 0.3 % OTIC solution Place 5 drops into the right ear 2 (two) times daily for 7 days. 07/31/21 08/07/21 Yes Francene Finders, PA-C  ?alendronate (FOSAMAX) 70 MG tablet Take 70 mg by mouth once a week. 12/09/20   [provider]  ?atorvastatin (LIPITOR) 80 MG tablet Take 80 mg by mouth daily.    [provider]  ?Cholecalciferol 1.25 MG (50000 UT) capsule Take 1 capsule by mouth once a week.    [provider]  ?clopidogrel (PLAVIX) 75 MG tablet TAKE 1 TABLET (75 MG TOTAL) BY MOUTH DAILY WITH BREAKFAST. 11/23/20   Revankar, Reita Cliche, MD  ?hydrocortisone 2.5 % cream Apply topically 2 (two) times daily. 05/20/21   Hoyt Koch, MD  ?Hyoscyamine Sulfate SL 0.125 MG SUBL Place 1-2 tablets under the tongue every 4 (four) hours as needed for cramping (stomach cramps).    [provider]  ?levothyroxine (SYNTHROID) 100 MCG tablet TAKE 1 TABLET BY MOUTH EVERY DAY BEFORE BREAKFAST 03/03/21   Hoyt Koch, MD  ?metoprolol succinate (TOPROL-XL) 25 MG 24 hr tablet TAKE 1 TABLET BY MOUTH EVERY DAY 11/23/20   Revankar, Reita Cliche, MD  ?nitroGLYCERIN (NITROSTAT) 0.4 MG SL tablet Place 1 tablet (0.4 mg total) under the tongue every 5 (five) minutes as needed for chest pain. 03/16/21   Revankar, Reita Cliche, MD  ?pantoprazole (PROTONIX) 40 MG tablet TAKE 1 TABLET BY MOUTH EVERY DAY 07/11/21   Hoyt Koch, MD  ? ? ?Family  History ?Family History  ?Problem Relation Age of Onset  ? Breast cancer Sister   ? Diabetes Mother   ? Diabetes Father   ? Heart disease Father   ?     CHF  ? Diabetes Brother   ? Heart disease Brother   ? Hypertension Brother   ? Hyperlipidemia Brother   ? Hypertension Brother   ? Lung cancer Other   ?  uncle  ? Breast cancer Other   ?     great aunt  ? Diabetes Other   ?     grandparents  ? Heart disease Other   ?     grandfather  ? Colon cancer Neg Hx   ? Stomach cancer Neg Hx   ? Rectal cancer Neg Hx   ? Esophageal cancer Neg Hx   ? ? ?Social History ?Social History  ? ?Tobacco Use  ? Smoking status: Every Day  ?  Packs/day: 0.50  ?  Years: 45.00  ?  Pack years: 22.50  ?  Types: Cigarettes  ?  Start date: 12/31/2018  ?  Last attempt to quit: 05/29/2019  ?  Years since quitting: 2.1  ? Smokeless tobacco: Never  ? Tobacco comments:  ?  taking Chantix  ?Vaping Use  ? Vaping Use: Never used  ?Substance Use Topics  ? Alcohol use: Yes  ?  Alcohol/week: 5.0 standard drinks  ?  Types: 5 Standard drinks or equivalent per week  ?  Comment: 02/06/2017 "might have 1-4 mixed drinks/month"  ? Drug use: No  ? ? ? ?Allergies   ?Codeine ? ? ?Review of Systems ?Review of Systems  ?Constitutional:  Negative for chills and fever.  ?HENT:  Positive for ear pain and sore throat. Negative for congestion.   ?Eyes:  Negative for discharge and redness.  ?Respiratory:  Negative for cough and shortness of breath.   ?Gastrointestinal:  Negative for abdominal pain, diarrhea, nausea and vomiting.  ? ? ?Physical Exam ?Triage Vital Signs ?ED Triage Vitals  ?Enc Vitals Group  ?   BP   ?   Pulse   ?   Resp   ?   Temp   ?   Temp src   ?   SpO2   ?   Weight   ?   Height   ?   Head Circumference   ?   Peak Flow   ?   Pain Score   ?   Pain Loc   ?   Pain Edu?   ?   Excl. in [place]?   ? ?No data found. ? ?Updated Vital Signs ?BP (!) 167/88 (BP Location: Left Arm)   Pulse 69   Temp 98.3 ?F (36.8 ?C) (Oral)   Resp 18   SpO2 94%  ?   ? ?Physical  Exam ?Vitals and nursing note reviewed.  ?Constitutional:   ?   General: She is not in acute distress. ?   Appearance: Normal appearance. She is not ill-appearing.  ?HENT:  ?   Head: Normocephalic and atraumatic.  ?   Left Ear: Tympan

## 2021-08-08 DIAGNOSIS — M79671 Pain in right foot: Secondary | ICD-10-CM | POA: Diagnosis not present

## 2021-08-08 DIAGNOSIS — M79672 Pain in left foot: Secondary | ICD-10-CM | POA: Diagnosis not present

## 2021-08-08 DIAGNOSIS — L89892 Pressure ulcer of other site, stage 2: Secondary | ICD-10-CM | POA: Diagnosis not present

## 2021-08-27 ENCOUNTER — Other Ambulatory Visit: Payer: Self-pay | Admitting: Internal Medicine

## 2021-08-28 ENCOUNTER — Ambulatory Visit
Admission: EM | Admit: 2021-08-28 | Discharge: 2021-08-28 | Disposition: A | Discharge status: Home / Self Care | Payer: Medicare HMO | Attending: Urgent Care | Admitting: Urgent Care

## 2021-08-28 ENCOUNTER — Other Ambulatory Visit: Payer: Self-pay

## 2021-08-28 ENCOUNTER — Encounter: Payer: Self-pay | Admitting: Emergency Medicine

## 2021-08-28 DIAGNOSIS — H6981 Other specified disorders of Eustachian tube, right ear: Secondary | ICD-10-CM | POA: Diagnosis not present

## 2021-08-28 MED ORDER — LEVOCETIRIZINE DIHYDROCHLORIDE 5 MG PO TABS: 5.0000 mg | ORAL_TABLET | Freq: Every evening | ORAL | 0 refills | Status: DC

## 2021-08-28 MED ORDER — FLUTICASONE PROPIONATE 50 MCG/ACT NA SUSP: 2.0000 | Freq: Every day | NASAL | 12 refills | Status: AC

## 2021-08-28 NOTE — ED Triage Notes (Signed)
Pt here for right ear fullness; pt was recently treated for ear infection ?

## 2021-08-28 NOTE — ED Provider Notes (Signed)
?[place] ? ? ?MRN: [id number] DOB: 1955-12-09 ? ?Subjective:  ? ?Deborah Stewart is a 66 y.o. female presenting for recheck on persistent right ear fullness, ringing, high-pitched noises, intermittent ear pains.  Patient just underwent a course of amoxicillin for ear infection and reports that it did not make much of a difference. ? ?No current facility-administered medications for this encounter. ? ?Current Outpatient Medications:  ?  alendronate (FOSAMAX) 70 MG tablet, Take 70 mg by mouth once a week., Disp: , Rfl:  ?  amoxicillin (AMOXIL) 500 MG capsule, Take 1 capsule (500 mg total) by mouth 3 (three) times daily. (Patient not taking: Reported on 08/28/2021), Disp: 21 capsule, Rfl: 0 ?  atorvastatin (LIPITOR) 80 MG tablet, Take 80 mg by mouth daily., Disp: , Rfl:  ?  Cholecalciferol 1.25 MG (50000 UT) capsule, Take 1 capsule by mouth once a week., Disp: , Rfl:  ?  clopidogrel (PLAVIX) 75 MG tablet, TAKE 1 TABLET (75 MG TOTAL) BY MOUTH DAILY WITH BREAKFAST., Disp: 90 tablet, Rfl: 2 ?  hydrocortisone 2.5 % cream, Apply topically 2 (two) times daily., Disp: 100 g, Rfl: 0 ?  Hyoscyamine Sulfate SL 0.125 MG SUBL, Place 1-2 tablets under the tongue every 4 (four) hours as needed for cramping (stomach cramps)., Disp: , Rfl:  ?  levothyroxine (SYNTHROID) 100 MCG tablet, TAKE 1 TABLET BY MOUTH EVERY DAY BEFORE BREAKFAST, Disp: 90 tablet, Rfl: 1 ?  metoprolol succinate (TOPROL-XL) 25 MG 24 hr tablet, TAKE 1 TABLET BY MOUTH EVERY DAY, Disp: 90 tablet, Rfl: 2 ?  nitroGLYCERIN (NITROSTAT) 0.4 MG SL tablet, Place 1 tablet (0.4 mg total) under the tongue every 5 (five) minutes as needed for chest pain., Disp: 25 tablet, Rfl: 6 ?  pantoprazole (PROTONIX) 40 MG tablet, TAKE 1 TABLET BY MOUTH EVERY DAY, Disp: 90 tablet, Rfl: 1  ? ?Allergies  ?Allergen Reactions  ? Codeine   ?  REACTION: nausea and hives  ? ? ?Past Medical History:  ?Diagnosis Date  ? Anxiety   ? Breast mass   ? "I have lumpy breasts" (02/06/2017)   ? CAD (coronary artery disease), native coronary artery 02/06/2017  ? Chronic back pain 07/08/2012  ? Pt has had lower back pain from a herniated disc for years, she is s/p cervical diskectomy.   ? Chronic left shoulder pain 07/15/2015  ? Chronic pain of left knee 07/15/2015  ? Coronary artery disease   ? 10/18 PCI/DESx1 to mRCA.   ? DDD (degenerative disc disease), cervical   ? s/p diskectomy   ? DDD (degenerative disc disease), lumbar   ? by MRI  ? Dysplasia of cervix, low grade (CIN 1)   ? Endometriosis   ? Essential hypertension 02/01/2017  ? Health care maintenance 07/08/2012  ? Pt is being followed by a gastroenterologist for her ulcerative colitis they manage her colonoscopies.  Pt has an appointment with her OB/Gyn in July.   ? 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  ? Hyperlipidemia   ? Hypertension   ? Hypothyroidism 11/10/2008  ? Qualifier: Diagnosis of  By: Sharlett Iles MD Cline Cools R  Chronic problem s/p partial thyroidectomy    ? Intermittent claudication ([place]) 07/10/2019  ? Left arm pain 01/29/2019  ? Lesion of vulva 09/25/2017  ? Numbness of fingers 05/21/2020  ? Obesity (BMI 30.0-34.9) 07/13/2020  ? Osteoporosis 08/28/2016  ? Personal history of colonic polyps 06/2010  ? hyperplastic, tubular adenoma  ? Pre-diabetes 11/04/2018  ? Right foot pain 11/18/2018  ?  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 Snoring disorder 01/14/2013  ? Thyroid nodule   ? Ulcerative colitis ([place])   ? Vitamin D deficiency 08/30/2016  ?  ? ?Past Surgical History:  ?Procedure Laterality Date  ? ANTERIOR CERVICAL DECOMP/DISCECTOMY FUSION  2004  ? Dr. Trenton Gammon  ? BACK SURGERY    ? BREAST BIOPSY Bilateral   ? COLONOSCOPY    ? CORONARY ANGIOPLASTY WITH STENT PLACEMENT  02/06/2017  ? CORONARY STENT INTERVENTION N/A 02/06/2017  ? Procedure: CORONARY STENT INTERVENTION;  Surgeon: Belva Crome, MD;  Location: [facility];  Service: Cardiovascular;  Laterality: N/A;  ? FOOT SURGERY Right 09/2011  ?  Dr. Ladora Daniel, DPO,  removal of "mass"   ? FOOT SURGERY Left 1970s X 1  ? FOOT SURGERY Right 2000s "multiple"  ? "botched 1st time; tried to correct several times"  ? LEFT HEART CATH AND CORONARY ANGIOGRAPHY N/A 02/06/2017  ? Procedure: LEFT HEART CATH AND CORONARY ANGIOGRAPHY;  Surgeon: Belva Crome, MD;  Location: [facility];  Service: Cardiovascular;  Laterality: N/A;  ? THYROIDECTOMY, PARTIAL  06/2009  ? "removed goiter"  ? TOTAL ABDOMINAL HYSTERECTOMY    ? TOTAL ABDOMINAL HYSTERECTOMY W/ BILATERAL SALPINGOOPHORECTOMY   ? ? ?Family History  ?Problem Relation Age of Onset  ? Breast cancer Sister   ? Diabetes Mother   ? Diabetes Father   ? Heart disease Father   ?     CHF  ? Diabetes Brother   ? Heart disease Brother   ? Hypertension Brother   ? Hyperlipidemia Brother   ? Hypertension Brother   ? Lung cancer Other   ?     uncle  ? Breast cancer Other   ?     great aunt  ? Diabetes Other   ?     grandparents  ? Heart disease Other   ?     grandfather  ? Colon cancer Neg Hx   ? Stomach cancer Neg Hx   ? Rectal cancer Neg Hx   ? Esophageal cancer Neg Hx   ? ? ?Social History  ? ?Tobacco Use  ? Smoking status: Every Day  ?  Packs/day: 0.50  ?  Years: 45.00  ?  Pack years: 22.50  ?  Types: Cigarettes  ?  Start date: 12/31/2018  ?  Last attempt to quit: 05/29/2019  ?  Years since quitting: 2.2  ? Smokeless tobacco: Never  ? Tobacco comments:  ?  taking Chantix  ?Vaping Use  ? Vaping Use: Never used  ?Substance Use Topics  ? Alcohol use: Yes  ?  Alcohol/week: 5.0 standard drinks  ?  Types: 5 Standard drinks or equivalent per week  ?  Comment: 02/06/2017 "might have 1-4 mixed drinks/month"  ? Drug use: No  ? ? ?ROS ? ? ?Objective:  ? ?Vitals: ?BP (!) 157/76 (BP Location: Left Arm)   Pulse 72   Temp 98.3 ?F (36.8 ?C) (Oral)   Resp 18   SpO2 95%  ? ?Physical Exam ?Constitutional:   ?   General: She is not in acute distress. ?   Appearance: Normal appearance. She is well-developed. She is not ill-appearing, toxic-appearing or  diaphoretic.  ?HENT:  ?   Head: Normocephalic and atraumatic.  ?   Right Ear: Tympanic membrane, ear canal and external ear normal. No tenderness. There is no impacted cerumen. Tympanic membrane is not injected, perforated, erythematous or bulging.  ?   Left Ear: Tympanic membrane, ear  canal and external ear normal. No tenderness. There is no impacted cerumen. Tympanic membrane is not injected, perforated, erythematous or bulging.  ?   Nose: Nose normal.  ?   Mouth/Throat:  ?   Mouth: Mucous membranes are moist.  ?Eyes:  ?   General: No scleral icterus.    ?   Right eye: No discharge.     ?   Left eye: No discharge.  ?   Extraocular Movements: Extraocular movements intact.  ?Cardiovascular:  ?   Rate and Rhythm: Normal rate.  ?Pulmonary:  ?   Effort: Pulmonary effort is normal.  ?Skin: ?   General: Skin is warm and dry.  ?Neurological:  ?   General: No focal deficit present.  ?   Mental Status: She is alert and oriented to person, place, and time.  ?Psychiatric:     ?   Mood and Affect: Mood normal.     ?   Behavior: Behavior normal.  ? ? ?Assessment and Plan :  ? ?PDMP not reviewed this encounter. ? ?1. Eustachian tube dysfunction, right   ? ?Unremarkable ENT exam.  Will use conservative management for what I suspect is eustachian tube dysfunction.  Recommended starting Flonase, Xyzal.  Will defer further antibiotic use as there is no sign of otitis media, otitis externa.  Recommended follow-up with [facility] ear nose throat should her symptoms persist.  Counseled patient on potential for adverse effects with medications prescribed/recommended today, ER and return-to-clinic precautions discussed, patient verbalized understanding. ? ?  ?Jaynee Eagles, PA-C ?08/28/21 [id number] ? ?

## 2021-08-29 DIAGNOSIS — M79671 Pain in right foot: Secondary | ICD-10-CM | POA: Diagnosis not present

## 2021-08-29 DIAGNOSIS — L89892 Pressure ulcer of other site, stage 2: Secondary | ICD-10-CM | POA: Diagnosis not present

## 2021-08-29 DIAGNOSIS — M79672 Pain in left foot: Secondary | ICD-10-CM | POA: Diagnosis not present

## 2021-09-19 DIAGNOSIS — M79671 Pain in right foot: Secondary | ICD-10-CM | POA: Diagnosis not present

## 2021-09-19 DIAGNOSIS — L89892 Pressure ulcer of other site, stage 2: Secondary | ICD-10-CM | POA: Diagnosis not present

## 2021-10-03 DIAGNOSIS — Z1231 Encounter for screening mammogram for malignant neoplasm of breast: Secondary | ICD-10-CM | POA: Diagnosis not present

## 2021-10-13 DIAGNOSIS — M79672 Pain in left foot: Secondary | ICD-10-CM | POA: Diagnosis not present

## 2021-10-13 DIAGNOSIS — M79671 Pain in right foot: Secondary | ICD-10-CM | POA: Diagnosis not present

## 2021-10-13 DIAGNOSIS — L89892 Pressure ulcer of other site, stage 2: Secondary | ICD-10-CM | POA: Diagnosis not present

## 2021-10-18 DIAGNOSIS — I10 Essential (primary) hypertension: Secondary | ICD-10-CM | POA: Diagnosis not present

## 2021-10-18 DIAGNOSIS — Z6830 Body mass index (BMI) 30.0-30.9, adult: Secondary | ICD-10-CM | POA: Diagnosis not present

## 2021-10-18 DIAGNOSIS — L0292 Furuncle, unspecified: Secondary | ICD-10-CM | POA: Diagnosis not present

## 2021-10-18 DIAGNOSIS — M810 Age-related osteoporosis without current pathological fracture: Secondary | ICD-10-CM | POA: Diagnosis not present

## 2021-10-18 DIAGNOSIS — Z01411 Encounter for gynecological examination (general) (routine) with abnormal findings: Secondary | ICD-10-CM | POA: Diagnosis not present

## 2021-10-20 ENCOUNTER — Encounter: Payer: Self-pay | Admitting: Cardiology

## 2021-10-20 ENCOUNTER — Ambulatory Visit: Payer: Medicare HMO | Admitting: Cardiology

## 2021-10-20 VITALS — BP 116/68 | HR 71 | Ht 65.0 in | Wt 180.0 lb

## 2021-10-20 DIAGNOSIS — I10 Essential (primary) hypertension: Secondary | ICD-10-CM

## 2021-10-20 DIAGNOSIS — E669 Obesity, unspecified: Secondary | ICD-10-CM | POA: Diagnosis not present

## 2021-10-20 DIAGNOSIS — E782 Mixed hyperlipidemia: Secondary | ICD-10-CM

## 2021-10-20 DIAGNOSIS — I2510 Atherosclerotic heart disease of native coronary artery without angina pectoris: Secondary | ICD-10-CM

## 2021-10-20 MED ORDER — METOPROLOL SUCCINATE ER 25 MG PO TB24: 25.0000 mg | ORAL_TABLET | Freq: Every day | ORAL | 3 refills | Status: DC

## 2021-10-20 MED ORDER — NITROGLYCERIN 0.4 MG SL SUBL: 0.4000 mg | SUBLINGUAL_TABLET | SUBLINGUAL | 11 refills | Status: DC | PRN

## 2021-10-20 MED ORDER — CLOPIDOGREL BISULFATE 75 MG PO TABS: 75.0000 mg | ORAL_TABLET | Freq: Every day | ORAL | 3 refills | Status: DC

## 2021-10-20 NOTE — Progress Notes (Signed)
Cardiology Office Note:    Date:  10/20/2021   ID:  Deborah, Stewart 1956/04/22, MRN [id number]  PCP:  Deborah Koch, MD  Cardiologist:  Deborah Lindau, MD   Referring MD: Deborah Stewart, *    ASSESSMENT:    1. Coronary artery disease involving native coronary artery of native heart without angina pectoris   2. Essential hypertension   3. Mixed hyperlipidemia   4. Obesity (BMI 30.0-34.9)    PLAN:    In order of problems listed above:  Coronary artery disease: Secondary prevention stressed with the patient.  Importance of compliance with diet and medication stressed and she vocalized understanding.  She was advised to walk at least half an hour a day 5 days a week and she promises to do so. Essential hypertension: Blood pressure stable and diet was emphasized. Mixed dyslipidemia: She is not clear why she stopped statin but she wants to restart.  She is fasting and we will do blood work today and if LFTs are fine we will restart her on statin therapy. Obesity: Weight reduction stressed diet emphasized.  Risks of obesity explained and she promises to do better.Patient will be seen in follow-up appointment in 9 months or earlier if the patient has any concerns    Medication Adjustments/Labs and Tests Ordered: Current medicines are reviewed at length with the patient today.  Concerns regarding medicines are outlined above.  No orders of the defined types were placed in this encounter.  No orders of the defined types were placed in this encounter.    No chief complaint on file.    History of Present Illness:    Deborah Stewart is a 66 y.o. female.  Patient has past medical history of coronary artery disease, essential hypertension, dyslipidemia and obesity.  She tries to stay active and walks on a regular basis.  No chest pain orthopnea or PND at the time of my evaluation, the patient is alert awake oriented and in no distress.  Past Medical History:   Diagnosis Date   Chronic back pain 07/08/2012   Pt has had lower back pain from a herniated disc for years, she is s/p cervical diskectomy.    Chronic left shoulder pain 07/15/2015   Chronic pain of left knee 07/15/2015   Coronary artery disease    10/18 PCI/DESx1 to mRCA.    DDD (degenerative disc disease), cervical    s/p diskectomy    Essential hypertension 02/01/2017   Health care maintenance 07/08/2012   Pt is being followed by a gastroenterologist for her ulcerative colitis they manage her colonoscopies.  Pt has an appointment with her OB/Gyn in July.    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   Hyperlipidemia    Hypertension    Hypothyroidism 11/10/2008   Qualifier: Diagnosis of  By: Sharlett Iles MD Cline Cools R  Chronic problem s/p partial thyroidectomy     Intermittent claudication ([place]) 07/10/2019   Lesion of vulva 09/25/2017   Numbness of fingers 05/21/2020   Obesity (BMI 30.0-34.9) 07/13/2020   Osteoporosis 08/28/2016   Personal history of colonic polyps 06/2010   hyperplastic, tubular adenoma   Pre-diabetes 11/04/2018   Right foot pain 11/18/2018   Snoring disorder 01/14/2013   Thyroid nodule    Ulcerative colitis ([place])    Vitamin D deficiency 08/30/2016    Past Surgical History:  Procedure Laterality Date   ANTERIOR CERVICAL DECOMP/DISCECTOMY FUSION  2004   Dr. Leandro Reasoner SURGERY  BREAST BIOPSY Bilateral    COLONOSCOPY     CORONARY ANGIOPLASTY WITH STENT PLACEMENT  02/06/2017   CORONARY STENT INTERVENTION N/A 02/06/2017   Procedure: CORONARY STENT INTERVENTION;  Surgeon: Belva Crome, MD;  Location: [facility];  Service: Cardiovascular;  Laterality: N/A;   FOOT SURGERY Right 09/2011    Dr. Ladora Daniel, DPO, removal of "mass"    FOOT SURGERY Left 1970s X 1   FOOT SURGERY Right 2000s "multiple"   "botched 1st time; tried to correct several times"   LEFT HEART CATH AND CORONARY ANGIOGRAPHY N/A 02/06/2017   Procedure: LEFT HEART CATH AND  CORONARY ANGIOGRAPHY;  Surgeon: Belva Crome, MD;  Location: [facility];  Service: Cardiovascular;  Laterality: N/A;   THYROIDECTOMY, PARTIAL  06/2009   "removed goiter"   TOTAL ABDOMINAL HYSTERECTOMY     TOTAL ABDOMINAL HYSTERECTOMY W/ BILATERAL SALPINGOOPHORECTOMY     Current Medications: Current Meds  Medication Sig   alendronate (FOSAMAX) 70 MG tablet Take 70 mg by mouth once a week.   amoxicillin (AMOXIL) 500 MG capsule Take 1 capsule (500 mg total) by mouth 3 (three) times daily.   Cholecalciferol 1.25 MG (50000 UT) capsule Take 1 capsule by mouth once a week.   clopidogrel (PLAVIX) 75 MG tablet TAKE 1 TABLET (75 MG TOTAL) BY MOUTH DAILY WITH BREAKFAST.   fluticasone (FLONASE) 50 MCG/ACT nasal spray Place 2 sprays into both nostrils daily.   hydrocortisone 2.5 % cream Apply topically 2 (two) times daily.   Hyoscyamine Sulfate SL 0.125 MG SUBL Place 1-2 tablets under the tongue every 4 (four) hours as needed for cramping (stomach cramps).   levothyroxine (SYNTHROID) 100 MCG tablet TAKE 1 TABLET BY MOUTH EVERY DAY BEFORE BREAKFAST   metoprolol succinate (TOPROL-XL) 25 MG 24 hr tablet TAKE 1 TABLET BY MOUTH EVERY DAY   nitroGLYCERIN (NITROSTAT) 0.4 MG SL tablet Place 1 tablet (0.4 mg total) under the tongue every 5 (five) minutes as needed for chest pain.   pantoprazole (PROTONIX) 40 MG tablet TAKE 1 TABLET BY MOUTH EVERY DAY     Allergies:   Codeine   Social History   Socioeconomic History   Marital status: Divorced    Spouse name: Not on file   Number of children: 1   Years of education: 12   Highest education level: Not on file  Occupational History   Occupation: filed Nurse, mental health: [employer]  Tobacco Use   Smoking status: Every Day    Packs/day: 0.50    Years: 45.00    Total pack years: 22.50    Types: Cigarettes    Start date: 12/31/2018    Last attempt to quit: 05/29/2019    Years since quitting: 2.3   Smokeless tobacco: Never   Tobacco comments:     taking Chantix  Vaping Use   Vaping Use: Never used  Substance and Sexual Activity   Alcohol use: Yes    Alcohol/week: 5.0 standard drinks of alcohol    Types: 5 Standard drinks or equivalent per week    Comment: 02/06/2017 "might have 1-4 mixed drinks/month"   Drug use: No   Sexual activity: Not on file  Other Topics Concern   Not on file  Social History Narrative   [employer], [employer] -BS social work. Married 1980 - 62 yrs/divorced. 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 Dating - long term relationship/[place]. Unprotected. No history of physical or sexual abuse.  Social Determinants of Health   Financial Resource Strain: Not on file  Food Insecurity: Not on file  Transportation Needs: Not on file  Physical Activity: Not on file  Stress: Not on file  Social Connections: Not on file     Family History: The patient's family history includes Breast cancer in her sister and another family member; Diabetes in her brother, father, mother, and another family member; Heart disease in her brother, father, and another family member; Hyperlipidemia in her brother; Hypertension in her brother and brother; Lung cancer in an other family member. There is no history of Colon cancer, Stomach cancer, Rectal cancer, or Esophageal cancer.  ROS:   Please see the history of present illness.    All other systems reviewed and are negative.  EKGs/Labs/Other Studies Reviewed:    The following studies were reviewed today: I discussed my findings with the patient at length.   Recent Labs: No results found for requested labs within last 365 days.  Recent Lipid Panel    Component Value Date/Time   CHOL 148 02/24/2021 1031   CHOL 148 01/14/2020 1006   TRIG 123.0 02/24/2021 1031   HDL 39.10 02/24/2021 1031   HDL 40 01/14/2020 1006   CHOLHDL 4 02/24/2021 1031   VLDL 24.6 02/24/2021 1031   LDLCALC 85 02/24/2021 1031   LDLCALC 84 01/14/2020 1006   LDLDIRECT 112.1  10/11/2012 1554    Physical Exam:    VS:  BP 116/68   Pulse 71   Ht '5\' 5"'$  (1.651 m)   Wt 180 lb (81.6 kg)   SpO2 98%   BMI 29.95 kg/m     Wt Readings from Last 3 Encounters:  10/20/21 180 lb (81.6 kg)  05/20/21 185 lb (83.9 kg)  03/16/21 180 lb 1.3 oz (81.7 kg)     GEN: Patient is in no acute distress HEENT: Normal NECK: No JVD; No carotid bruits LYMPHATICS: No lymphadenopathy CARDIAC: Hear sounds regular, 2/6 systolic murmur at the apex. RESPIRATORY:  Clear to auscultation without rales, wheezing or rhonchi  ABDOMEN: Soft, non-tender, non-distended MUSCULOSKELETAL:  No edema; No deformity  SKIN: Warm and dry NEUROLOGIC:  Alert and oriented x 3 PSYCHIATRIC:  Normal affect   Signed, Deborah Lindau, MD  10/20/2021 9:31 AM    [place]

## 2021-10-20 NOTE — Patient Instructions (Signed)
Medication Instructions:  Your physician recommends that you continue on your current medications as directed. Please refer to the Current Medication list given to you today.  *If you need a refill on your cardiac medications before your next appointment, please call your pharmacy*   Lab Work: Your physician recommends that you have labs done in the office today. Your test included  basic metabolic panel,  liver function and lipids.   [address] 2nd floor M-W 8-11:30 and 1-4:30 and Thursday and Friday 8-11:30.  If you have labs (blood work) drawn today and your tests are completely normal, you will receive your results only by: [place] (if you have MyChart) OR A paper copy in the mail If you have any lab test that is abnormal or we need to change your treatment, we will call you to review the results.   Testing/Procedures: None ordered   Follow-Up: At [facility], you and your health needs are our priority.  As part of our continuing mission to provide you with exceptional heart care, we have created designated Provider Care Teams.  These Care Teams include your primary Cardiologist (physician) and Advanced Practice Providers (APPs -  Physician Assistants and Nurse Practitioners) who all work together to provide you with the care you need, when you need it.  We recommend signing up for the patient portal called "MyChart".  Sign up information is provided on this After Visit Summary.  MyChart is used to connect with patients for Virtual Visits (Telemedicine).  Patients are able to view lab/test results, encounter notes, upcoming appointments, etc.  Non-urgent messages can be sent to your provider as well.   To learn more about what you can do with MyChart, go to [email or web address].    Your next appointment:   9 month(s)  The format for your next appointment:   In Person  Provider:   Jyl Heinz, MD   Other Instructions NA

## 2021-10-21 LAB — BASIC METABOLIC PANEL WITH GFR
BUN/Creatinine Ratio: 8 — ABNORMAL LOW (ref 12–28)
BUN: 6 mg/dL — ABNORMAL LOW (ref 8–27)
CO2: 25 mmol/L (ref 20–29)
Calcium: 9.4 mg/dL (ref 8.7–10.3)
Chloride: 103 mmol/L (ref 96–106)
Creatinine, Ser: 0.78 mg/dL (ref 0.57–1.00)
Glucose: 90 mg/dL (ref 70–99)
Potassium: 4.7 mmol/L (ref 3.5–5.2)
Sodium: 143 mmol/L (ref 134–144)
eGFR: 84 mL/min/1.73

## 2021-10-21 LAB — HEPATIC FUNCTION PANEL
ALT: 11 IU/L (ref 0–32)
AST: 15 IU/L (ref 0–40)
Albumin: 4.5 g/dL (ref 3.8–4.8)
Alkaline Phosphatase: 84 IU/L (ref 44–121)
Bilirubin Total: 0.3 mg/dL (ref 0.0–1.2)
Bilirubin, Direct: <0.1 mg/dL (ref 0.00–0.40)
Total Protein: 7.2 g/dL (ref 6.0–8.5)

## 2021-10-21 MED ORDER — ATORVASTATIN CALCIUM 80 MG PO TABS: 80.0000 mg | ORAL_TABLET | Freq: Every day | ORAL | 3 refills | Status: DC

## 2021-11-03 DIAGNOSIS — M79671 Pain in right foot: Secondary | ICD-10-CM | POA: Diagnosis not present

## 2021-11-03 DIAGNOSIS — L89892 Pressure ulcer of other site, stage 2: Secondary | ICD-10-CM | POA: Diagnosis not present

## 2021-11-04 DIAGNOSIS — Z8542 Personal history of malignant neoplasm of other parts of uterus: Secondary | ICD-10-CM | POA: Diagnosis not present

## 2021-11-04 DIAGNOSIS — E559 Vitamin D deficiency, unspecified: Secondary | ICD-10-CM | POA: Diagnosis not present

## 2021-11-04 DIAGNOSIS — Z803 Family history of malignant neoplasm of breast: Secondary | ICD-10-CM | POA: Diagnosis not present

## 2021-11-04 DIAGNOSIS — Z1379 Encounter for other screening for genetic and chromosomal anomalies: Secondary | ICD-10-CM | POA: Diagnosis not present

## 2021-11-22 ENCOUNTER — Telehealth: Payer: Self-pay | Admitting: Internal Medicine

## 2021-11-22 ENCOUNTER — Telehealth: Payer: Self-pay

## 2021-11-22 ENCOUNTER — Telehealth: Payer: Medicare HMO | Admitting: Family Medicine

## 2021-11-22 NOTE — Telephone Encounter (Signed)
LVM to call the office to set up an appointment for a AWV with Dr. Maudie Mercury virtually at any date and or time of this year due to this being the pt's 1st Medicare AWV aptmnt.

## 2021-11-22 NOTE — Progress Notes (Deleted)
  MEDICARE WELLNESS VISIT PATIENT CHECK-IN and HEALTH RISK ASSESSMENT QUESTIONNAIRE:  -completed by phone/video for upcoming Medicare Preventive Visit  Pre-Visit Check-in: 1)Vitals (height, wt, BP, etc) - record in vitals section for visit on day of visit 2)Review and Update Medications, Allergies PMH, Surgeries, Social history in Epic 3)Hospitalizations in the last year with date/reason? ***  4)Review and Update Care Team (patient's specialists) in Epic 5) Complete PHQ9 in Epic  6) Complete Fall Screening in [place] Maintenance Due and order under PCP if not done.  8)Medicare Wellness Questionnaire: Answer theses question about your habits: Do you drink alcohol? *** How many drinks do you have a day?*** Have you ever smoked?*** Have you stopped smoking and date if applicable? ***  How many packs a day do you smoke? *** Do you use smokeless tobacco?*** Do you use illicit drugs?*** Do you exercises? ***If so, what type and how many days/minutes per week?*** Are you sexually active? ***Number of partners?*** What did you eat for breakfast today (or yesterday)?*** Typical breakfast**** What did you eat for lunch today (or yesterday)? *** Typical lunch*** What did you eat for diner today (or yesterday)?*** Typical dinner*** Typical snacks:**** What beverages do you drink besides water:***  Answer theses question about you: Can you perform most household chores?*** Do you find it hard to follow a conversation in a noisy room?*** Do you find it hard to understand a speaker at church or in a meeting?*** Do you often ask people to speak up or repeat themselves?*** Do you experience ringing in your ears?*** Do you have difficulty understanding a soft or whispered voice?*** Do you feel that you have a problem with memory?*** Do you often misplace items?*** Do you balance your checkbook and or bank acounts?*** Do you feel safe at home?*** Last dentist visit?*** Do you  need assistance with any of the following:  Driving?***  Feeding yourself?***  Getting from bed to chair?***  Getting to the toilet?***  Bathing or showering?***  Dressing yourself?***  Managing money?***  Climbing a flight of stairs?***  Preparing meals?***  Do you have Advanced Directives in place (Living Will, Healthcare Power or Attorney)? ***   Last eye Exam and location?***   Do you currently use prescribed or non-prescribed narcotic or opioid pain medications?***  Do you have a history or close family history of breast, ovarian, tubal or peritoneal cancer or a family member with BRCA 1/2 (breast cancer susceptibility 1 and 2) gene mutations?  Nurse/Assistant Credentials/time stamp:***

## 2021-11-22 NOTE — Telephone Encounter (Signed)
For:  M. Scruggs-Hernandez   Pt called asking if you could please give her a call. She stated she does not have your number.  [phone]  Thank you.

## 2021-11-22 NOTE — Telephone Encounter (Signed)
Attempted to reach patient several times in regards to scheduling AWV appointment with Dr. Maudie Mercury.   Patient appointment was already made for Thursday '@11'$ :00am. Want to reach patient if patient is okay at that time or change it to a different time.   Left a voicemail to call back.

## 2021-11-22 NOTE — Telephone Encounter (Signed)
Error. Please disregard

## 2021-11-23 ENCOUNTER — Telehealth: Payer: Self-pay | Admitting: Internal Medicine

## 2021-11-23 NOTE — Telephone Encounter (Signed)
Spoke to patient today. Made her aware that video appt is schedule for Thursday with Dr. Maudie Mercury. Also inform her a nurse may call her 30 min before her appointment time get her visit ready. She said she is glad to know that because she doesn't take call from unknown number.

## 2021-11-23 NOTE — Telephone Encounter (Signed)
Returning call from [place].

## 2021-11-24 ENCOUNTER — Ambulatory Visit (INDEPENDENT_AMBULATORY_CARE_PROVIDER_SITE_OTHER): Payer: Medicare HMO | Admitting: Family Medicine

## 2021-11-24 VITALS — Ht 65.0 in | Wt 180.0 lb

## 2021-11-24 DIAGNOSIS — L89892 Pressure ulcer of other site, stage 2: Secondary | ICD-10-CM | POA: Diagnosis not present

## 2021-11-24 DIAGNOSIS — Z720 Tobacco use: Secondary | ICD-10-CM

## 2021-11-24 DIAGNOSIS — M79671 Pain in right foot: Secondary | ICD-10-CM | POA: Diagnosis not present

## 2021-11-24 DIAGNOSIS — E663 Overweight: Secondary | ICD-10-CM

## 2021-11-24 DIAGNOSIS — Z0000 Encounter for general adult medical examination without abnormal findings: Secondary | ICD-10-CM | POA: Diagnosis not present

## 2021-11-24 NOTE — Progress Notes (Addendum)
MEDICARE WELLNESS VISIT PATIENT CHECK-IN and HEALTH RISK ASSESSMENT QUESTIONNAIRE:  -completed by phone/video for upcoming Medicare Preventive Visit  Pre-Visit Check-in: 1)Vitals (height, wt, BP, etc) - record in vitals section for visit on day of visit 2)Review and Update Medications, Allergies PMH, Surgeries, Social history in Epic 3)Hospitalizations in the last year with date/reason? No  4)Review and Update Care Team (patient's specialists) in Epic 5) Complete PHQ9 in Epic  6) Complete Fall Screening in Epic 7)Review all Health Maintenance Due and order under PCP if not done.  8)Medicare Wellness Questionnaire: Answer theses question about your habits: Do you drink alcohol? Yes How many drinks do you have a day? 1-2 drinks per month Have you ever smoked?Yes Have you stopped smoking and date if applicable? No   How many packs a day do you smoke? 1 pack every other day - still smoking; she currently is trying to tackle healthy eating and then plans to try to quit smoking. 16 pack years. She did quit smoking in the past when had a heart procedure. Stress was a trigger for relapse. She also smoke weed. She wants to quit for her health and for her granddaughter to be a good role model.  She is aiming to try to quit in the next few months. She used chantix in the past.  Do you use smokeless tobacco?No Do you use illicit drugs?Yes, weed Do you exercises? Yes If so, what type and how many days/minutes per week? Walking 45 minutes 5 days per week Are you sexually active? Yes Number of partners? 0 What did you eat for breakfast today (or yesterday)?N/A  Typical breakfast N/A - doing intermittent fasting -eating more vegetables and fruits, baked some fish and seafood, less fast food and processed food. She is motivated to eat healthy as her daughter eats plant based diet.  What did you eat for lunch today (or yesterday)? Salad Typical lunch Variety of foods  What did you eat for diner today (or  yesterday)?Jerk chicken and cabbage Typical dinner Salmon and shrimp  Typical snacks Fruit - berries, cheetos and popcorn - eats rarely she says What beverages do you drink besides water: Ginger ale, home made lemonade and sweet tea - she makes her own and uses honey (does not use sugar)  Answer theses question about you: Can you perform most household chores?Yes Do you find it hard to follow a conversation in a noisy room? No Do you find it hard to understand a speaker at church or in a meeting? No Do you often ask people to speak up or repeat themselves? No Do you experience ringing in your ears? Yes - after having and ear infection earlier this year, now resolved after treatment Do you have difficulty understanding a soft or whispered voice? No Do you feel that you have a problem with memory? Yes, does not feel is impacting her life in anyway - seems normal to her - sometimes misplaces her phone: not forgetting people or places or leaving stove on etc.  Do you often misplace items? Yes - sometimes misplaces her phone Do you balance your checkbook and or bank acounts? No Do you feel safe at home? Yes Last dentist visit? 11/02/2021  Do you need assistance with any of the following:  Driving?No  Feeding yourself?No   Getting from bed to chair?No  Getting to the toilet? No  Bathing or showering?No  Dressing yourself? No  Managing money?No  Climbing a flight of stairs? No  Preparing meals? No  Do you have Advanced Directives in place (Living Will, Healthcare Power or Attorney)? No Meeting with attorney august 1st to set this up.  Last eye Exam and location? 2 years ago at [facility]   Do you currently use prescribed or non-prescribed narcotic or opioid pain medications? No  Do you have a history or close family history of breast, ovarian, tubal or peritoneal cancer or a family member with BRCA 1/2 (breast cancer susceptibility 1 and 2) gene mutations? No  Nurse/Assistant  Credentials/time stamp: MG 10:46AM    ----------------------------------------------------------------------------------------------------------------------------------------------------------------------------------------------------------------------   MEDICARE ANNUAL PREVENTIVE VISIT WITH PROVIDER: (Welcome to [employer], initial annual wellness or annual wellness exam)  Virtual Visit via Video Note  I connected with Deborah Stewart  on 11/24/2021 by telelphone and verified that I am speaking with the correct person using two identifiers.  Location patient: home Location provider:work or home office Persons participating in the virtual visit: patient, provider  Concerns and/or follow up today:  She had a heart procedure in the past which spurred her to think about pursuing a healthier lifestyle. Currently she is working on eating a healthier diet - has been incorporating more homemade meals with increased fruits and vegetables, homemade tea and seafood. She still does crave salty snacks. She also has been exercising on a regular bases - 5 days per week for 45 minutes and enjoys working in her flower gardens as well.  She has a goal to quit smoking soon - by her birthday, but is not yet ready to quit now as she wishes to focus on the healthy diet changes for a bit longer. She did quit in the past for 11 months. She feels she is strongly motivated to quit as wishes to live a long healthy life and wishes to be a good role model for her grandchild. She has cut back to 8 sigs per day. Chantix helped in the past. Trigger for relapse was stress. She feels the changes she has made to her diet have improved her energy, sense of wellbeing and overall health.    Plans to schedule her shingles vaccine.   See HM section in Epic for other details of completed HM.    ROS: negative for report of fevers, unintentional weight loss, vision changes, vision loss, hearing loss or change, chest pain, sob, hemoptysis,  melena, hematochezia, hematuria, genital discharge or lesions, falls, bleeding or bruising, loc, thoughts of suicide or self harm, memory loss  Patient-completed extensive health risk assessment - reviewed and discussed with the patient: See Health Risk Assessment completed with patient prior to the visit either above or in recent phone note. This was reviewed in detailed with the patient today and appropriate recommendations, orders and referrals were placed as needed per Summary below and patient instructions.   Review of Medical History: -PMH, PSH, Family History and current specialty and care providers reviewed and updated and listed below   Patient Care Team: Hoyt Koch, MD as PCP - General (Internal Medicine) Revankar, Reita Cliche, MD as PCP - Cardiology (Cardiology) Sable Feil, MD (Gastroenterology) Leo Grosser Seymour Bars, MD (Inactive) (Obstetrics and Gynecology)   Past Medical History:  Diagnosis Date   Chronic back pain 07/08/2012   Pt has had lower back pain from a herniated disc for years, she is s/p cervical diskectomy.    Chronic left shoulder pain 07/15/2015   Chronic pain of left knee 07/15/2015   Coronary artery disease    10/18 PCI/DESx1 to mRCA.    DDD (degenerative disc disease),  cervical    s/p diskectomy    Essential hypertension 02/01/2017   Health care maintenance 07/08/2012   Pt is being followed by a gastroenterologist for her ulcerative colitis they manage her colonoscopies.  Pt has an appointment with her OB/Gyn in July.    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   Hyperlipidemia    Hypertension    Hypothyroidism 11/10/2008   Qualifier: Diagnosis of  By: Sharlett Iles MD Cline Cools R  Chronic problem s/p partial thyroidectomy     Intermittent claudication ([place]) 07/10/2019   Lesion of vulva 09/25/2017   Numbness of fingers 05/21/2020   Obesity (BMI 30.0-34.9) 07/13/2020   Osteoporosis 08/28/2016   Personal history of colonic polyps 06/2010    hyperplastic, tubular adenoma   Pre-diabetes 11/04/2018   Right foot pain 11/18/2018   Snoring disorder 01/14/2013   Thyroid nodule    Ulcerative colitis ([place])    Vitamin D deficiency 08/30/2016    Past Surgical History:  Procedure Laterality Date   ANTERIOR CERVICAL DECOMP/DISCECTOMY FUSION  2004   Dr. Trenton Gammon   BACK SURGERY     BREAST BIOPSY Bilateral    COLONOSCOPY     CORONARY ANGIOPLASTY WITH STENT PLACEMENT  02/06/2017   CORONARY STENT INTERVENTION N/A 02/06/2017   Procedure: CORONARY STENT INTERVENTION;  Surgeon: Belva Crome, MD;  Location: [facility];  Service: Cardiovascular;  Laterality: N/A;   FOOT SURGERY Right 09/2011    Dr. Ladora Daniel, DPO, removal of "mass"    FOOT SURGERY Left 1970s X 1   FOOT SURGERY Right 2000s "multiple"   "botched 1st time; tried to correct several times"   LEFT HEART CATH AND CORONARY ANGIOGRAPHY N/A 02/06/2017   Procedure: LEFT HEART CATH AND CORONARY ANGIOGRAPHY;  Surgeon: Belva Crome, MD;  Location: [facility];  Service: Cardiovascular;  Laterality: N/A;   THYROIDECTOMY, PARTIAL  06/2009   "removed goiter"   TOTAL ABDOMINAL HYSTERECTOMY     TOTAL ABDOMINAL HYSTERECTOMY W/ BILATERAL SALPINGOOPHORECTOMY     Social History   Socioeconomic History   Marital status: Divorced    Spouse name: Not on file   Number of children: 1   Years of education: 12   Highest education level: Not on file  Occupational History   Occupation: filed Nurse, mental health: [employer]  Tobacco Use   Smoking status: Every Day    Packs/day: 0.50    Years: 45.00    Total pack years: 22.50    Types: Cigarettes    Start date: 12/31/2018    Last attempt to quit: 05/29/2019    Years since quitting: 2.4   Smokeless tobacco: Never   Tobacco comments:    taking Chantix  Vaping Use   Vaping Use: Never used  Substance and Sexual Activity   Alcohol use: Yes    Alcohol/week: 5.0 standard drinks of alcohol    Types: 5 Standard drinks  or equivalent per week    Comment: 02/06/2017 "might have 1-4 mixed drinks/month"   Drug use: No   Sexual activity: Not on file  Other Topics Concern   Not on file  Social History Narrative   [employer], [employer] -BS social work. Married 1980 - 9 yrs/divorced. 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 Dating - long term relationship/[place]. Unprotected. No history of physical or sexual abuse.    Social Determinants of Health   Financial Resource Strain: Not on file  Food Insecurity: Not on file  Transportation Needs: Not on file  Physical Activity: Not on file  Stress: Not on file  Social Connections: Not on file  Intimate Partner Violence: Not on file    Family History  Problem Relation Age of Onset   Breast cancer Sister    Diabetes Mother    Diabetes Father    Heart disease Father        CHF   Diabetes Brother    Heart disease Brother    Hypertension Brother    Hyperlipidemia Brother    Hypertension Brother    Lung cancer Other        uncle   Breast cancer Other        great aunt   Diabetes Other        grandparents   Heart disease Other        grandfather   Colon cancer Neg Hx    Stomach cancer Neg Hx    Rectal cancer Neg Hx    Esophageal cancer Neg Hx     Current Outpatient Medications on File Prior to Visit  Medication Sig Dispense Refill   alendronate (FOSAMAX) 70 MG tablet Take 70 mg by mouth once a week.     atorvastatin (LIPITOR) 80 MG tablet Take 1 tablet (80 mg total) by mouth daily. 90 tablet 3   Cholecalciferol 1.25 MG (50000 UT) capsule Take 1 capsule by mouth once a week.     clindamycin (CLINDAGEL) 1 % gel Apply 1 Application topically daily.     clopidogrel (PLAVIX) 75 MG tablet Take 1 tablet (75 mg total) by mouth daily with breakfast. 90 tablet 3   fluticasone (FLONASE) 50 MCG/ACT nasal spray Place 2 sprays into both nostrils daily. 16 g 12   hydrocortisone 2.5 % cream Apply topically 2 (two) times daily. 100 g 0    Hyoscyamine Sulfate SL 0.125 MG SUBL Place 1-2 tablets under the tongue every 4 (four) hours as needed for cramping (stomach cramps).     levothyroxine (SYNTHROID) 100 MCG tablet TAKE 1 TABLET BY MOUTH EVERY DAY BEFORE BREAKFAST 90 tablet 1   metoprolol succinate (TOPROL-XL) 25 MG 24 hr tablet Take 1 tablet (25 mg total) by mouth daily. 90 tablet 3   nitroGLYCERIN (NITROSTAT) 0.4 MG SL tablet Place 1 tablet (0.4 mg total) under the tongue every 5 (five) minutes as needed for chest pain. 25 tablet 11   pantoprazole (PROTONIX) 40 MG tablet TAKE 1 TABLET BY MOUTH EVERY DAY 90 tablet 1   No current facility-administered medications on file prior to visit.    Allergies  Allergen Reactions   Codeine     REACTION: nausea and hives       Physical Exam There were no vitals filed for this visit. Estimated body mass index is 29.95 kg/m as calculated from the following:   Height as of this encounter: '5\' 5"'  (1.651 m).   Weight as of this encounter: 180 lb (81.6 kg).  EKG (optional): deferred due to virtual visit  GENERAL: no audible sounds of distress, eye exam deferred due to telemedicine visit  LUNGS: no gross audible SOB, gasping or wheezing  PSYCH/NEURO: pleasant and cooperative, no obvious depression or anxiety, speech and thought processing grossly intact, Cognitive function grossly intact  Flowsheet Row Office Visit from 11/24/2021 in [facility] at [facility]  PHQ-9 Total Score 0           11/24/2021   10:30 AM 05/20/2021    2:41 PM 05/20/2020    8:49  AM 11/04/2018   10:19 AM 01/16/2017    8:41 AM  Depression screen PHQ 2/9  Decreased Interest 0 0 0 0 0  Down, Depressed, Hopeless 0 0 0 1 1  PHQ - 2 Score 0 0 0 1 1  Altered sleeping 0      Tired, decreased energy 0      Change in appetite 0      Feeling bad or failure about yourself  0      Trouble concentrating 0      Moving slowly or fidgety/restless 0      Suicidal thoughts 0      PHQ-9 Score 0      Difficult  doing work/chores Not difficult at all          05/20/2021    2:41 PM 07/31/2021    3:31 PM 08/28/2021    8:49 AM 11/24/2021   10:31 AM  Fall Risk  Falls in the past year? 0   0  Was there an injury with Fall? 0   0  Fall Risk Category Calculator 0   0  Fall Risk Category Low   Low  Patient Fall Risk Level  Low fall risk Low fall risk Low fall risk  Patient at Risk for Falls Due to    No Fall Risks  Fall risk Follow up    Falls evaluation completed     SUMMARY AND PLAN:  Medicare annual wellness visit, initial  Tobacco use  Overweight (BMI 25.0-29.9)    The following health maintenance/preventive care measures were recommended/discussed and the patient was referred if needed and if the patient agreeable:   Vaccines - reports will schedule her shingles vaccine, prefers to get with PCP   Mammogram -UTD   Screening Pap smear/pelvic exam  -N/a  Lung Cancer Screening: 16 pack year smoking hx, planning to quit smoking, counseled at length.   Colorectal cancer screening utd  Osteoporosis screening if applicable had dexa in 0370  Screening for glaucoma - had eye exam 2 years ago  Statin use for primary prevention - on statin  Cardiovascular screening blood tests  - utd  Diabetes screening tests - utd  Hepatitis B screening if applicable- na   Education and counseling on the following was provided based on the above review of health and a plan/checklist for the patient, along with additional information discussed, was provided for the patient in the patient instructions :  -Advised on importance of and resources for completing advanced directives - she has attorney visit scheduled to address this -Advised and counseled on maintaining healthy weight and healthy lifestyle - including the importance of a health diet, regular physical activity, social connections and stress management. -Advised and counseled on a whole foods based healthy diet at length and regular exercise:  discussed a heart healthy whole foods based diet at length. . Recommended regular exercise and discussed options within the community.  -Advised yearly dental visits at minimum and regular eye exams -Advised and counseled on alcohol, tobacco, drug, opoid use/misuse if applicable and options for help if applicable. Used MI and stages of change to address smoking. She is in the contemplation phase. Current goal to write down quitting plan, motivators, foreseeable obstacles and triggers for relapse and quit date with follow up with PCP in 1 month advised. She has quit in the past and I believe she has a good chance of quitting again before her birthday (her goal.)  Follow up: see patient instructions  Patient Instructions  CHECKLIST FOR A HEALTHY LIFE:  -Eat a healthy whole foods based diet, get regular physical activity, manage stress and engage in social connections - see below for specific suggestions and more information.  -set up a visit with your doctor in 1 month to follow up on help with quitting smoking! In the interim think of a plan - date you will stop, what issues you think may arise to make it hard for you to stop, what you plan to do to stop. Take this with you to your visit.   -Vaccines due:  -shingles vaccine   -See a dentist at least yearly  -Get your eyes checked per your eye specialist's recommendations  -Other issues addressed today: -diet, exercise   -----------------------------------------------------------------------------------------------------------------------------------------------------------------------------------------------------------------------------------------------------------   FOOD - THE FUEL FOR A HAPPY HEALTHY LIFE: Food Prescription for you: -eat real food: lots of colorful vegetables (half the plate), small amounts of fresh fruits, fish, nuts, seeds, healthy oils (such as olive oil, avocado oil or organic grass fed unsalted butter),  small portions of meat and small portions of whole grains -drink water -try to avoid fast food and pre-packaged foods  -try to avoid foods that contain any ingredients with names you do not recognize  -try to avoid sugar/sweets (except for the natural sugar that occurs in fresh fruit) -try to avoid sweet drinks -try to avoid white rice, white bread, pasta, white or yellow potatoes  MOVE - the key to keeping your body moving and working best: Exercise Prescription for you: -gradually increase intentional physical activity -move and stretch your body, legs, feet and arms when sitting for long periods -try to get at least 20 minutes of sustained activity or two 10 minute episodes of sustained activity every day at minimum  [place] - so important for health and well being -try meditating, or just sitting quietly with deep breathing while intentionally relaxing all parts of your body for 5 minutes daily  SOCIAL CONNECTIONS: -options in [place] if you wish to engage in more social activities: -Check out the [place] 50+ section on the [place] of [employer] (hiking clubs, book clubs, cards and games, chess, exercise classes, aquatic classes and much more) - see the website for details: https://www.[place].gov/departments/parks-recreation/active-adults50 -[place] (a variety of indoor and outdoor inperson activities for adults). [phone]. [address] -Virtual Online Classes (a variety of topics): see seniorplanet.org or call [phone] -consider volunteering at a school, hospice center, church, senior center or elsewhere           Lucretia Kern, DO

## 2021-11-24 NOTE — Patient Instructions (Addendum)
CHECKLIST FOR A HEALTHY LIFE:  -Eat a healthy whole foods based diet, get regular physical activity, manage stress and engage in social connections - see below for specific suggestions and more information.  -set up a visit with your doctor in 1 month to follow up on help with quitting smoking! In the interim think of a plan - date you will stop, what issues you think may arise to make it hard for you to stop, what you plan to do to stop. Take this with you to your visit.   -Vaccines due:  -shingles vaccine   -See a dentist at least yearly  -Get your eyes checked per your eye specialist's recommendations  -Other issues addressed today: -diet, exercise      FOOD - THE FUEL FOR A HAPPY HEALTHY LIFE: Food Prescription for you: -eat real food: lots of colorful vegetables (half the plate), small amounts of fresh fruits, small portions of whole grains (must say whole as the first word in the ingredients on the packaging), fish, nuts, seeds, healthy oils (such as olive oil, avocado oil or organic grass fed unsalted butter if you must have butter), small and limited portions of meat  -drink water -try to avoid fast food and pre-packaged foods  -try to avoid foods that contain any ingredients with names you do not recognize  -try to avoid sugar/sweets (except for the natural sugar that occurs in fresh fruit) -try to avoid sweet drinks -try to avoid white rice, white bread, pasta, white or yellow potatoes  MOVE - the key to keeping your body moving and working best: Exercise Prescription for you: -gradually increase intentional physical activity -move and stretch your body, legs, feet and arms when sitting for long periods -try to get at least  20 minutes of sustained activity or two 10 minute episodes of sustained activity every day at minimum  [place] - so important for health and well being -consider meditating, or just sitting quietly with deep breathing while intentionally relaxing all parts of your body for 5 minutes daily    SOCIAL CONNECTIONS: -options in [place] if you wish to engage in more social activities: -Check out the [place] 50+ section on the [place] of [employer] (hiking clubs, book clubs, cards and games, chess, exercise classes, aquatic classes and much more) - see the website for details: https://www.[place].gov/departments/parks-recreation/active-adults50 -[place] (a variety of indoor and outdoor inperson activities for adults). [phone]. [address] -Virtual Online Classes (a variety of topics): see seniorplanet.org or call ([phone] -consider volunteering at a school, hospice center, church, senior center or elsewhere

## 2021-12-14 DIAGNOSIS — L89892 Pressure ulcer of other site, stage 2: Secondary | ICD-10-CM | POA: Diagnosis not present

## 2021-12-14 DIAGNOSIS — M79671 Pain in right foot: Secondary | ICD-10-CM | POA: Diagnosis not present

## 2021-12-19 ENCOUNTER — Ambulatory Visit: Payer: Medicare HMO | Admitting: Family Medicine

## 2022-01-12 DIAGNOSIS — L89892 Pressure ulcer of other site, stage 2: Secondary | ICD-10-CM | POA: Diagnosis not present

## 2022-01-12 DIAGNOSIS — M79671 Pain in right foot: Secondary | ICD-10-CM | POA: Diagnosis not present

## 2022-02-09 DIAGNOSIS — L89892 Pressure ulcer of other site, stage 2: Secondary | ICD-10-CM | POA: Diagnosis not present

## 2022-02-09 DIAGNOSIS — M79671 Pain in right foot: Secondary | ICD-10-CM | POA: Diagnosis not present

## 2022-03-01 DIAGNOSIS — M79671 Pain in right foot: Secondary | ICD-10-CM | POA: Diagnosis not present

## 2022-03-01 DIAGNOSIS — L89892 Pressure ulcer of other site, stage 2: Secondary | ICD-10-CM | POA: Diagnosis not present

## 2022-03-22 DIAGNOSIS — L89892 Pressure ulcer of other site, stage 2: Secondary | ICD-10-CM | POA: Diagnosis not present

## 2022-03-22 DIAGNOSIS — M79671 Pain in right foot: Secondary | ICD-10-CM | POA: Diagnosis not present

## 2022-04-02 ENCOUNTER — Other Ambulatory Visit: Payer: Self-pay | Admitting: Internal Medicine

## 2022-04-03 ENCOUNTER — Telehealth: Payer: Self-pay | Admitting: Internal Medicine

## 2022-04-03 ENCOUNTER — Ambulatory Visit
Admission: EM | Admit: 2022-04-03 | Discharge: 2022-04-03 | Disposition: A | Discharge status: Home / Self Care | Payer: Medicare HMO | Attending: Physician Assistant | Admitting: Physician Assistant

## 2022-04-03 ENCOUNTER — Ambulatory Visit (INDEPENDENT_AMBULATORY_CARE_PROVIDER_SITE_OTHER): Payer: Medicare HMO

## 2022-04-03 DIAGNOSIS — R911 Solitary pulmonary nodule: Secondary | ICD-10-CM | POA: Diagnosis not present

## 2022-04-03 DIAGNOSIS — R059 Cough, unspecified: Secondary | ICD-10-CM

## 2022-04-03 MED ORDER — BENZONATATE 100 MG PO CAPS
100.0000 mg | ORAL_CAPSULE | Freq: Four times a day (QID) | ORAL | 0 refills | Status: DC | PRN
Start: 2022-04-03 — End: 2022-09-05

## 2022-04-03 NOTE — Telephone Encounter (Signed)
We can try to order the CT scan without visit if she wants but insurance may require she have a visit with [place] to order. Does she want to set up visit or have [place] try to order without?

## 2022-04-03 NOTE — Telephone Encounter (Signed)
PT calls today in regards to recent x-ray done at urgent care. PT was seen with urgent care due to a month long constant cough, described as wet but PT could not produce any phlegm.  They proceeded to due an x-ray at the facility and in doing so have found a lump on PT's lung. They advised PT to get up with Dr.Crawford to set up a referral for a CT.  CB for PT: [phone]

## 2022-04-03 NOTE — ED Provider Notes (Signed)
[name]    CSN: [id number] Arrival date & time: 04/03/22  3664      History   Chief Complaint Chief Complaint  Patient presents with   Cough    HPI Deborah Stewart is a 66 y.o. female.   Patient complains of a cough for the past 6 months.  Patient denies any fever or chills.  Patient denies any congestion.  Patient reports cough seems worse at night.  Patient denies any known exposure to flu or COVID.  Patient is a smoker.  The history is provided by the patient. No language interpreter was used.  Cough Cough characteristics:  Non-productive Sputum characteristics:  Nondescript Severity:  Moderate Timing:  Constant Progression:  Worsening Chronicity:  New Relieved by:  Nothing Worsened by:  Nothing   Past Medical History:  Diagnosis Date   Chronic back pain 07/08/2012   Pt has had lower back pain from a herniated disc for years, she is s/p cervical diskectomy.    Chronic left shoulder pain 07/15/2015   Chronic pain of left knee 07/15/2015   Coronary artery disease    10/18 PCI/DESx1 to mRCA.    DDD (degenerative disc disease), cervical    s/p diskectomy    Essential hypertension 02/01/2017   Health care maintenance 07/08/2012   Pt is being followed by a gastroenterologist for her ulcerative colitis they manage her colonoscopies.  Pt has an appointment with her OB/Gyn in July.    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   Hyperlipidemia    Hypertension    Hypothyroidism 11/10/2008   Qualifier: Diagnosis of  By: Sharlett Iles MD Cline Cools R  Chronic problem s/p partial thyroidectomy     Intermittent claudication ([place]) 07/10/2019   Lesion of vulva 09/25/2017   Numbness of fingers 05/21/2020   Obesity (BMI 30.0-34.9) 07/13/2020   Osteoporosis 08/28/2016   Personal history of colonic polyps 06/2010   hyperplastic, tubular adenoma   Pre-diabetes 11/04/2018   Right foot pain 11/18/2018   Snoring disorder 01/14/2013   Thyroid nodule    Ulcerative  colitis ([place])    Vitamin D deficiency 08/30/2016    Patient Active Problem List   Diagnosis Date Noted   Obesity (BMI 30.0-34.9) 07/13/2020   Numbness of fingers 05/21/2020   Coronary artery disease    DDD (degenerative disc disease), cervical    Hyperlipidemia    Hypertension    Thyroid nodule    Ulcerative colitis ([place])    Intermittent claudication ([place]) 07/10/2019   Right foot pain 11/18/2018   Pre-diabetes 11/04/2018   Lesion of vulva 09/25/2017   Essential hypertension 02/01/2017   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   Vitamin D deficiency 08/30/2016   Osteoporosis 08/28/2016   Chronic left shoulder pain 07/15/2015   Chronic pain of left knee 07/15/2015   Snoring disorder 01/14/2013   Health care maintenance 07/08/2012   Chronic back pain 07/08/2012   Personal history of colonic polyps 06/2010   Hypothyroidism 11/10/2008    Past Surgical History:  Procedure Laterality Date   ANTERIOR CERVICAL DECOMP/DISCECTOMY FUSION  2004   Dr. Leandro Reasoner SURGERY     BREAST BIOPSY Bilateral    COLONOSCOPY     CORONARY ANGIOPLASTY WITH STENT PLACEMENT  02/06/2017   CORONARY STENT INTERVENTION N/A 02/06/2017   Procedure: CORONARY STENT INTERVENTION;  Surgeon: Belva Crome, MD;  Location: [facility];  Service: Cardiovascular;  Laterality: N/A;   FOOT SURGERY Right 09/2011    Dr. Delfino Lovett  [place], DPO, removal of "mass"    FOOT SURGERY Left 1970s X 1   FOOT SURGERY Right 2000s "multiple"   "botched 1st time; tried to correct several times"   LEFT HEART CATH AND CORONARY ANGIOGRAPHY N/A 02/06/2017   Procedure: LEFT HEART CATH AND CORONARY ANGIOGRAPHY;  Surgeon: Belva Crome, MD;  Location: [facility];  Service: Cardiovascular;  Laterality: N/A;   THYROIDECTOMY, PARTIAL  06/2009   "removed goiter"   TOTAL ABDOMINAL HYSTERECTOMY     TOTAL ABDOMINAL HYSTERECTOMY W/ BILATERAL SALPINGOOPHORECTOMY     OB History     Gravida  1   Para  1   Term       Preterm      AB      Living         SAB      IAB      Ectopic      Multiple      Live Births               Home Medications    Prior to Admission medications   Medication Sig Start Date End Date Taking? Authorizing Provider  benzonatate (TESSALON PERLES) 100 MG capsule Take 1 capsule (100 mg total) by mouth every 6 (six) hours as needed for cough. 04/03/22 04/03/23 Yes Fransico Meadow, PA-C  alendronate (FOSAMAX) 70 MG tablet Take 70 mg by mouth once a week. 12/09/20   [provider]  atorvastatin (LIPITOR) 80 MG tablet Take 1 tablet (80 mg total) by mouth daily. 10/21/21   Revankar, Reita Cliche, MD  Cholecalciferol 1.25 MG (50000 UT) capsule Take 1 capsule by mouth once a week.    [provider]  clindamycin (CLINDAGEL) 1 % gel Apply 1 Application topically daily. 10/19/21   [provider]  clopidogrel (PLAVIX) 75 MG tablet Take 1 tablet (75 mg total) by mouth daily with breakfast. 10/20/21   Revankar, Reita Cliche, MD  fluticasone (FLONASE) 50 MCG/ACT nasal spray Place 2 sprays into both nostrils daily. 08/28/21   Jaynee Eagles, PA-C  hydrocortisone 2.5 % cream Apply topically 2 (two) times daily. 05/20/21   Hoyt Koch, MD  Hyoscyamine Sulfate SL 0.125 MG SUBL Place 1-2 tablets under the tongue every 4 (four) hours as needed for cramping (stomach cramps).    [provider]  levothyroxine (SYNTHROID) 100 MCG tablet TAKE 1 TABLET BY MOUTH EVERY DAY BEFORE BREAKFAST 04/03/22   Hoyt Koch, MD  metoprolol succinate (TOPROL-XL) 25 MG 24 hr tablet Take 1 tablet (25 mg total) by mouth daily. 10/20/21   Revankar, Reita Cliche, MD  nitroGLYCERIN (NITROSTAT) 0.4 MG SL tablet Place 1 tablet (0.4 mg total) under the tongue every 5 (five) minutes as needed for chest pain. 10/20/21   Revankar, Reita Cliche, MD  pantoprazole (PROTONIX) 40 MG tablet TAKE 1 TABLET BY MOUTH EVERY DAY 07/11/21   Hoyt Koch, MD    Family History Family History   Problem Relation Age of Onset   Breast cancer Sister    Diabetes Mother    Diabetes Father    Heart disease Father        CHF   Diabetes Brother    Heart disease Brother    Hypertension Brother    Hyperlipidemia Brother    Hypertension Brother    Lung cancer Other        uncle   Breast cancer Other        great aunt   Diabetes  Other        grandparents   Heart disease Other        grandfather   Colon cancer Neg Hx    Stomach cancer Neg Hx    Rectal cancer Neg Hx    Esophageal cancer Neg Hx     Social History Social History   Tobacco Use   Smoking status: Every Day    Packs/day: 0.50    Years: 45.00    Total pack years: 22.50    Types: Cigarettes    Start date: 12/31/2018    Last attempt to quit: 05/29/2019    Years since quitting: 2.8   Smokeless tobacco: Never   Tobacco comments:    taking Chantix  Vaping Use   Vaping Use: Never used  Substance Use Topics   Alcohol use: Yes    Alcohol/week: 5.0 standard drinks of alcohol    Types: 5 Standard drinks or equivalent per week    Comment: 02/06/2017 "might have 1-4 mixed drinks/month"   Drug use: No     Allergies   Codeine   Review of Systems Review of Systems  Respiratory:  Positive for cough.   All other systems reviewed and are negative.    Physical Exam Triage Vital Signs ED Triage Vitals  Enc Vitals Group     BP 04/03/22 1015 127/88     Pulse Rate 04/03/22 1015 68     Resp 04/03/22 1015 16     Temp 04/03/22 1015 98.3 F (36.8 C)     Temp Source 04/03/22 1015 Oral     SpO2 04/03/22 1015 96 %     Weight --      Height --      Head Circumference --      Peak Flow --      Pain Score 04/03/22 1016 0     Pain Loc --      Pain Edu? --      Excl. in [place]? --    No data found.  Updated Vital Signs BP 127/88 (BP Location: Left Arm)   Pulse 68   Temp 98.3 F (36.8 C) (Oral)   Resp 16   SpO2 96%   Visual Acuity Right Eye Distance:   Left Eye Distance:   Bilateral Distance:    Right Eye  Near:   Left Eye Near:    Bilateral Near:     Physical Exam Vitals and nursing note reviewed.  Constitutional:      Appearance: She is well-developed.  HENT:     Head: Normocephalic.     Nose: Nose normal.  Cardiovascular:     Rate and Rhythm: Normal rate.  Pulmonary:     Effort: Pulmonary effort is normal.  Abdominal:     General: Abdomen is flat. There is no distension.  Musculoskeletal:        General: Normal range of motion.     Cervical back: Normal range of motion.  Neurological:     Mental Status: She is alert and oriented to person, place, and time.      UC Treatments / Results  Labs (all labs ordered are listed, but only abnormal results are displayed) Labs Reviewed - No data to display  EKG   Radiology DG Chest 2 View  Result Date: 04/03/2022 CLINICAL DATA:  Cough for 1 month. EXAM: CHEST - 2 VIEW COMPARISON:  02/01/2017 FINDINGS: Heart size and mediastinal contours are unremarkable. No pleural effusion or edema. No airspace opacities identified. New nodular  density in the left upper lobe measures 1 cm. Status post ACDF within the lower cervical spine. Mild thoracolumbar scoliosis. IMPRESSION: 1. No acute cardiopulmonary abnormalities. 2. New 1 cm nodular density in the left upper lobe. Recommend further evaluation with CT of the chest. Electronically Signed   By: Kerby Moors M.D.   On: 04/03/2022 10:52    Procedures Procedures (including critical care time)  Medications Ordered in UC Medications - No data to display  Initial Impression / Assessment and Plan / UC Course  I have reviewed the triage vital signs and the nursing notes.  Pertinent labs & imaging results that were available during my care of the patient were reviewed by me and considered in my medical decision making (see chart for details).     AM: Chest x-ray shows a new nodular density in the left upper lobe which measures 1 cm radiologist recommended CT for further evaluation.  I  messaged patient's primary care physician or Pricilla Holm, MD who will schedule follow-up Final Clinical Impressions(s) / UC Diagnoses   Final diagnoses:  Solitary pulmonary nodule     Discharge Instructions      You will need to be scheduled for a ct scan.  Try claritin or zyrtec for symptoms    ED Prescriptions     Medication Sig Dispense Auth. Provider   benzonatate (TESSALON PERLES) 100 MG capsule Take 1 capsule (100 mg total) by mouth every 6 (six) hours as needed for cough. 30 capsule Fransico Meadow, [place]      PDMP not reviewed this encounter. An After Visit Summary was printed and given to the patient.    Fransico Meadow, [place] 04/03/22 1347

## 2022-04-03 NOTE — Discharge Instructions (Addendum)
You will need to be scheduled for a ct scan.  Try claritin or zyrtec for symptoms

## 2022-04-03 NOTE — ED Triage Notes (Signed)
Pt c/o cough not resolving after ~ 1 month.   Spo2 93-94% until coughs then 95-96%

## 2022-04-10 ENCOUNTER — Ambulatory Visit (INDEPENDENT_AMBULATORY_CARE_PROVIDER_SITE_OTHER): Payer: Medicare HMO | Admitting: Internal Medicine

## 2022-04-10 ENCOUNTER — Other Ambulatory Visit: Payer: Self-pay | Admitting: Internal Medicine

## 2022-04-10 ENCOUNTER — Encounter: Payer: Self-pay | Admitting: Internal Medicine

## 2022-04-10 VITALS — BP 120/80 | HR 60 | Temp 98.2°F | Ht 65.0 in | Wt 166.0 lb

## 2022-04-10 DIAGNOSIS — I2510 Atherosclerotic heart disease of native coronary artery without angina pectoris: Secondary | ICD-10-CM | POA: Diagnosis not present

## 2022-04-10 DIAGNOSIS — I10 Essential (primary) hypertension: Secondary | ICD-10-CM | POA: Diagnosis not present

## 2022-04-10 DIAGNOSIS — M810 Age-related osteoporosis without current pathological fracture: Secondary | ICD-10-CM

## 2022-04-10 DIAGNOSIS — R7303 Prediabetes: Secondary | ICD-10-CM

## 2022-04-10 DIAGNOSIS — E782 Mixed hyperlipidemia: Secondary | ICD-10-CM | POA: Diagnosis not present

## 2022-04-10 DIAGNOSIS — R911 Solitary pulmonary nodule: Secondary | ICD-10-CM

## 2022-04-10 DIAGNOSIS — Z720 Tobacco use: Secondary | ICD-10-CM | POA: Diagnosis not present

## 2022-04-10 DIAGNOSIS — E039 Hypothyroidism, unspecified: Secondary | ICD-10-CM

## 2022-04-10 HISTORY — DX: Solitary pulmonary nodule: R91.1

## 2022-04-10 LAB — CBC
HCT: 48.2 % — ABNORMAL HIGH (ref 36.0–46.0)
Hemoglobin: 16.2 g/dL — ABNORMAL HIGH (ref 12.0–15.0)
MCHC: 33.6 g/dL (ref 30.0–36.0)
MCV: 92 fl (ref 78.0–100.0)
Platelets: 326 10*3/uL (ref 150.0–400.0)
RBC: 5.24 Mil/uL — ABNORMAL HIGH (ref 3.87–5.11)
RDW: 14.3 % (ref 11.5–15.5)
WBC: 9.7 10*3/uL (ref 4.0–10.5)

## 2022-04-10 LAB — COMPREHENSIVE METABOLIC PANEL
ALT: 15 U/L (ref 0–35)
AST: 19 U/L (ref 0–37)
Albumin: 4.3 g/dL (ref 3.5–5.2)
Alkaline Phosphatase: 74 U/L (ref 39–117)
BUN: 10 mg/dL (ref 6–23)
CO2: 29 mEq/L (ref 19–32)
Calcium: 9.5 mg/dL (ref 8.4–10.5)
Chloride: 103 mEq/L (ref 96–112)
Creatinine, Ser: 0.68 mg/dL (ref 0.40–1.20)
GFR: 90.9 mL/min (ref >60.00)
Glucose, Bld: 84 mg/dL (ref 70–99)
Potassium: 4.6 mEq/L (ref 3.5–5.1)
Sodium: 141 mEq/L (ref 135–145)
Total Bilirubin: 0.7 mg/dL (ref 0.2–1.2)
Total Protein: 7.4 g/dL (ref 6.0–8.3)

## 2022-04-10 LAB — LIPID PANEL
Cholesterol: 140 mg/dL (ref 0–200)
HDL: 40.1 mg/dL (ref >39.00)
LDL Cholesterol: 77 mg/dL (ref 0–99)
NonHDL: 100.2
Total CHOL/HDL Ratio: 3
Triglycerides: 117 mg/dL (ref 0.0–149.0)
VLDL: 23.4 mg/dL (ref 0.0–40.0)

## 2022-04-10 LAB — VITAMIN D 25 HYDROXY (VIT D DEFICIENCY, FRACTURES): VITD: 26.92 ng/mL — ABNORMAL LOW (ref 30.00–100.00)

## 2022-04-10 LAB — HEMOGLOBIN A1C: Hgb A1c MFr Bld: 6.4 % (ref 4.6–6.5)

## 2022-04-10 LAB — TSH: TSH: 0.7 u[IU]/mL (ref 0.35–5.50)

## 2022-04-10 MED ORDER — VARENICLINE TARTRATE (STARTER) 0.5 MG X 11 & 1 MG X 42 PO TBPK: ORAL_TABLET | ORAL | 0 refills | Status: AC

## 2022-04-10 MED ORDER — VARENICLINE TARTRATE 1 MG PO TABS: 1.0000 mg | ORAL_TABLET | Freq: Two times a day (BID) | ORAL | 6 refills | Status: DC

## 2022-04-10 MED ORDER — VITAMIN D (ERGOCALCIFEROL) 1.25 MG (50000 UNIT) PO CAPS: 50000.0000 [IU] | ORAL_CAPSULE | ORAL | 0 refills | Status: DC

## 2022-04-10 NOTE — Progress Notes (Signed)
   Subjective:   Patient ID: Deborah Stewart, female    DOB: Jan 04, 1956, 66 y.o.   MRN: [id number]  HPI The patient is a 66 YO female coming in for follow up nodule found on x-ray at urgent care. Still has cough.  Review of Systems  Constitutional: Negative.   HENT: Negative.    Eyes: Negative.   Respiratory:  Positive for cough. Negative for chest tightness and shortness of breath.   Cardiovascular:  Negative for chest pain, palpitations and leg swelling.  Gastrointestinal:  Negative for abdominal distention, abdominal pain, constipation, diarrhea, nausea and vomiting.  Musculoskeletal: Negative.   Skin: Negative.   Neurological: Negative.   Psychiatric/Behavioral: Negative.      Objective:  Physical Exam Constitutional:      Appearance: She is well-developed.  HENT:     Head: Normocephalic and atraumatic.  Cardiovascular:     Rate and Rhythm: Normal rate and regular rhythm.  Pulmonary:     Effort: Pulmonary effort is normal. No respiratory distress.     Breath sounds: Rhonchi present. No wheezing or rales.     Comments: Mild rhonchi clear with cough Abdominal:     General: Bowel sounds are normal. There is no distension.     Palpations: Abdomen is soft.     Tenderness: There is no abdominal tenderness. There is no rebound.  Musculoskeletal:     Cervical back: Normal range of motion.  Skin:    General: Skin is warm and dry.  Neurological:     Mental Status: She is alert and oriented to person, place, and time.     Coordination: Coordination normal.     Vitals:   04/10/22 0922  BP: 120/80  Pulse: 60  Temp: 98.2 F (36.8 C)  TempSrc: Oral  SpO2: 98%  Weight: 166 lb (75.3 kg)  Height: '5\' 5"'$  (1.651 m)    Assessment & Plan:  Visit time 25 minutes in face to face communication with patient and coordination of care, additional 5 minutes spent in record review, coordination or care, ordering tests, communicating/referring to other healthcare professionals,  documenting in medical records all on the same day of the visit for total time 30 minutes spent on the visit.

## 2022-04-10 NOTE — Assessment & Plan Note (Signed)
Detected on x-ray at urgent care. Ordered stat CT to assess 1 cm nodule. She is having chronic cough about 2-3 months and is current smoker. Wants to try to quit.

## 2022-04-10 NOTE — Assessment & Plan Note (Signed)
BP at goal, checking CMP and lipid panel. Adjust as needed her metoprolol 25 mg daily.

## 2022-04-10 NOTE — Assessment & Plan Note (Signed)
No new chest pain symptoms. BP at goal. Checking CMP and lipid panel and adjust atorvastatin 80 mg daily as needed. Counseled to quit smoking.

## 2022-04-10 NOTE — Assessment & Plan Note (Signed)
Checking TSH and adjust synthroid 100 mcg daily as needed.  

## 2022-04-10 NOTE — Patient Instructions (Addendum)
We will check the labs today.  We will get the ct scan of the lungs.  We have sent in the chantix to take to help stop smoking.

## 2022-04-10 NOTE — Assessment & Plan Note (Signed)
Checking lipid panel and adjust as needed lipitor 80 mg daily.

## 2022-04-10 NOTE — Assessment & Plan Note (Signed)
Does want to stop smoking. Rx chantix starting and continuing doses. Counseled on use. She is under assessment for new lung nodule currently so not appropriate for lung cancer screening today.

## 2022-04-10 NOTE — Assessment & Plan Note (Signed)
Checking vitamin D level as she is taking high dose replacement 50000 units weekly. If normal or low will refill. If high will stop and resume 2000 IU daily.

## 2022-04-14 ENCOUNTER — Ambulatory Visit
Admission: RE | Admit: 2022-04-14 | Discharge: 2022-04-14 | Disposition: A | Discharge status: Home / Self Care | Payer: Medicare HMO | Source: Ambulatory Visit | Attending: Internal Medicine | Admitting: Internal Medicine

## 2022-04-14 DIAGNOSIS — R911 Solitary pulmonary nodule: Secondary | ICD-10-CM | POA: Diagnosis not present

## 2022-04-14 DIAGNOSIS — I700 Atherosclerosis of aorta: Secondary | ICD-10-CM | POA: Diagnosis not present

## 2022-04-14 DIAGNOSIS — J439 Emphysema, unspecified: Secondary | ICD-10-CM | POA: Diagnosis not present

## 2022-04-19 ENCOUNTER — Other Ambulatory Visit: Payer: Self-pay | Admitting: Internal Medicine

## 2022-04-21 DIAGNOSIS — M79671 Pain in right foot: Secondary | ICD-10-CM | POA: Diagnosis not present

## 2022-04-21 DIAGNOSIS — L89892 Pressure ulcer of other site, stage 2: Secondary | ICD-10-CM | POA: Diagnosis not present

## 2022-05-08 ENCOUNTER — Other Ambulatory Visit: Payer: Self-pay | Admitting: Internal Medicine

## 2022-05-10 DIAGNOSIS — M79671 Pain in right foot: Secondary | ICD-10-CM | POA: Diagnosis not present

## 2022-05-10 DIAGNOSIS — L89892 Pressure ulcer of other site, stage 2: Secondary | ICD-10-CM | POA: Diagnosis not present

## 2022-06-07 DIAGNOSIS — L89892 Pressure ulcer of other site, stage 2: Secondary | ICD-10-CM | POA: Diagnosis not present

## 2022-06-07 DIAGNOSIS — M79671 Pain in right foot: Secondary | ICD-10-CM | POA: Diagnosis not present

## 2022-06-28 DIAGNOSIS — M79671 Pain in right foot: Secondary | ICD-10-CM | POA: Diagnosis not present

## 2022-06-28 DIAGNOSIS — L89892 Pressure ulcer of other site, stage 2: Secondary | ICD-10-CM | POA: Diagnosis not present

## 2022-07-24 ENCOUNTER — Other Ambulatory Visit: Payer: Self-pay | Admitting: Internal Medicine

## 2022-07-24 NOTE — Telephone Encounter (Signed)
Pls advise if ok for refill../lmb 

## 2022-07-26 DIAGNOSIS — M79671 Pain in right foot: Secondary | ICD-10-CM | POA: Diagnosis not present

## 2022-07-26 DIAGNOSIS — L89892 Pressure ulcer of other site, stage 2: Secondary | ICD-10-CM | POA: Diagnosis not present

## 2022-08-08 ENCOUNTER — Other Ambulatory Visit: Payer: Self-pay | Admitting: Internal Medicine

## 2022-08-23 DIAGNOSIS — M79671 Pain in right foot: Secondary | ICD-10-CM | POA: Diagnosis not present

## 2022-08-23 DIAGNOSIS — L84 Corns and callosities: Secondary | ICD-10-CM | POA: Diagnosis not present

## 2022-08-23 DIAGNOSIS — M79672 Pain in left foot: Secondary | ICD-10-CM | POA: Diagnosis not present

## 2022-08-23 DIAGNOSIS — L602 Onychogryphosis: Secondary | ICD-10-CM | POA: Diagnosis not present

## 2022-08-23 DIAGNOSIS — L89892 Pressure ulcer of other site, stage 2: Secondary | ICD-10-CM | POA: Diagnosis not present

## 2022-08-29 ENCOUNTER — Other Ambulatory Visit: Payer: Self-pay | Admitting: Internal Medicine

## 2022-09-05 ENCOUNTER — Ambulatory Visit: Payer: Medicare HMO | Attending: Cardiology | Admitting: Cardiology

## 2022-09-05 ENCOUNTER — Encounter: Payer: Self-pay | Admitting: Cardiology

## 2022-09-05 VITALS — BP 114/66 | HR 70 | Ht 65.0 in | Wt 177.0 lb

## 2022-09-05 DIAGNOSIS — E669 Obesity, unspecified: Secondary | ICD-10-CM | POA: Diagnosis not present

## 2022-09-05 DIAGNOSIS — E782 Mixed hyperlipidemia: Secondary | ICD-10-CM

## 2022-09-05 DIAGNOSIS — I10 Essential (primary) hypertension: Secondary | ICD-10-CM | POA: Diagnosis not present

## 2022-09-05 DIAGNOSIS — I2510 Atherosclerotic heart disease of native coronary artery without angina pectoris: Secondary | ICD-10-CM | POA: Diagnosis not present

## 2022-09-05 DIAGNOSIS — F17210 Nicotine dependence, cigarettes, uncomplicated: Secondary | ICD-10-CM

## 2022-09-05 MED ORDER — NITROGLYCERIN 0.4 MG SL SUBL: 0.4000 mg | SUBLINGUAL_TABLET | SUBLINGUAL | 11 refills | Status: AC | PRN

## 2022-09-05 MED ORDER — ATORVASTATIN CALCIUM 80 MG PO TABS: 80.0000 mg | ORAL_TABLET | Freq: Every day | ORAL | 3 refills | Status: DC

## 2022-09-05 NOTE — Patient Instructions (Signed)
Medication Instructions:  Your physician recommends that you continue on your current medications as directed. Please refer to the Current Medication list given to you today.  *If you need a refill on your cardiac medications before your next appointment, please call your pharmacy*   Lab Work: None ordered If you have labs (blood work) drawn today and your tests are completely normal, you will receive your results only by: MyChart Message (if you have MyChart) OR A paper copy in the mail If you have any lab test that is abnormal or we need to change your treatment, we will call you to review the results.   Testing/Procedures: You are scheduled for a Myocardial Perfusion Imaging Study.  Please arrive 15 minutes prior to your appointment time for registration and insurance purposes.  The test will take approximately 3 to 4 hours to complete; you may bring reading material.  If someone comes with you to your appointment, they will need to remain in the main lobby due to limited space in the testing area.   How to prepare for your Myocardial Perfusion Test: Do not eat or drink 3 hours prior to your test, except you may have water. Do not consume products containing caffeine (regular or decaffeinated) 12 hours prior to your test. (ex: coffee, chocolate, sodas, tea). Do bring a list of your current medications with you.  If not listed below, you may take your medications as normal. Do not take metoprolol (Lopressor, Toprol) for 24 hours prior to the test.  Bring the medication to your appointment as you may be required to take it once the test is complete. Do wear comfortable clothes (no dresses or overalls) and walking shoes, tennis shoes preferred (No heels or open toe shoes are allowed). Do NOT wear cologne, perfume, aftershave, or lotions (deodorant is allowed). If these instructions are not followed, your test will have to be rescheduled.  If you cannot keep your appointment, please  provide 24 hours notification to the Nuclear Lab, to avoid a possible $50 charge to your account.  Follow-Up: At [facility], you and your health needs are our priority.  As part of our continuing mission to provide you with exceptional heart care, we have created designated Provider Care Teams.  These Care Teams include your primary Cardiologist (physician) and Advanced Practice Providers (APPs -  Physician Assistants and Nurse Practitioners) who all work together to provide you with the care you need, when you need it.  We recommend signing up for the patient portal called "MyChart".  Sign up information is provided on this After Visit Summary.  MyChart is used to connect with patients for Virtual Visits (Telemedicine).  Patients are able to view lab/test results, encounter notes, upcoming appointments, etc.  Non-urgent messages can be sent to your provider as well.   To learn more about what you can do with MyChart, go to [email or web address].    Your next appointment:   9 month(s)  Provider:   Rajan Revankar, MD   Other Instructions  Cardiac Nuclear Scan A cardiac nuclear scan is a test that is done to check the flow of blood to your heart. It is done when you are resting and when you are exercising. The test looks for problems such as: Not enough blood reaching a portion of the heart. The heart muscle not working as it should. You may need this test if you have: Heart disease. Lab results that are not normal. Had heart surgery or a balloon procedure to   open up blocked arteries (angioplasty) or a small mesh tube (stent). Chest pain. Shortness of breath. Had a heart attack. In this test, a special dye (tracer) is put into your bloodstream. The tracer will travel to your heart. A camera will then take pictures of your heart to see how the tracer moves through your heart. This test is usually done at a hospital and takes 2-4 hours. Tell a doctor about: Any allergies you  have. All medicines you are taking, including vitamins, herbs, eye drops, creams, and over-the-counter medicines. Any bleeding problems you have. Any surgeries you have had. Any medical conditions you have. Whether you are pregnant or may be pregnant. Any history of asthma or long-term (chronic) lung disease. Any history of heart rhythm disorders or heart valve conditions. What are the risks? Your doctor will talk with you about risks. These may include: Serious chest pain and heart attack. This is only a risk if the stress portion of the test is done. Fast or uneven heartbeats (palpitations). A feeling of warmth in your chest. This feeling usually does not last long. Allergic reaction to the tracer. Shortness of breath or trouble breathing. What happens before the test? Ask your doctor about changing or stopping your normal medicines. Follow instructions from your doctor about what you cannot eat or drink. Remove your jewelry on the day of the test. Ask your doctor if you need to avoid nicotine or caffeine. What happens during the test? An IV tube will be inserted into one of your veins. Your doctor will give you a small amount of tracer through the IV tube. You will wait for 20-40 minutes while the tracer moves through your bloodstream. Your heart will be monitored with an electrocardiogram (ECG). You will lie down on an exam table. Pictures of your heart will be taken for about 15-20 minutes. You may also have a stress test. For this test, one of these things may be done: You will be asked to exercise on a treadmill or a stationary bike. You will be given medicines that will make your heart work harder. This is done if you are unable to exercise. When blood flow to your heart has peaked, a tracer will again be given through the IV tube. After 20-40 minutes, you will get back on the exam table. More pictures will be taken of your heart. Depending on the tracer that is used, more  pictures may need to be taken 3-4 hours later. Your IV tube will be removed when the test is over. The test may vary among doctors and hospitals. What happens after the test? Ask your doctor: Whether you can return to your normal schedule, including diet, activities, travel, and medicines. Whether you should drink more fluids. This will help to remove the tracer from your body. Ask your doctor, or the department that is doing the test: When will my results be ready? How will I get my results? What are my treatment options? What other tests do I need? What are my next steps? This information is not intended to replace advice given to you by your health care provider. Make sure you discuss any questions you have with your health care provider. Document Revised: 09/13/2021 Document Reviewed: 09/13/2021 Elsevier Patient Education  2023 [employer]  

## 2022-09-05 NOTE — Progress Notes (Signed)
Cardiology Office Note:    Date:  09/05/2022   ID:  [name], [name] 1955/05/19, MRN [id number]  PCP:  Myrlene Broker, MD  Cardiologist:  Garwin Brothers, MD   Referring MD: Myrlene Broker, *    ASSESSMENT:    1. Essential hypertension   2. Mixed hyperlipidemia   3. Coronary artery disease involving native coronary artery of native heart without angina pectoris   4. Obesity (BMI 30.0-34.9)    PLAN:    In order of problems listed above:  Coronary artery disease: Chest pain atypical for coronary etiology: In view of this I discussed with her about the possibility of getting exercise stress Cardiolite and she is agreeable.  Sublingual nitroglycerin prescription was sent, its protocol and 911 protocol explained and the patient vocalized understanding questions were answered to the patient's satisfaction Mixed dyslipidemia: On lipid-lowering medications and care.  Lipids were reviewed and discussed with her at length. Cigarette smoker: I spent 5 minutes with the patient discussing solely about smoking. Smoking cessation was counseled. I suggested to the patient also different medications and pharmacological interventions. Patient is keen to try stopping on its own at this time. He will get back to me if he needs any further assistance in this matter. If the aforementioned tests were fine I told her to begin a graded exercise program and she is agreeable. Obesity: Weight reduction stressed and diet was emphasized.  She has lost significant weight but then got less compliant with healthy habits.  She is wanting to make a change for the better.  We had a long discussion about this. Patient will be seen in follow-up appointment in 6 months or earlier if the patient has any concerns.    Medication Adjustments/Labs and Tests Ordered: Current medicines are reviewed at length with the patient today.  Concerns regarding medicines are outlined above.  Orders Placed This Encounter   Procedures   Cardiac Stress Test: Informed Consent Details: Physician/Practitioner Attestation; Transcribe to consent form and obtain patient signature   MYOCARDIAL PERFUSION IMAGING   Meds ordered this encounter  Medications   atorvastatin (LIPITOR) 80 MG tablet    Sig: Take 1 tablet (80 mg total) by mouth daily.    Dispense:  90 tablet    Refill:  3   nitroGLYCERIN (NITROSTAT) 0.4 MG SL tablet    Sig: Place 1 tablet (0.4 mg total) under the tongue every 5 (five) minutes as needed for chest pain.    Dispense:  25 tablet    Refill:  11     No chief complaint on file.    History of Present Illness:    Deborah Stewart is a 67 y.o. female.  Patient has past medical history of coronary artery disease, mixed dyslipidemia and unfortunately continues to smoke.  She denies any orthopnea or PND.  She leads a sedentary lifestyle.  She occasionally has chest discomfort not related to exertion.  At the time of my evaluation, the patient is alert awake oriented and in no distress.  Past Medical History:  Diagnosis Date   Chronic back pain 07/08/2012   Pt has had lower back pain from a herniated disc for years, she is s/p cervical diskectomy.    Chronic left shoulder pain 07/15/2015   Chronic pain of left knee 07/15/2015   Coronary artery disease    10/18 PCI/DESx1 to mRCA.    DDD (degenerative disc disease), cervical    s/p diskectomy    Essential hypertension 02/01/2017  Health care maintenance 07/08/2012   Pt is being followed by a gastroenterologist for her ulcerative colitis they manage her colonoscopies.  Pt has an appointment with her OB/Gyn in July.    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   Hyperlipidemia    Hypertension    Hypothyroidism 11/10/2008   Qualifier: Diagnosis of  By: Jarold Motto MD Mervyn Skeeters R  Chronic problem s/p partial thyroidectomy     Intermittent claudication (HCC) 07/10/2019   Lesion of vulva 09/25/2017   Numbness of fingers 05/21/2020   Obesity (BMI  30.0-34.9) 07/13/2020   Osteoporosis 08/28/2016   Personal history of colonic polyps 06/2010   hyperplastic, tubular adenoma   Pre-diabetes 11/04/2018   Right foot pain 11/18/2018   Snoring disorder 01/14/2013   Solitary pulmonary nodule 04/10/2022   Thyroid nodule    Tobacco abuse 09/20/2010   Pt is a current 1 ppd smoker, has tried to quit in the past with some success.  She has tried nicotine gum, nicotine patches (caused a rash), and Chantix (had the greatest success with Chantix).     02/03/13: Patient reports that she is down to 5 cigarettes weekly now, and has been improving with Chantix. She is strongly motivated to quit.    Ulcerative colitis (HCC)    Vitamin D deficiency 08/30/2016    Past Surgical History:  Procedure Laterality Date   ANTERIOR CERVICAL DECOMP/DISCECTOMY FUSION  2004   Dr. Dutch Quint   BACK SURGERY     BREAST BIOPSY Bilateral    COLONOSCOPY     CORONARY ANGIOPLASTY WITH STENT PLACEMENT  02/06/2017   CORONARY STENT INTERVENTION N/A 02/06/2017   Procedure: CORONARY STENT INTERVENTION;  Surgeon: Lyn Records, MD;  Location: [facility];  Service: Cardiovascular;  Laterality: N/A;   FOOT SURGERY Right 09/2011    Dr. Ria Bush, DPO, removal of "mass"    FOOT SURGERY Left 1970s X 1   FOOT SURGERY Right 2000s "multiple"   "botched 1st time; tried to correct several times"   LEFT HEART CATH AND CORONARY ANGIOGRAPHY N/A 02/06/2017   Procedure: LEFT HEART CATH AND CORONARY ANGIOGRAPHY;  Surgeon: Lyn Records, MD;  Location: [facility];  Service: Cardiovascular;  Laterality: N/A;   THYROIDECTOMY, PARTIAL  06/2009   "removed goiter"   TOTAL ABDOMINAL HYSTERECTOMY     TOTAL ABDOMINAL HYSTERECTOMY W/ BILATERAL SALPINGOOPHORECTOMY     Current Medications: Current Meds  Medication Sig   alendronate (FOSAMAX) 70 MG tablet Take 70 mg by mouth once a week.   clopidogrel (PLAVIX) 75 MG tablet Take 1 tablet (75 mg total) by mouth daily with breakfast.    fluticasone (FLONASE) 50 MCG/ACT nasal spray Place 2 sprays into both nostrils daily.   hydrocortisone 2.5 % cream APPLY TOPICALLY TWICE A DAY   Hyoscyamine Sulfate SL 0.125 MG SUBL Place 1-2 tablets under the tongue every 4 (four) hours as needed for cramping (stomach cramps).   levothyroxine (SYNTHROID) 100 MCG tablet TAKE 1 TABLET BY MOUTH EVERY DAY BEFORE BREAKFAST   varenicline (CHANTIX CONTINUING MONTH PAK) 1 MG tablet Take 1 tablet (1 mg total) by mouth 2 (two) times daily.   Vitamin D, Ergocalciferol, (DRISDOL) 1.25 MG (50000 UNIT) CAPS capsule Take 1 capsule (50,000 Units total) by mouth every 7 (seven) days.   [DISCONTINUED] atorvastatin (LIPITOR) 80 MG tablet Take 1 tablet (80 mg total) by mouth daily.   [DISCONTINUED] nitroGLYCERIN (NITROSTAT) 0.4 MG SL tablet Place 1 tablet (0.4 mg total) under the tongue every  5 (five) minutes as needed for chest pain.     Allergies:   Codeine   Social History   Socioeconomic History   Marital status: Divorced    Spouse name: Not on file   Number of children: 1   Years of education: 12   Highest education level: Not on file  Occupational History   Occupation: filed Futures trader    Employer: [employer]  Tobacco Use   Smoking status: Former    Packs/day: 0.50    Years: 45.00    Additional pack years: 0.00    Total pack years: 22.50    Types: Cigarettes    Start date: 12/31/2018    Quit date: 05/29/2019    Years since quitting: 3.2   Smokeless tobacco: Never   Tobacco comments:    taking Chantix  Vaping Use   Vaping Use: Never used  Substance and Sexual Activity   Alcohol use: Yes    Alcohol/week: 5.0 standard drinks of alcohol    Types: 5 Standard drinks or equivalent per week    Comment: 02/06/2017 "might have 1-4 mixed drinks/month"   Drug use: No   Sexual activity: Not on file  Other Topics Concern   Not on file  Social History Narrative   [employer], [employer] -BS social work. Married 1980 - 16 yrs/divorced. Work - Paediatric nurse - Patent examiner). Lives alone. Dating - long term relationship/[place]. Unprotected. No history of physical or sexual abuse.    Social Determinants of Health   Financial Resource Strain: Not on file  Food Insecurity: Not on file  Transportation Needs: Not on file  Physical Activity: Not on file  Stress: Not on file  Social Connections: Not on file     Family History: The patient's family history includes Breast cancer in her sister and another family member; Diabetes in her brother, father, mother, and another family member; Heart disease in her brother, father, and another family member; Hyperlipidemia in her brother; Hypertension in her brother and brother; Lung cancer in an other family member. There is no history of Colon cancer, Stomach cancer, Rectal cancer, or Esophageal cancer.  ROS:   Please see the history of present illness.    All other systems reviewed and are negative.  EKGs/Labs/Other Studies Reviewed:    The following studies were reviewed today: I discussed my findings with the patient at length   Recent Labs: 04/10/2022: ALT 15; BUN 10; Creatinine, Ser 0.68; Hemoglobin 16.2; Platelets 326.0; Potassium 4.6; Sodium 141; TSH 0.70  Recent Lipid Panel    Component Value Date/Time   CHOL 140 04/10/2022 0948   CHOL 148 01/14/2020 1006   TRIG 117.0 04/10/2022 0948   HDL 40.10 04/10/2022 0948   HDL 40 01/14/2020 1006   CHOLHDL 3 04/10/2022 0948   VLDL 23.4 04/10/2022 0948   LDLCALC 77 04/10/2022 0948   LDLCALC 84 01/14/2020 1006   LDLDIRECT 112.1 10/11/2012 1554    Physical Exam:    VS:  BP 114/66   Pulse 70   Ht 5\' 5"  (1.651 m)   Wt 177 lb 0.6 oz (80.3 kg)   SpO2 97%   BMI 29.46 kg/m     Wt Readings from Last 3 Encounters:  09/05/22 177 lb 0.6 oz (80.3 kg)  04/10/22 166 lb (75.3 kg)  11/24/21 180 lb (81.6 kg)     GEN: Patient is in no acute distress HEENT: Normal NECK: No JVD; No carotid bruits LYMPHATICS: No  lymphadenopathy CARDIAC: Hear  sounds regular, 2/6 systolic murmur at the apex. RESPIRATORY:  Clear to auscultation without rales, wheezing or rhonchi  ABDOMEN: Soft, non-tender, non-distended MUSCULOSKELETAL:  No edema; No deformity  SKIN: Warm and dry NEUROLOGIC:  Alert and oriented x 3 PSYCHIATRIC:  Normal affect   Signed, Garwin Brothers, MD  09/05/2022 11:40 AM    [facility]

## 2022-09-15 ENCOUNTER — Telehealth (HOSPITAL_COMMUNITY): Payer: Self-pay | Admitting: *Deleted

## 2022-09-15 ENCOUNTER — Encounter (HOSPITAL_COMMUNITY): Payer: Self-pay | Admitting: Cardiology

## 2022-09-15 NOTE — Telephone Encounter (Signed)
Per DPR left detailed instructions for MPI study on 09/19/22.

## 2022-09-19 ENCOUNTER — Ambulatory Visit (HOSPITAL_COMMUNITY): Payer: Medicare HMO | Attending: Cardiovascular Disease

## 2022-09-19 DIAGNOSIS — I2510 Atherosclerotic heart disease of native coronary artery without angina pectoris: Secondary | ICD-10-CM | POA: Diagnosis not present

## 2022-09-19 LAB — MYOCARDIAL PERFUSION IMAGING
Angina Index: 0
Duke Treadmill Score: 5
Estimated workload: 7
Exercise duration (min): 5 min
Exercise duration (sec): 1 s
LV dias vol: 43 mL (ref 46–106)
LV sys vol: 12 mL
MPHR: 154 {beats}/min
Nuc Stress EF: 72 %
Peak HR: 148 {beats}/min
Percent HR: 96 %
Rest HR: 61 {beats}/min
Rest Nuclear Isotope Dose: 10.9 mCi
SDS: 2
SRS: 2
SSS: 4
ST Depression (mm): 0.5 mm
Stress Nuclear Isotope Dose: 30.5 mCi
TID: 0.84

## 2022-09-19 MED ORDER — TECHNETIUM TC 99M TETROFOSMIN IV KIT
10.9000 | PACK | Freq: Once | INTRAVENOUS | Status: AC | PRN
  Administered 2022-09-19: 10.9 via INTRAVENOUS

## 2022-09-19 MED ORDER — TECHNETIUM TC 99M TETROFOSMIN IV KIT
30.5000 | PACK | Freq: Once | INTRAVENOUS | Status: AC | PRN
  Administered 2022-09-19: 30.5 via INTRAVENOUS

## 2022-09-21 DIAGNOSIS — M79671 Pain in right foot: Secondary | ICD-10-CM | POA: Diagnosis not present

## 2022-09-21 DIAGNOSIS — L602 Onychogryphosis: Secondary | ICD-10-CM | POA: Diagnosis not present

## 2022-09-21 DIAGNOSIS — L89892 Pressure ulcer of other site, stage 2: Secondary | ICD-10-CM | POA: Diagnosis not present

## 2022-09-21 DIAGNOSIS — M79672 Pain in left foot: Secondary | ICD-10-CM | POA: Diagnosis not present

## 2022-09-21 DIAGNOSIS — L84 Corns and callosities: Secondary | ICD-10-CM | POA: Diagnosis not present

## 2022-10-02 ENCOUNTER — Encounter: Payer: Self-pay | Admitting: Gastroenterology

## 2022-11-01 DIAGNOSIS — L602 Onychogryphosis: Secondary | ICD-10-CM | POA: Diagnosis not present

## 2022-11-01 DIAGNOSIS — L84 Corns and callosities: Secondary | ICD-10-CM | POA: Diagnosis not present

## 2022-11-01 DIAGNOSIS — M79671 Pain in right foot: Secondary | ICD-10-CM | POA: Diagnosis not present

## 2022-11-01 DIAGNOSIS — L89892 Pressure ulcer of other site, stage 2: Secondary | ICD-10-CM | POA: Diagnosis not present

## 2022-11-01 DIAGNOSIS — M79672 Pain in left foot: Secondary | ICD-10-CM | POA: Diagnosis not present

## 2022-11-06 DIAGNOSIS — Z1231 Encounter for screening mammogram for malignant neoplasm of breast: Secondary | ICD-10-CM | POA: Diagnosis not present

## 2022-11-22 DIAGNOSIS — M79672 Pain in left foot: Secondary | ICD-10-CM | POA: Diagnosis not present

## 2022-11-22 DIAGNOSIS — L89892 Pressure ulcer of other site, stage 2: Secondary | ICD-10-CM | POA: Diagnosis not present

## 2022-11-22 DIAGNOSIS — L84 Corns and callosities: Secondary | ICD-10-CM | POA: Diagnosis not present

## 2022-11-22 DIAGNOSIS — M79671 Pain in right foot: Secondary | ICD-10-CM | POA: Diagnosis not present

## 2022-11-22 DIAGNOSIS — L602 Onychogryphosis: Secondary | ICD-10-CM | POA: Diagnosis not present

## 2022-11-25 ENCOUNTER — Other Ambulatory Visit: Payer: Self-pay | Admitting: Internal Medicine

## 2022-12-05 ENCOUNTER — Telehealth: Payer: Self-pay | Admitting: Cardiology

## 2022-12-05 NOTE — Telephone Encounter (Signed)
Hello Patient, brought in paperwork that need to be filled out. Verification of chronic condition paperwork will be left under desk in front office in [facility] Desk #2

## 2022-12-06 NOTE — Telephone Encounter (Signed)
Forms have been placed in Dr. Kem Parkinson folder to complete.

## 2022-12-13 DIAGNOSIS — L89892 Pressure ulcer of other site, stage 2: Secondary | ICD-10-CM | POA: Diagnosis not present

## 2022-12-13 DIAGNOSIS — M79672 Pain in left foot: Secondary | ICD-10-CM | POA: Diagnosis not present

## 2022-12-13 DIAGNOSIS — M79671 Pain in right foot: Secondary | ICD-10-CM | POA: Diagnosis not present

## 2022-12-13 DIAGNOSIS — L84 Corns and callosities: Secondary | ICD-10-CM | POA: Diagnosis not present

## 2022-12-13 DIAGNOSIS — L602 Onychogryphosis: Secondary | ICD-10-CM | POA: Diagnosis not present

## 2022-12-15 ENCOUNTER — Other Ambulatory Visit: Payer: Self-pay | Admitting: Cardiology

## 2022-12-15 DIAGNOSIS — I2510 Atherosclerotic heart disease of native coronary artery without angina pectoris: Secondary | ICD-10-CM

## 2022-12-26 ENCOUNTER — Telehealth: Payer: Self-pay | Admitting: Internal Medicine

## 2022-12-26 NOTE — Telephone Encounter (Signed)
Patient called and needs her Chantix called in to [facility] on [address].  Patient would like to have 30 days instead of 90 days.  Patient states that they are too expensive.

## 2022-12-27 NOTE — Telephone Encounter (Signed)
Is she currently taking this? That will make a difference in dosing

## 2022-12-27 NOTE — Telephone Encounter (Signed)
Yes patient is currently taking

## 2022-12-28 MED ORDER — VARENICLINE TARTRATE 1 MG PO TABS: 1.0000 mg | ORAL_TABLET | Freq: Two times a day (BID) | ORAL | 6 refills | Status: DC

## 2022-12-28 NOTE — Telephone Encounter (Signed)
Sent in

## 2023-01-03 ENCOUNTER — Ambulatory Visit
Admission: EM | Admit: 2023-01-03 | Discharge: 2023-01-03 | Disposition: A | Discharge status: Home / Self Care | Payer: Medicare HMO | Attending: Physician Assistant | Admitting: Physician Assistant

## 2023-01-03 DIAGNOSIS — U071 COVID-19: Secondary | ICD-10-CM

## 2023-01-03 DIAGNOSIS — J0100 Acute maxillary sinusitis, unspecified: Secondary | ICD-10-CM

## 2023-01-03 MED ORDER — DOXYCYCLINE HYCLATE 100 MG PO CAPS: 100.0000 mg | ORAL_CAPSULE | Freq: Two times a day (BID) | ORAL | 0 refills | Status: DC

## 2023-01-03 NOTE — ED Triage Notes (Signed)
"  I am + for COVID19, Tested first Last Thursday and + and Tested again today & +". Symptoms originally began "last Tuesday". "I thought I had a sinus infection". "I travel often back and forth to [place]". Continually, headache, cough, congestion, sinus pain/pressure". "I want any medication that can be provided".

## 2023-01-03 NOTE — ED Provider Notes (Signed)
[name]    CSN: [id number] Arrival date & time: 01/03/23  0859      History   Chief Complaint Chief Complaint  Patient presents with   COVID19    + @ Home Test    HPI Deborah Stewart is a 67 y.o. female.   Patient here today for evaluation of continued sinus pressure and congestion since being diagnosed with COVID about a week ago.  She reports that cough has improved.  She denies any vomiting or diarrhea.  Over-the-counter medication does not seem to be resolving symptoms.  The history is provided by the patient.    Past Medical History:  Diagnosis Date   Chronic back pain 07/08/2012   Pt has had lower back pain from a herniated disc for years, she is s/p cervical diskectomy.    Chronic left shoulder pain 07/15/2015   Chronic pain of left knee 07/15/2015   Coronary artery disease    10/18 PCI/DESx1 to mRCA.    DDD (degenerative disc disease), cervical    s/p diskectomy    Essential hypertension 02/01/2017   Health care maintenance 07/08/2012   Pt is being followed by a gastroenterologist for her ulcerative colitis they manage her colonoscopies.  Pt has an appointment with her OB/Gyn in July.    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   Hyperlipidemia    Hypertension    Hypothyroidism 11/10/2008   Qualifier: Diagnosis of  By: Jarold Motto MD Mervyn Skeeters R  Chronic problem s/p partial thyroidectomy     Intermittent claudication (HCC) 07/10/2019   Lesion of vulva 09/25/2017   Numbness of fingers 05/21/2020   Obesity (BMI 30.0-34.9) 07/13/2020   Osteoporosis 08/28/2016   Personal history of colonic polyps 06/2010   hyperplastic, tubular adenoma   Pre-diabetes 11/04/2018   Right foot pain 11/18/2018   Snoring disorder 01/14/2013   Solitary pulmonary nodule 04/10/2022   Thyroid nodule    Tobacco abuse 09/20/2010   Pt is a current 1 ppd smoker, has tried to quit in the past with some success.  She has tried nicotine gum, nicotine patches (caused a  rash), and Chantix (had the greatest success with Chantix).     02/03/13: Patient reports that she is down to 5 cigarettes weekly now, and has been improving with Chantix. She is strongly motivated to quit.    Ulcerative colitis (HCC)    Vitamin D deficiency 08/30/2016    Patient Active Problem List   Diagnosis Date Noted   Solitary pulmonary nodule 04/10/2022   Obesity (BMI 30.0-34.9) 07/13/2020   Numbness of fingers 05/21/2020   Coronary artery disease    DDD (degenerative disc disease), cervical    Hyperlipidemia    Hypertension    Thyroid nodule    Ulcerative colitis (HCC)    Intermittent claudication (HCC) 07/10/2019   Right foot pain 11/18/2018   Pre-diabetes 11/04/2018   Lesion of vulva 09/25/2017   Essential hypertension 02/01/2017   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   Vitamin D deficiency 08/30/2016   Osteoporosis 08/28/2016   Chronic left shoulder pain 07/15/2015   Chronic pain of left knee 07/15/2015   Snoring disorder 01/14/2013   Health care maintenance 07/08/2012   Chronic back pain 07/08/2012   Tobacco abuse 09/20/2010   Personal history of colonic polyps 06/2010   Hypothyroidism 11/10/2008    Past Surgical History:  Procedure Laterality Date   ANTERIOR CERVICAL DECOMP/DISCECTOMY FUSION  2004   Dr. Benay Spice SURGERY  BREAST BIOPSY Bilateral    COLONOSCOPY     CORONARY ANGIOPLASTY WITH STENT PLACEMENT  02/06/2017   CORONARY STENT INTERVENTION N/A 02/06/2017   Procedure: CORONARY STENT INTERVENTION;  Surgeon: Lyn Records, MD;  Location: [facility];  Service: Cardiovascular;  Laterality: N/A;   FOOT SURGERY Right 09/2011    Dr. Ria Bush, DPO, removal of "mass"    FOOT SURGERY Left 1970s X 1   FOOT SURGERY Right 2000s "multiple"   "botched 1st time; tried to correct several times"   LEFT HEART CATH AND CORONARY ANGIOGRAPHY N/A 02/06/2017   Procedure: LEFT HEART CATH AND CORONARY ANGIOGRAPHY;  Surgeon: Lyn Records, MD;   Location: [facility];  Service: Cardiovascular;  Laterality: N/A;   THYROIDECTOMY, PARTIAL  06/2009   "removed goiter"   TOTAL ABDOMINAL HYSTERECTOMY     TOTAL ABDOMINAL HYSTERECTOMY W/ BILATERAL SALPINGOOPHORECTOMY     OB History     Gravida  1   Para  1   Term      Preterm      AB      Living         SAB      IAB      Ectopic      Multiple      Live Births               Home Medications    Prior to Admission medications   Medication Sig Start Date End Date Taking? Authorizing Provider  alendronate (FOSAMAX) 70 MG tablet Take 70 mg by mouth once a week. 12/09/20  Yes [provider]  atorvastatin (LIPITOR) 80 MG tablet Take 1 tablet (80 mg total) by mouth daily. 09/05/22  Yes Revankar, Aundra Dubin, MD  clopidogrel (PLAVIX) 75 MG tablet TAKE 1 TABLET BY MOUTH DAILY WITH BREAKFAST. 12/15/22  Yes Revankar, Aundra Dubin, MD  doxycycline (VIBRAMYCIN) 100 MG capsule Take 1 capsule (100 mg total) by mouth 2 (two) times daily. 01/03/23  Yes Tomi Bamberger, PA-C  levothyroxine (SYNTHROID) 100 MCG tablet TAKE 1 TABLET BY MOUTH EVERY DAY BEFORE BREAKFAST 05/09/22  Yes Myrlene Broker, MD  varenicline (CHANTIX CONTINUING MONTH PAK) 1 MG tablet Take 1 tablet (1 mg total) by mouth 2 (two) times daily. 12/28/22  Yes Myrlene Broker, MD  fluticasone Aleda Grana) 50 MCG/ACT nasal spray Place 2 sprays into both nostrils daily. 08/28/21   Wallis Bamberg, PA-C  hydrocortisone 2.5 % cream APPLY TOPICALLY TWICE A DAY 05/09/22   Myrlene Broker, MD  Hyoscyamine Sulfate SL 0.125 MG SUBL Place 1-2 tablets under the tongue every 4 (four) hours as needed for cramping (stomach cramps).    [provider]  nitroGLYCERIN (NITROSTAT) 0.4 MG SL tablet Place 1 tablet (0.4 mg total) under the tongue every 5 (five) minutes as needed for chest pain. 09/05/22   Revankar, Aundra Dubin, MD  Vitamin D, Ergocalciferol, (DRISDOL) 1.25 MG (50000 UNIT) CAPS capsule Take 1 capsule (50,000 Units  total) by mouth every 7 (seven) days. 04/10/22   Myrlene Broker, MD    Family History Family History  Problem Relation Age of Onset   Breast cancer Sister    Diabetes Mother    Diabetes Father    Heart disease Father        CHF   Diabetes Brother    Heart disease Brother    Hypertension Brother    Hyperlipidemia Brother    Hypertension Brother    Lung cancer  Other        uncle   Breast cancer Other        great aunt   Diabetes Other        grandparents   Heart disease Other        grandfather   Colon cancer Neg Hx    Stomach cancer Neg Hx    Rectal cancer Neg Hx    Esophageal cancer Neg Hx     Social History Social History   Tobacco Use   Smoking status: Former    Current packs/day: 0.00    Average packs/day: 0.5 packs/day for 45.0 years (22.5 ttl pk-yrs)    Types: Cigarettes    Start date: 12/31/2018    Quit date: 05/29/2019    Years since quitting: 3.6   Smokeless tobacco: Never   Tobacco comments:    taking Chantix  Vaping Use   Vaping status: Never Used  Substance Use Topics   Alcohol use: Yes    Alcohol/week: 5.0 standard drinks of alcohol    Types: 5 Standard drinks or equivalent per week    Comment: 02/06/2017 "might have 1-4 mixed drinks/month"   Drug use: No     Allergies   Codeine   Review of Systems Review of Systems  Constitutional:  Negative for chills and fever.  HENT:  Positive for congestion and sinus pressure. Negative for ear pain and sore throat.   Eyes:  Negative for discharge and redness.  Respiratory:  Negative for cough, shortness of breath and wheezing.   Gastrointestinal:  Negative for abdominal pain, diarrhea, nausea and vomiting.     Physical Exam Triage Vital Signs ED Triage Vitals  Encounter Vitals Group     BP      Systolic BP Percentile      Diastolic BP Percentile      Pulse      Resp      Temp      Temp src      SpO2      Weight      Height      Head Circumference      Peak Flow      Pain Score       Pain Loc      Pain Education      Exclude from Growth Chart    No data found.  Updated Vital Signs BP 130/76 (BP Location: Left Arm)   Pulse (!) 59   Temp 98.4 F (36.9 C) (Oral)   Resp 18   Ht 5\' 5"  (1.651 m)   Wt 177 lb 0.5 oz (80.3 kg)   SpO2 96%   BMI 29.46 kg/m      Physical Exam Vitals and nursing note reviewed.  Constitutional:      General: She is not in acute distress.    Appearance: Normal appearance. She is not ill-appearing.  HENT:     Head: Normocephalic and atraumatic.     Right Ear: Tympanic membrane normal.     Left Ear: Tympanic membrane normal.     Nose: Congestion present.     Mouth/Throat:     Mouth: Mucous membranes are moist.     Pharynx: No oropharyngeal exudate or posterior oropharyngeal erythema.  Eyes:     Conjunctiva/sclera: Conjunctivae normal.  Cardiovascular:     Rate and Rhythm: Normal rate and regular rhythm.     Heart sounds: Normal heart sounds. No murmur heard. Pulmonary:     Effort: Pulmonary effort is normal. No  respiratory distress.     Breath sounds: Normal breath sounds. No wheezing, rhonchi or rales.  Skin:    General: Skin is warm and dry.  Neurological:     Mental Status: She is alert.  Psychiatric:        Mood and Affect: Mood normal.        Thought Content: Thought content normal.      UC Treatments / Results  Labs (all labs ordered are listed, but only abnormal results are displayed) Labs Reviewed - No data to display  EKG   Radiology No results found.  Procedures Procedures (including critical care time)  Medications Ordered in UC Medications - No data to display  Initial Impression / Assessment and Plan / UC Course  I have reviewed the triage vital signs and the nursing notes.  Pertinent labs & imaging results that were available during my care of the patient were reviewed by me and considered in my medical decision making (see chart for details).    Will treat to cover suspected sinusitis  with doxycycline.  Encouraged follow-up if no gradual improvement with any further concerns.  Final Clinical Impressions(s) / UC Diagnoses   Final diagnoses:  Acute non-recurrent maxillary sinusitis  COVID-19   Discharge Instructions   None    ED Prescriptions     Medication Sig Dispense Auth. Provider   doxycycline (VIBRAMYCIN) 100 MG capsule Take 1 capsule (100 mg total) by mouth 2 (two) times daily. 20 capsule Tomi Bamberger, PA-C      PDMP not reviewed this encounter.   Tomi Bamberger, PA-C 01/03/23 1156

## 2023-01-11 DIAGNOSIS — L84 Corns and callosities: Secondary | ICD-10-CM | POA: Diagnosis not present

## 2023-01-11 DIAGNOSIS — L602 Onychogryphosis: Secondary | ICD-10-CM | POA: Diagnosis not present

## 2023-01-11 DIAGNOSIS — M79671 Pain in right foot: Secondary | ICD-10-CM | POA: Diagnosis not present

## 2023-01-11 DIAGNOSIS — L89892 Pressure ulcer of other site, stage 2: Secondary | ICD-10-CM | POA: Diagnosis not present

## 2023-01-11 DIAGNOSIS — M79672 Pain in left foot: Secondary | ICD-10-CM | POA: Diagnosis not present

## 2023-01-12 DIAGNOSIS — H5203 Hypermetropia, bilateral: Secondary | ICD-10-CM | POA: Diagnosis not present

## 2023-01-15 DIAGNOSIS — H524 Presbyopia: Secondary | ICD-10-CM | POA: Diagnosis not present

## 2023-01-31 ENCOUNTER — Encounter: Payer: Self-pay | Admitting: Gastroenterology

## 2023-02-01 DIAGNOSIS — M79672 Pain in left foot: Secondary | ICD-10-CM | POA: Diagnosis not present

## 2023-02-01 DIAGNOSIS — L89892 Pressure ulcer of other site, stage 2: Secondary | ICD-10-CM | POA: Diagnosis not present

## 2023-02-01 DIAGNOSIS — L84 Corns and callosities: Secondary | ICD-10-CM | POA: Diagnosis not present

## 2023-02-01 DIAGNOSIS — L602 Onychogryphosis: Secondary | ICD-10-CM | POA: Diagnosis not present

## 2023-02-01 DIAGNOSIS — M79671 Pain in right foot: Secondary | ICD-10-CM | POA: Diagnosis not present

## 2023-02-06 ENCOUNTER — Ambulatory Visit: Payer: Medicare HMO | Admitting: Gastroenterology

## 2023-02-06 ENCOUNTER — Encounter: Payer: Self-pay | Admitting: Gastroenterology

## 2023-02-06 ENCOUNTER — Telehealth: Payer: Self-pay

## 2023-02-06 VITALS — BP 124/84 | HR 57 | Ht 65.0 in | Wt 177.0 lb

## 2023-02-06 DIAGNOSIS — Z860101 Personal history of adenomatous and serrated colon polyps: Secondary | ICD-10-CM | POA: Diagnosis not present

## 2023-02-06 DIAGNOSIS — Z7902 Long term (current) use of antithrombotics/antiplatelets: Secondary | ICD-10-CM | POA: Diagnosis not present

## 2023-02-06 MED ORDER — NA SULFATE-K SULFATE-MG SULF 17.5-3.13-1.6 GM/177ML PO SOLN: 1.0000 | Freq: Once | ORAL | 0 refills | Status: AC

## 2023-02-06 NOTE — Progress Notes (Signed)
Assessment    Personal history of adenomatous colon polyps, due for surveillance Severe left colon diverticulosis with history of SCAD History of IBS, currently asymptomatic  RCA stent on Plavix   Recommendations   Schedule colonoscopy. The risks (including bleeding, perforation, infection, missed lesions, medication reactions and possible hospitalization or surgery if complications occur), benefits, and alternatives to colonoscopy with possible biopsy and possible polypectomy were discussed with the patient and they consent to proceed.   Hold Plavix 5 days before procedure - will instruct when and how to resume after procedure. Low but real risk of cardiovascular event such as heart attack, stroke, embolism, thrombosis or ischemia/infarct of other organs off Plavix explained and need to seek urgent help if this occurs. The patient consents to proceed. Will communicate by phone or EMR with patient's prescribing provider to confirm that holding Plavix is reasonable in this case.     HPI   Chief complaint: Personal history of colon polyps on Plavix  Patient profile:  Deborah Stewart is a 67 y.o. female referred by Myrlene Broker, * MD for personal history of adenomatous colon polyps.  3 adenomatous colon polyps found on colonoscopy in August 2021 as below.  She is maintained on Plavix following a coronary artery stent placement.  She has no ongoing gastrointestinal complaints.  Denies weight loss, abdominal pain, constipation, diarrhea, change in stool caliber, melena, hematochezia, nausea, vomiting, dysphagia, reflux symptoms, chest pain.   Previous Labs / Imaging::    Latest Ref Rng & Units 04/10/2022    9:48 AM 01/14/2020   10:06 AM 06/12/2019   11:14 AM  CBC  WBC 4.0 - 10.5 K/uL 9.7  9.0  10.0   Hemoglobin 12.0 - 15.0 g/dL 54.0  98.1  19.1   Hematocrit 36.0 - 46.0 % 48.2  45.2  44.5   Platelets 150.0 - 400.0 K/uL 326.0  321  332     No results found for: "LIPASE"     Latest Ref Rng & Units 04/10/2022    9:48 AM 10/20/2021    9:42 AM 05/20/2020    9:09 AM  CMP  Glucose 70 - 99 mg/dL 84  90  95   BUN 6 - 23 mg/dL 10  6  8    Creatinine 0.40 - 1.20 mg/dL 4.78  2.95  6.21   Sodium 135 - 145 mEq/L 141  143  139   Potassium 3.5 - 5.1 mEq/L 4.6  4.7  4.2   Chloride 96 - 112 mEq/L 103  103  103   CO2 19 - 32 mEq/L 29  25  29    Calcium 8.4 - 10.5 mg/dL 9.5  9.4  9.5   Total Protein 6.0 - 8.3 g/dL 7.4  7.2  7.3   Total Bilirubin 0.2 - 1.2 mg/dL 0.7  0.3  0.5   Alkaline Phos 39 - 117 U/L 74  84  76   AST 0 - 37 U/L 19  15  16    ALT 0 - 35 U/L 15  11  12       Previous GI evaluation    Endoscopies:  Colonoscopy Aug 2021 - The quality of the bowel preparation was adequate after extensive lavage and suction.  - The colonoscopy was somewhat difficult due to restricted mobility of the left colon and a tortuous colon. Changed to a peds colonoscope to traverse the sigmoid colon. - Ten 6 to 9 mm polyps in the sigmoid colon, in the transverse colon and in the  ascending colon, removed with a cold snare. Resected and retrieved.  - Severe diverticulosis in the left colon. There was narrowing of the colon in association with the diverticular opening. There was evidence of diverticular spasm. Peri-diverticular erythema was seen.  - The examination was otherwise normal on direct and retroflexion views.    Imaging:  MYOCARDIAL PERFUSION IMAGING   LV perfusion is normal. There is no evidence of ischemia. There is no  evidence of infarction.   Left ventricular function is normal. Nuclear stress EF: 72 %. The left  ventricular ejection fraction is hyperdynamic (>65%). End diastolic cavity  size is normal.   The study is normal. The study is low risk.   Average exercise capacity (5:01 min:s; 7.0 METS). Hypertensive response  to exercise. Normal HR response.    Past Medical History:  Diagnosis Date   Chronic back pain 07/08/2012   Pt has had lower back pain from a  herniated disc for years, she is s/p cervical diskectomy.    Chronic left shoulder pain 07/15/2015   Chronic pain of left knee 07/15/2015   Coronary artery disease    10/18 PCI/DESx1 to mRCA.    DDD (degenerative disc disease), cervical    s/p diskectomy    Essential hypertension 02/01/2017   Health care maintenance 07/08/2012   Pt is being followed by a gastroenterologist for her ulcerative colitis they manage her colonoscopies.  Pt has an appointment with her OB/Gyn in July.    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   Hyperlipidemia    Hypertension    Hypothyroidism 11/10/2008   Qualifier: Diagnosis of  By: Jarold Motto MD Mervyn Skeeters R  Chronic problem s/p partial thyroidectomy     Intermittent claudication (HCC) 07/10/2019   Lesion of vulva 09/25/2017   Numbness of fingers 05/21/2020   Obesity (BMI 30.0-34.9) 07/13/2020   Osteoporosis 08/28/2016   Personal history of colonic polyps 06/2010   hyperplastic, tubular adenoma   Pre-diabetes 11/04/2018   Right foot pain 11/18/2018   Snoring disorder 01/14/2013   Solitary pulmonary nodule 04/10/2022   Thyroid nodule    Tobacco abuse 09/20/2010   Pt is a current 1 ppd smoker, has tried to quit in the past with some success.  She has tried nicotine gum, nicotine patches (caused a rash), and Chantix (had the greatest success with Chantix).     02/03/13: Patient reports that she is down to 5 cigarettes weekly now, and has been improving with Chantix. She is strongly motivated to quit.    Ulcerative colitis (HCC)    Vitamin D deficiency 08/30/2016   Past Surgical History:  Procedure Laterality Date   ANTERIOR CERVICAL DECOMP/DISCECTOMY FUSION  2004   Dr. Dutch Quint   BACK SURGERY     BREAST BIOPSY Bilateral    COLONOSCOPY     CORONARY ANGIOPLASTY WITH STENT PLACEMENT  02/06/2017   CORONARY STENT INTERVENTION N/A 02/06/2017   Procedure: CORONARY STENT INTERVENTION;  Surgeon: Lyn Records, MD;  Location: [facility];  Service:  Cardiovascular;  Laterality: N/A;   FOOT SURGERY Right 09/2011    Dr. Ria Bush, DPO, removal of "mass"    FOOT SURGERY Left 1970s X 1   FOOT SURGERY Right 2000s "multiple"   "botched 1st time; tried to correct several times"   LEFT HEART CATH AND CORONARY ANGIOGRAPHY N/A 02/06/2017   Procedure: LEFT HEART CATH AND CORONARY ANGIOGRAPHY;  Surgeon: Lyn Records, MD;  Location: [facility];  Service: Cardiovascular;  Laterality: N/A;  THYROIDECTOMY, PARTIAL  06/2009   "removed goiter"   TOTAL ABDOMINAL HYSTERECTOMY     TOTAL ABDOMINAL HYSTERECTOMY W/ BILATERAL SALPINGOOPHORECTOMY    Family History  Problem Relation Age of Onset   Diabetes Mother    Diabetes Father    Heart disease Father        CHF   Breast cancer Sister    Diabetes Brother    Heart disease Brother    Hypertension Brother    Hyperlipidemia Brother    Hypertension Brother    Lung cancer Other        uncle   Breast cancer Other        great aunt   Diabetes Other        grandparents   Heart disease Other        grandfather   Colon cancer Neg Hx    Stomach cancer Neg Hx    Rectal cancer Neg Hx    Esophageal cancer Neg Hx    Social History   Tobacco Use   Smoking status: Former    Current packs/day: 0.00    Average packs/day: 0.5 packs/day for 45.0 years (22.5 ttl pk-yrs)    Types: Cigarettes    Start date: 12/31/2018    Quit date: 05/29/2019    Years since quitting: 3.6   Smokeless tobacco: Never   Tobacco comments:    taking Chantix  Vaping Use   Vaping status: Never Used  Substance Use Topics   Alcohol use: Yes    Alcohol/week: 5.0 standard drinks of alcohol    Types: 5 Standard drinks or equivalent per week    Comment: occ   Drug use: Yes    Types: Marijuana    Comment: socially   Current Outpatient Medications  Medication Sig Dispense Refill   alendronate (FOSAMAX) 70 MG tablet Take 70 mg by mouth once a week.     clopidogrel (PLAVIX) 75 MG tablet TAKE 1 TABLET BY MOUTH DAILY  WITH BREAKFAST. 90 tablet 3   fluticasone (FLONASE) 50 MCG/ACT nasal spray Place 2 sprays into both nostrils daily. 16 g 12   hydrocortisone 2.5 % cream APPLY TOPICALLY TWICE A DAY 84 g 0   Hyoscyamine Sulfate SL 0.125 MG SUBL Place 1-2 tablets under the tongue every 4 (four) hours as needed for cramping (stomach cramps).     ibuprofen (ADVIL) 200 MG tablet Take 200 mg by mouth every 6 (six) hours as needed.     levothyroxine (SYNTHROID) 100 MCG tablet TAKE 1 TABLET BY MOUTH EVERY DAY BEFORE BREAKFAST 90 tablet 3   nitroGLYCERIN (NITROSTAT) 0.4 MG SL tablet Place 1 tablet (0.4 mg total) under the tongue every 5 (five) minutes as needed for chest pain. 25 tablet 11   Turmeric Curcumin (TURMERIC COMPLEX/BLACK PEPPER) 08-998 MG CAPS Take 1 capsule by mouth in the morning and at bedtime.     varenicline (CHANTIX CONTINUING MONTH PAK) 1 MG tablet Take 1 tablet (1 mg total) by mouth 2 (two) times daily. 60 tablet 6   Vitamin D, Ergocalciferol, (DRISDOL) 1.25 MG (50000 UNIT) CAPS capsule Take 1 capsule (50,000 Units total) by mouth every 7 (seven) days. (Patient not taking: Reported on 02/06/2023) 12 capsule 0   No current facility-administered medications for this visit.   Allergies  Allergen Reactions   Codeine     REACTION: nausea and hives    Review of Systems: All other systems reviewed and negative except where noted in HPI.    Physical Exam  Wt Readings from Last 3 Encounters:  02/06/23 177 lb (80.3 kg)  01/03/23 177 lb 0.5 oz (80.3 kg)  09/19/22 177 lb (80.3 kg)    Ht 5\' 5"  (1.651 m)   Wt 177 lb (80.3 kg)   BMI 29.45 kg/m  Constitutional:  Generally well appearing female in no acute distress. HEENT: Pupils normal.  Conjunctivae are normal. No scleral icterus. No oral lesions or deformities noted.  Neck: Supple.  Cardiac: Normal rate, regular rhythm without murmurs. Pulmonary/chest: Effort normal and breath sounds normal. No wheezing, rales or rhonchi. Abdominal: Soft,  nondistended, nontender. Active bowel sounds. No palpable HSM, masses or hernias. Rectal: Deferred to colonoscopy  Extremities: No edema or deformities noted Neurological: Alert and oriented to person, place and time. Psychiatric: Pleasant. Normal mood and affect. Behavior is normal. Skin: Skin is warm and dry. No rashes noted.  Claudette Head, MD   cc:  Referring Provider Myrlene Broker, * MD

## 2023-02-06 NOTE — Patient Instructions (Signed)
You have been scheduled for a colonoscopy. Please follow written instructions given to you at your visit today.   Please pick up your prep supplies at the pharmacy within the next 1-3 days.  If you use inhalers (even only as needed), please bring them with you on the day of your procedure.  DO NOT TAKE 7 DAYS PRIOR TO TEST- Trulicity (dulaglutide) Ozempic, Wegovy (semaglutide) Mounjaro (tirzepatide) Bydureon Bcise (exanatide extended release)  DO NOT TAKE 1 DAY PRIOR TO YOUR TEST Rybelsus (semaglutide) Adlyxin (lixisenatide) Victoza (liraglutide) Byetta (exanatide) ___________________________________________________________________________  The [facility] providers would like to encourage you to use [facility] to communicate with providers for non-urgent requests or questions.  Due to long hold times on the telephone, sending your provider a message by [facility] may be a faster and more efficient way to get a response.  Please allow 48 business hours for a response.  Please remember that this is for non-urgent requests.   Due to recent changes in healthcare laws, you may see the results of your imaging and laboratory studies on MyChart before your provider has had a chance to review them.  We understand that in some cases there may be results that are confusing or concerning to you. Not all laboratory results come back in the same time frame and the provider may be waiting for multiple results in order to interpret others.  Please give [place] 48 hours in order for your provider to thoroughly review all the results before contacting the office for clarification of your results.   Thank you for choosing me and [facility].  Venita Lick. Pleas Koch., MD., Clementeen Graham

## 2023-02-06 NOTE — Telephone Encounter (Signed)
Left message for patient to return my call.

## 2023-02-06 NOTE — Telephone Encounter (Signed)
[facility] Pre-operative Risk Assessment     Request for surgical clearance:     Endoscopy Procedure  What type of surgery is being performed?     colonoscopy  When is this surgery scheduled?     02/22/23  What type of clearance is required ?   Pharmacy  Are there any medications that need to be held prior to surgery and how long? Plavix x 5 days  Practice name and name of physician performing surgery?      [facility]  What is your office phone and fax number?      Phone- [phone]  Fax- [phone]  Anesthesia type (None, local, MAC, general) ?       MAC

## 2023-02-06 NOTE — Telephone Encounter (Signed)
Informed patient she can hold Plavix 5 days prior to her procedure. Patient verbalized understanding.

## 2023-02-06 NOTE — Telephone Encounter (Signed)
   Patient Name: KYREE FEDORKO  DOB: [birth year] MRN: [id number]  Primary Cardiologist: Garwin Brothers, MD  Chart reviewed as part of pre-operative protocol coverage. Given past medical history and time since last visit, based on ACC/AHA guidelines, CACI ORREN is at acceptable risk for the planned procedure without further cardiovascular testing.   Per office protocol, if patient is without any new symptoms or concerns at the time of their virtual visit,  She may hold Plavix for 5 days prior to procedure. Please resume Plavix as soon as possible postprocedure, at the discretion of the surgeon.    I will route this recommendation to the requesting party via Epic fax function and remove from pre-op pool.  Please call with questions.  Joni Reining, NP 02/06/2023, 11:40 AM

## 2023-02-14 ENCOUNTER — Encounter: Payer: Self-pay | Admitting: Gastroenterology

## 2023-02-21 DIAGNOSIS — L89892 Pressure ulcer of other site, stage 2: Secondary | ICD-10-CM | POA: Diagnosis not present

## 2023-02-21 DIAGNOSIS — M79671 Pain in right foot: Secondary | ICD-10-CM | POA: Diagnosis not present

## 2023-02-27 ENCOUNTER — Encounter: Payer: Medicare HMO | Admitting: Gastroenterology

## 2023-03-07 ENCOUNTER — Encounter: Payer: Medicare HMO | Admitting: Gastroenterology

## 2023-03-13 ENCOUNTER — Telehealth: Payer: Self-pay

## 2023-03-13 ENCOUNTER — Ambulatory Visit: Payer: Medicare HMO | Admitting: Gastroenterology

## 2023-03-13 ENCOUNTER — Encounter: Payer: Self-pay | Admitting: Gastroenterology

## 2023-03-13 VITALS — BP 185/85 | HR 78 | Temp 98.2°F | Resp 17 | Ht 65.0 in | Wt 177.0 lb

## 2023-03-13 DIAGNOSIS — D125 Benign neoplasm of sigmoid colon: Secondary | ICD-10-CM

## 2023-03-13 DIAGNOSIS — R7303 Prediabetes: Secondary | ICD-10-CM | POA: Diagnosis not present

## 2023-03-13 DIAGNOSIS — Z09 Encounter for follow-up examination after completed treatment for conditions other than malignant neoplasm: Secondary | ICD-10-CM | POA: Diagnosis not present

## 2023-03-13 DIAGNOSIS — Z860101 Personal history of adenomatous and serrated colon polyps: Secondary | ICD-10-CM | POA: Diagnosis not present

## 2023-03-13 DIAGNOSIS — D123 Benign neoplasm of transverse colon: Secondary | ICD-10-CM | POA: Diagnosis not present

## 2023-03-13 DIAGNOSIS — D120 Benign neoplasm of cecum: Secondary | ICD-10-CM

## 2023-03-13 DIAGNOSIS — Z1211 Encounter for screening for malignant neoplasm of colon: Secondary | ICD-10-CM | POA: Diagnosis not present

## 2023-03-13 DIAGNOSIS — I10 Essential (primary) hypertension: Secondary | ICD-10-CM | POA: Diagnosis not present

## 2023-03-13 DIAGNOSIS — E039 Hypothyroidism, unspecified: Secondary | ICD-10-CM | POA: Diagnosis not present

## 2023-03-13 DIAGNOSIS — K635 Polyp of colon: Secondary | ICD-10-CM | POA: Diagnosis not present

## 2023-03-13 DIAGNOSIS — E785 Hyperlipidemia, unspecified: Secondary | ICD-10-CM | POA: Diagnosis not present

## 2023-03-13 MED ORDER — SODIUM CHLORIDE 0.9 % IV SOLN: 500.0000 mL | INTRAVENOUS | Status: DC

## 2023-03-13 NOTE — Op Note (Signed)
[facility] Patient Name: Deborah Stewart Procedure Date: 03/13/2023 2:14 PM MRN: [id number] Endoscopist: Meryl Dare , MD, [phone] Age: 67 Referring MD:  Date of Birth: 05/05/1955 Gender: Female Account #: [id number] Procedure:                Colonoscopy Indications:              Surveillance: Personal history of adenomatous                            polyps on last colonoscopy 3 years ago Medicines:                Monitored Anesthesia Care Procedure:                Pre-Anesthesia Assessment:                           - Prior to the procedure, a History and Physical                            was performed, and patient medications and                            allergies were reviewed. The patient's tolerance of                            previous anesthesia was also reviewed. The risks                            and benefits of the procedure and the sedation                            options and risks were discussed with the patient.                            All questions were answered, and informed consent                            was obtained. Prior Anticoagulants: The patient has                            taken Plavix (clopidogrel), last dose was 5 days                            prior to procedure. ASA Grade Assessment: III - A                            patient with severe systemic disease. After                            reviewing the risks and benefits, the patient was                            deemed in satisfactory condition to undergo the  procedure.                           After obtaining informed consent, the colonoscope                            was passed under direct vision. Throughout the                            procedure, the patient's blood pressure, pulse, and                            oxygen saturations were monitored continuously. The                            Olympus Scope SN: [id number] was introduced  through                            the anus and advanced to the the cecum, identified                            by appendiceal orifice and ileocecal valve. The                            ileocecal valve, appendiceal orifice, and rectum                            were photographed. The quality of the bowel                            preparation was adequate after extensive lavage,                            suction. The colonoscopy was somewhat difficult due                            to restricted mobility of the left colon,                            significant looping and a tortuous colon. The                            patient tolerated the procedure well. Scope In: 2:19:14 PM Scope Out: 2:52:24 PM Scope Withdrawal Time: 0 hours 23 minutes 29 seconds  Total Procedure Duration: 0 hours 33 minutes 10 seconds  Findings:                 The perianal and digital rectal examinations were                            normal.                           Eight sessile polyps were found in the sigmoid  colon (5), transverse colon (2) and cecum (1). The                            polyps were 5 to 8 mm in size. These polyps were                            removed with a cold snare. Resection and retrieval                            were complete.                           Multiple medium-mouthed and small-mouthed                            diverticula were found in the left colon. There was                            narrowing of the colon in association with the                            diverticular opening. There was evidence of                            diverticular spasm. Peri-diverticular erythema was                            seen. There was no evidence of diverticular                            bleeding.                           The exam was otherwise without abnormality on                            direct and retroflexion views. Complications:            No  immediate complications. Estimated blood loss:                            None. Estimated Blood Loss:     Estimated blood loss: none. Impression:               - Eight 5 to 8 mm polyps in the sigmoid colon, in                            the transverse colon and in the cecum, removed with                            a cold snare. Resected and retrieved.                           - Severe diverticulosis in the left colon. There  was narrowing of the colon in association with the                            diverticular opening. There was evidence of                            diverticular spasm. Peri-diverticular erythema was                            seen. There was no evidence of diverticular                            bleeding.                           - The examination was otherwise normal on direct                            and retroflexion views. Recommendation:           - Repeat colonoscopy date to be determined after                            pending pathology results are reviewed for                            surveillance based on pathology results with an                            extended bowel prep.                           - Resume Plavix (clopidogrel) in 2 days at prior                            dose. Refer to managing physician for further                            adjustment of therapy.                           - Patient has a contact number available for                            emergencies. The signs and symptoms of potential                            delayed complications were discussed with the                            patient. Return to normal activities tomorrow.                            Written discharge instructions were provided to the  patient.                           - Resume previous diet adding high fiber.                           - Continue present medications.                           -  Await pathology results. Meryl Dare, MD 03/13/2023 3:00:00 PM This report has been signed electronically.

## 2023-03-13 NOTE — Telephone Encounter (Signed)
Pt took all of suprep last night, pt had 2 day prep ordered, she has been on clear liquids since Sunday afternoon, pt states she is feeling fine, just had some urgent bms last night, but then clear yellow bms. No bm this am yet.  Pt encouraged to drink from clear liquid list at least 1-2 cups or more per hour until 11 AM and then pt to arrive early at 1 pm for 2 pm procedure today since Dr Russella Dar had an opening in his schedule.  Pt verb understanding.

## 2023-03-13 NOTE — Progress Notes (Signed)
History & Physical  Primary Care Physician:  Myrlene Broker, MD Primary Gastroenterologist: Claudette Head, MD  Impression / Plan:  Personal history of adenomatous colon polyps for surveillance colonoscopy.  Plavix held for 5 days prior.  CHIEF COMPLAINT:  Personal history of colon polyps   HPI: Deborah Stewart is a 67 y.o. female with a personal history of adenomatous colon polyps for surveillance colonoscopy.  Plavix held for 5 days prior.   Past Medical History:  Diagnosis Date   Chronic back pain 07/08/2012   Pt has had lower back pain from a herniated disc for years, she is s/p cervical diskectomy.    Chronic left shoulder pain 07/15/2015   Chronic pain of left knee 07/15/2015   Coronary artery disease    10/18 PCI/DESx1 to mRCA.    DDD (degenerative disc disease), cervical    s/p diskectomy    Essential hypertension 02/01/2017   Health care maintenance 07/08/2012   Pt is being followed by a gastroenterologist for her ulcerative colitis they manage her colonoscopies.  Pt has an appointment with her OB/Gyn in July.    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   Hyperlipidemia    Hypertension    Hypothyroidism 11/10/2008   Qualifier: Diagnosis of  By: Jarold Motto MD Mervyn Skeeters R  Chronic problem s/p partial thyroidectomy     Intermittent claudication (HCC) 07/10/2019   Lesion of vulva 09/25/2017   Numbness of fingers 05/21/2020   Obesity (BMI 30.0-34.9) 07/13/2020   Osteoporosis 08/28/2016   Personal history of colonic polyps 06/2010   hyperplastic, tubular adenoma   Pre-diabetes 11/04/2018   Right foot pain 11/18/2018   Snoring disorder 01/14/2013   Solitary pulmonary nodule 04/10/2022   Thyroid nodule    Tobacco abuse 09/20/2010   Pt is a current 1 ppd smoker, has tried to quit in the past with some success.  She has tried nicotine gum, nicotine patches (caused a rash), and Chantix (had the greatest success with Chantix).     02/03/13: Patient reports that she  is down to 5 cigarettes weekly now, and has been improving with Chantix. She is strongly motivated to quit.    Ulcerative colitis (HCC)    Vitamin D deficiency 08/30/2016    Past Surgical History:  Procedure Laterality Date   ANTERIOR CERVICAL DECOMP/DISCECTOMY FUSION  2004   Dr. Dutch Quint   BACK SURGERY     BREAST BIOPSY Bilateral    COLONOSCOPY     CORONARY ANGIOPLASTY WITH STENT PLACEMENT  02/06/2017   CORONARY STENT INTERVENTION N/A 02/06/2017   Procedure: CORONARY STENT INTERVENTION;  Surgeon: Lyn Records, MD;  Location: [facility];  Service: Cardiovascular;  Laterality: N/A;   FOOT SURGERY Right 09/2011    Dr. Ria Bush, DPO, removal of "mass"    FOOT SURGERY Left 1970s X 1   FOOT SURGERY Right 2000s "multiple"   "botched 1st time; tried to correct several times"   LEFT HEART CATH AND CORONARY ANGIOGRAPHY N/A 02/06/2017   Procedure: LEFT HEART CATH AND CORONARY ANGIOGRAPHY;  Surgeon: Lyn Records, MD;  Location: [facility];  Service: Cardiovascular;  Laterality: N/A;   THYROIDECTOMY, PARTIAL  06/2009   "removed goiter"   TOTAL ABDOMINAL HYSTERECTOMY     TOTAL ABDOMINAL HYSTERECTOMY W/ BILATERAL SALPINGOOPHORECTOMY     Prior to Admission medications   Medication Sig Start Date End Date Taking? Authorizing Provider  fluticasone (FLONASE) 50 MCG/ACT nasal spray Place 2 sprays into both nostrils daily. 08/28/21  Yes Wallis Bamberg, PA-C  hydrocortisone 2.5 % cream APPLY TOPICALLY TWICE A DAY 05/09/22  Yes Myrlene Broker, MD  levothyroxine (SYNTHROID) 100 MCG tablet TAKE 1 TABLET BY MOUTH EVERY DAY BEFORE BREAKFAST 05/09/22  Yes Myrlene Broker, MD  Turmeric Curcumin (TURMERIC COMPLEX/BLACK PEPPER) 08-998 MG CAPS Take 1 capsule by mouth in the morning and at bedtime.   Yes [provider]  varenicline (CHANTIX CONTINUING MONTH PAK) 1 MG tablet Take 1 tablet (1 mg total) by mouth 2 (two) times daily. 12/28/22  Yes Myrlene Broker, MD   alendronate (FOSAMAX) 70 MG tablet Take 70 mg by mouth once a week. 12/09/20   [provider]  clopidogrel (PLAVIX) 75 MG tablet TAKE 1 TABLET BY MOUTH DAILY WITH BREAKFAST. 12/15/22   Revankar, Aundra Dubin, MD  Hyoscyamine Sulfate SL 0.125 MG SUBL Place 1-2 tablets under the tongue every 4 (four) hours as needed for cramping (stomach cramps).    [provider]  ibuprofen (ADVIL) 200 MG tablet Take 200 mg by mouth every 6 (six) hours as needed.    [provider]  nitroGLYCERIN (NITROSTAT) 0.4 MG SL tablet Place 1 tablet (0.4 mg total) under the tongue every 5 (five) minutes as needed for chest pain. 09/05/22   Revankar, Aundra Dubin, MD  Vitamin D, Ergocalciferol, (DRISDOL) 1.25 MG (50000 UNIT) CAPS capsule Take 1 capsule (50,000 Units total) by mouth every 7 (seven) days. Patient not taking: Reported on 02/06/2023 04/10/22   Myrlene Broker, MD    Current Outpatient Medications  Medication Sig Dispense Refill   fluticasone (FLONASE) 50 MCG/ACT nasal spray Place 2 sprays into both nostrils daily. 16 g 12   hydrocortisone 2.5 % cream APPLY TOPICALLY TWICE A DAY 84 g 0   levothyroxine (SYNTHROID) 100 MCG tablet TAKE 1 TABLET BY MOUTH EVERY DAY BEFORE BREAKFAST 90 tablet 3   Turmeric Curcumin (TURMERIC COMPLEX/BLACK PEPPER) 08-998 MG CAPS Take 1 capsule by mouth in the morning and at bedtime.     varenicline (CHANTIX CONTINUING MONTH PAK) 1 MG tablet Take 1 tablet (1 mg total) by mouth 2 (two) times daily. 60 tablet 6   alendronate (FOSAMAX) 70 MG tablet Take 70 mg by mouth once a week.     clopidogrel (PLAVIX) 75 MG tablet TAKE 1 TABLET BY MOUTH DAILY WITH BREAKFAST. 90 tablet 3   Hyoscyamine Sulfate SL 0.125 MG SUBL Place 1-2 tablets under the tongue every 4 (four) hours as needed for cramping (stomach cramps).     ibuprofen (ADVIL) 200 MG tablet Take 200 mg by mouth every 6 (six) hours as needed.     nitroGLYCERIN (NITROSTAT) 0.4 MG SL tablet Place 1 tablet (0.4 mg total)  under the tongue every 5 (five) minutes as needed for chest pain. 25 tablet 11   Vitamin D, Ergocalciferol, (DRISDOL) 1.25 MG (50000 UNIT) CAPS capsule Take 1 capsule (50,000 Units total) by mouth every 7 (seven) days. (Patient not taking: Reported on 02/06/2023) 12 capsule 0   Current Facility-Administered Medications  Medication Dose Route Frequency Provider Last Rate Last Admin   0.9 %  sodium chloride infusion  500 mL Intravenous Continuous Meryl Dare, MD        Allergies as of 03/13/2023 - Review Complete 03/13/2023  Allergen Reaction Noted   Codeine  09/18/2008    Family History  Problem Relation Age of Onset   Diabetes Mother    Diabetes Father    Heart disease Father  CHF   Breast cancer Sister    Diabetes Brother    Heart disease Brother    Hypertension Brother    Hyperlipidemia Brother    Hypertension Brother    Lung cancer Other        uncle   Breast cancer Other        great aunt   Diabetes Other        grandparents   Heart disease Other        grandfather   Colon cancer Neg Hx    Stomach cancer Neg Hx    Rectal cancer Neg Hx    Esophageal cancer Neg Hx     Social History   Socioeconomic History   Marital status: Divorced    Spouse name: Not on file   Number of children: 1   Years of education: 12   Highest education level: Not on file  Occupational History   Occupation: filed Catering manager: [employer]  Tobacco Use   Smoking status: Former    Current packs/day: 0.00    Average packs/day: 0.5 packs/day for 45.0 years (22.5 ttl pk-yrs)    Types: Cigarettes    Start date: 12/31/2018    Quit date: 05/29/2019    Years since quitting: 3.7   Smokeless tobacco: Never   Tobacco comments:    taking Chantix  Vaping Use   Vaping status: Never Used  Substance and Sexual Activity   Alcohol use: Yes    Alcohol/week: 5.0 standard drinks of alcohol    Types: 5 Standard drinks or equivalent per week    Comment: occ   Drug use: Yes     Types: Marijuana    Comment: socially   Sexual activity: Not Currently    Partners: Male  Other Topics Concern   Not on file  Social History Narrative   [employer], [employer] -BS social work. Married 1980 - 16 yrs/divorced. 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 Lives alone. Dating - long term relationship/[place]. Unprotected. No history of physical or sexual abuse.    Social Determinants of Health   Financial Resource Strain: Not on file  Food Insecurity: Not on file  Transportation Needs: Not on file  Physical Activity: Not on file  Stress: Not on file  Social Connections: Not on file  Intimate Partner Violence: Not on file    Review of Systems:  All systems reviewed were negative except where noted in HPI.   Physical Exam:  General:  Alert, well-developed, in NAD Head:  Normocephalic and atraumatic. Eyes:  Sclera clear, no icterus.   Conjunctiva pink. Ears:  Normal auditory acuity. Mouth:  No deformity or lesions.  Neck:  Supple; no masses. Lungs:  Clear throughout to auscultation.   No wheezes, crackles, or rhonchi.  Heart:  Regular rate and rhythm; no murmurs. Abdomen:  Soft, nondistended, nontender. No masses, hepatomegaly. No palpable masses.  Normal bowel sounds.    Rectal:  Deferred   Msk:  Symmetrical without gross deformities. Extremities:  Without edema. Neurologic:  Alert and  oriented x 4; grossly normal neurologically. Skin:  Intact without significant lesions or rashes. Psych:  Alert and cooperative. Normal mood and affect.   Venita Lick. Russella Dar  03/13/2023, 2:00 PM See Loretha Stapler, [facility], to contact our on call provider

## 2023-03-13 NOTE — Progress Notes (Signed)
A/ox3, pleased with MAC, report to RN 

## 2023-03-13 NOTE — Patient Instructions (Signed)
Resume previous diet Continue present medications Await pathology results  Handouts/information given for polyps, diverticulosis   YOU HAD AN ENDOSCOPIC PROCEDURE TODAY AT THE [facility]:   Refer to the procedure report that was given to you for any specific questions about what was found during the examination.  If the procedure report does not answer your questions, please call your gastroenterologist to clarify.  If you requested that your care partner not be given the details of your procedure findings, then the procedure report has been included in a sealed envelope for you to review at your convenience later.  YOU SHOULD EXPECT: Some feelings of bloating in the abdomen. Passage of more gas than usual.  Walking can help get rid of the air that was put into your GI tract during the procedure and reduce the bloating. If you had a lower endoscopy (such as a colonoscopy or flexible sigmoidoscopy) you may notice spotting of blood in your stool or on the toilet paper. If you underwent a bowel prep for your procedure, you may not have a normal bowel movement for a few days.  Please Note:  You might notice some irritation and congestion in your nose or some drainage.  This is from the oxygen used during your procedure.  There is no need for concern and it should clear up in a day or so.  SYMPTOMS TO REPORT IMMEDIATELY:  Following lower endoscopy (colonoscopy or flexible sigmoidoscopy):  Excessive amounts of blood in the stool  Significant tenderness or worsening of abdominal pains  Swelling of the abdomen that is new, acute  Fever of 100F or higher  For urgent or emergent issues, a gastroenterologist can be reached at any hour by calling [phone]. Do not use MyChart messaging for urgent concerns.    DIET:  We do recommend a small meal at first, but then you may proceed to your regular diet.  Drink plenty of fluids but you should avoid alcoholic beverages for 24  hours.  ACTIVITY:  You should plan to take it easy for the rest of today and you should NOT DRIVE or use heavy machinery until tomorrow (because of the sedation medicines used during the test).    FOLLOW UP: Our staff will call the number listed on your records the next business day following your procedure.  We will call around 7:15- 8:00 am to check on you and address any questions or concerns that you may have regarding the information given to you following your procedure. If we do not reach you, we will leave a message.     If any biopsies were taken you will be contacted by phone or by letter within the next 1-3 weeks.  Please call us at [phone] if you have not heard about the biopsies in 3 weeks.    SIGNATURES/CONFIDENTIALITY: You and/or your care partner have signed paperwork which will be entered into your electronic medical record.  These signatures attest to the fact that that the information above on your After Visit Summary has been reviewed and is understood.  Full responsibility of the confidentiality of this discharge information lies with you and/or your care-partner. 

## 2023-03-13 NOTE — Progress Notes (Signed)
VS by [facility]

## 2023-03-13 NOTE — Progress Notes (Signed)
NIBP inaccurate, BP's taken manually

## 2023-03-13 NOTE — Progress Notes (Signed)
Called to room to assist during endoscopic procedure.  Patient ID and intended procedure confirmed with present staff. Received instructions for my participation in the procedure from the performing physician.  

## 2023-03-14 ENCOUNTER — Telehealth: Payer: Self-pay

## 2023-03-14 NOTE — Telephone Encounter (Signed)
Left message on follow up call. 

## 2023-03-16 LAB — SURGICAL PATHOLOGY

## 2023-03-22 DIAGNOSIS — L89892 Pressure ulcer of other site, stage 2: Secondary | ICD-10-CM | POA: Diagnosis not present

## 2023-03-22 DIAGNOSIS — M79671 Pain in right foot: Secondary | ICD-10-CM | POA: Diagnosis not present

## 2023-03-27 ENCOUNTER — Encounter: Payer: Self-pay | Admitting: Gastroenterology

## 2023-04-04 ENCOUNTER — Ambulatory Visit: Payer: Medicare HMO | Admitting: Internal Medicine

## 2023-04-05 ENCOUNTER — Ambulatory Visit (INDEPENDENT_AMBULATORY_CARE_PROVIDER_SITE_OTHER): Payer: Medicare HMO | Admitting: Internal Medicine

## 2023-04-05 ENCOUNTER — Encounter: Payer: Self-pay | Admitting: Internal Medicine

## 2023-04-05 VITALS — BP 142/86 | HR 76 | Temp 98.4°F | Ht 65.0 in | Wt 183.0 lb

## 2023-04-05 DIAGNOSIS — E039 Hypothyroidism, unspecified: Secondary | ICD-10-CM | POA: Diagnosis not present

## 2023-04-05 DIAGNOSIS — E782 Mixed hyperlipidemia: Secondary | ICD-10-CM | POA: Diagnosis not present

## 2023-04-05 DIAGNOSIS — Z720 Tobacco use: Secondary | ICD-10-CM

## 2023-04-05 DIAGNOSIS — Z23 Encounter for immunization: Secondary | ICD-10-CM

## 2023-04-05 DIAGNOSIS — I2510 Atherosclerotic heart disease of native coronary artery without angina pectoris: Secondary | ICD-10-CM | POA: Diagnosis not present

## 2023-04-05 DIAGNOSIS — I10 Essential (primary) hypertension: Secondary | ICD-10-CM

## 2023-04-05 DIAGNOSIS — E559 Vitamin D deficiency, unspecified: Secondary | ICD-10-CM

## 2023-04-05 DIAGNOSIS — R7303 Prediabetes: Secondary | ICD-10-CM

## 2023-04-05 DIAGNOSIS — Z0000 Encounter for general adult medical examination without abnormal findings: Secondary | ICD-10-CM | POA: Diagnosis not present

## 2023-04-05 MED ORDER — LEVOTHYROXINE SODIUM 100 MCG PO TABS: 100.0000 ug | ORAL_TABLET | Freq: Every day | ORAL | 3 refills | Status: AC

## 2023-04-05 MED ORDER — HYDROCORTISONE 2.5 % EX CREA: TOPICAL_CREAM | Freq: Two times a day (BID) | CUTANEOUS | 3 refills | Status: AC

## 2023-04-05 NOTE — Progress Notes (Signed)
   Subjective:   Patient ID: Deborah Stewart, female    DOB: 22-Apr-1956, 67 y.o.   MRN: [id number]  HPI The patient is here for physical.  PMH, [facility], social history reviewed and updated  Review of Systems  Constitutional: Negative.   HENT: Negative.    Eyes: Negative.   Respiratory:  Negative for cough, chest tightness and shortness of breath.   Cardiovascular:  Negative for chest pain, palpitations and leg swelling.  Gastrointestinal:  Negative for abdominal distention, abdominal pain, constipation, diarrhea, nausea and vomiting.  Musculoskeletal: Negative.   Skin: Negative.   Neurological: Negative.   Psychiatric/Behavioral: Negative.      Objective:  Physical Exam Constitutional:      Appearance: She is well-developed.  HENT:     Head: Normocephalic and atraumatic.  Cardiovascular:     Rate and Rhythm: Normal rate and regular rhythm.  Pulmonary:     Effort: Pulmonary effort is normal. No respiratory distress.     Breath sounds: Normal breath sounds. No wheezing or rales.  Abdominal:     General: Bowel sounds are normal. There is no distension.     Palpations: Abdomen is soft.     Tenderness: There is no abdominal tenderness. There is no rebound.  Musculoskeletal:     Cervical back: Normal range of motion.  Skin:    General: Skin is warm and dry.  Neurological:     Mental Status: She is alert and oriented to person, place, and time.     Coordination: Coordination normal.     Vitals:   04/05/23 0935 04/05/23 0939  BP: (!) 142/86 (!) 142/86  Pulse: 76   Temp: 98.4 F (36.9 C)   TempSrc: Oral   SpO2: 98%   Weight: 183 lb (83 kg)   Height: 5\' 5"  (1.651 m)     Assessment & Plan:  Prevnar 20 given at visit

## 2023-04-06 NOTE — Assessment & Plan Note (Signed)
Counseled to quit and is unable to make attempt at this time. Reminded about risk/harm of smoking.

## 2023-04-06 NOTE — Assessment & Plan Note (Signed)
Checking Hga1C and adjust as needed.

## 2023-04-06 NOTE — Assessment & Plan Note (Signed)
Checking TSH and adjust as needed synthroid 100 mcg daily.

## 2023-04-06 NOTE — Assessment & Plan Note (Signed)
Checking vitamin D and adjust as needed. 

## 2023-04-06 NOTE — Assessment & Plan Note (Signed)
BP close to goal and is taking some otc lately. Will keep regimen the same without medications. Checking CMP and adjust as needed.

## 2023-04-06 NOTE — Assessment & Plan Note (Signed)
No anginal pains and taking plavix will continue.

## 2023-04-06 NOTE — Assessment & Plan Note (Signed)
Flu shot declines. Pneumonia given 20. Shingrix declines. Tetanus declines. Colonoscopy up to date. Mammogram due, pap smear aged out and dexa up to date. Counseled about sun safety and mole surveillance. Counseled about the dangers of distracted driving. Given 10 year screening recommendations.

## 2023-04-06 NOTE — Assessment & Plan Note (Signed)
Checking lipid panel and adjust as needed.  

## 2023-04-19 DIAGNOSIS — M79671 Pain in right foot: Secondary | ICD-10-CM | POA: Diagnosis not present

## 2023-04-19 DIAGNOSIS — L89892 Pressure ulcer of other site, stage 2: Secondary | ICD-10-CM | POA: Diagnosis not present

## 2023-04-29 ENCOUNTER — Ambulatory Visit
Admission: EM | Admit: 2023-04-29 | Discharge: 2023-04-29 | Disposition: A | Discharge status: Home / Self Care | Payer: Self-pay | Attending: Physician Assistant | Admitting: Physician Assistant

## 2023-04-29 ENCOUNTER — Ambulatory Visit (INDEPENDENT_AMBULATORY_CARE_PROVIDER_SITE_OTHER): Payer: Medicare HMO

## 2023-04-29 DIAGNOSIS — J208 Acute bronchitis due to other specified organisms: Secondary | ICD-10-CM

## 2023-04-29 DIAGNOSIS — R051 Acute cough: Secondary | ICD-10-CM

## 2023-04-29 DIAGNOSIS — R0789 Other chest pain: Secondary | ICD-10-CM | POA: Diagnosis not present

## 2023-04-29 DIAGNOSIS — R059 Cough, unspecified: Secondary | ICD-10-CM | POA: Diagnosis not present

## 2023-04-29 DIAGNOSIS — R0781 Pleurodynia: Secondary | ICD-10-CM | POA: Diagnosis not present

## 2023-04-29 MED ORDER — BENZONATATE 100 MG PO CAPS: 100.0000 mg | ORAL_CAPSULE | Freq: Three times a day (TID) | ORAL | 0 refills | Status: DC

## 2023-04-29 MED ORDER — ALBUTEROL SULFATE HFA 108 (90 BASE) MCG/ACT IN AERS: 1.0000 | INHALATION_SPRAY | Freq: Four times a day (QID) | RESPIRATORY_TRACT | 0 refills | Status: AC | PRN

## 2023-04-29 MED ORDER — IPRATROPIUM-ALBUTEROL 0.5-2.5 (3) MG/3ML IN SOLN
3.0000 mL | Freq: Once | RESPIRATORY_TRACT | Status: AC
  Administered 2023-04-29: 3 mL via RESPIRATORY_TRACT

## 2023-04-29 MED ORDER — PREDNISONE 20 MG PO TABS: 20.0000 mg | ORAL_TABLET | Freq: Every day | ORAL | 0 refills | Status: AC

## 2023-04-29 MED ORDER — LIDOCAINE 5 % EX PTCH: 1.0000 | MEDICATED_PATCH | CUTANEOUS | 0 refills | Status: AC

## 2023-04-29 NOTE — Discharge Instructions (Signed)
Your x-ray did not show any evidence of a broken bone or pneumonia.  I suspect you have bronchitis.  Please use albuterol every 4-6 hours as needed.  Start prednisone 20 mg for 5 days.  Do not take NSAIDs with this medication including aspirin, ibuprofen/Advil, naproxen/Aleve.  Use Tessalon up to 3 times a day for cough.  You can use over-the-counter medication including Mucinex, Flonase, Tylenol.  Apply lidocaine patch for 12 hours during the day to help with your pain.  Remove this for minimum of 12 hours at night and only use 1 patch per 24 hours.  If your symptoms are not improving within 3 to 5 days please return for reevaluation.  If anything worsens and you have worsening cough, chest pain, shortness of breath, high fever, weakness you need to go to the emergency room.

## 2023-04-29 NOTE — ED Provider Notes (Signed)
[name]    CSN: [id number] Arrival date & time: 04/29/23  0807      History   Chief Complaint No chief complaint on file.   HPI Deborah Stewart is a 67 y.o. female.   Patient presents today with a 5-day history of worsening cough.  Reports associated congestion, sore throat.  Her primary concern today is that she is not experiencing pain in her right anterior chest wall worse when she coughs or sneezes.  She was exposed to her grandchildren who were sick with a viral illness and believes that she caught it from them.  She has had COVID-19 vaccinations but not most recent booster.  She is up-to-date on immunizations for pneumonia.  She has been taking over-the-counter medications including tea, Tylenol, Coricidin, chewing fresh garlic without improvement.  She denies history of asthma or COPD but does smoke.  Denies any recent steroid use but was treated for sinus infection 01/03/2023 with doxycycline.  Denies additional antibiotics in the past several months.  She is having difficulty with daily activities as a result of her symptoms.    Past Medical History:  Diagnosis Date   Chronic back pain 07/08/2012   Pt has had lower back pain from a herniated disc for years, she is s/p cervical diskectomy.    Chronic left shoulder pain 07/15/2015   Chronic pain of left knee 07/15/2015   Coronary artery disease    10/18 PCI/DESx1 to mRCA.    DDD (degenerative disc disease), cervical    s/p diskectomy    Essential hypertension 02/01/2017   Health care maintenance 07/08/2012   Pt is being followed by a gastroenterologist for her ulcerative colitis they manage her colonoscopies.  Pt has an appointment with her OB/Gyn in July.    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   Hyperlipidemia    Hypertension    Hypothyroidism 11/10/2008   Qualifier: Diagnosis of  By: Jarold Motto MD Mervyn Skeeters R  Chronic problem s/p partial thyroidectomy     Intermittent claudication (HCC)  07/10/2019   Lesion of vulva 09/25/2017   Numbness of fingers 05/21/2020   Obesity (BMI 30.0-34.9) 07/13/2020   Osteoporosis 08/28/2016   Personal history of colonic polyps 06/2010   hyperplastic, tubular adenoma   Pre-diabetes 11/04/2018   Right foot pain 11/18/2018   Snoring disorder 01/14/2013   Solitary pulmonary nodule 04/10/2022   Thyroid nodule    Tobacco abuse 09/20/2010   Pt is a current 1 ppd smoker, has tried to quit in the past with some success.  She has tried nicotine gum, nicotine patches (caused a rash), and Chantix (had the greatest success with Chantix).     02/03/13: Patient reports that she is down to 5 cigarettes weekly now, and has been improving with Chantix. She is strongly motivated to quit.    Ulcerative colitis (HCC)    Vitamin D deficiency 08/30/2016    Patient Active Problem List   Diagnosis Date Noted   Solitary pulmonary nodule 04/10/2022   Obesity (BMI 30.0-34.9) 07/13/2020   Numbness of fingers 05/21/2020   Coronary artery disease    DDD (degenerative disc disease), cervical    Hyperlipidemia    Hypertension    Thyroid nodule    Ulcerative colitis (HCC)    Intermittent claudication (HCC) 07/10/2019   Right foot pain 11/18/2018   Pre-diabetes 11/04/2018   Lesion of vulva 09/25/2017   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   Vitamin D deficiency 08/30/2016   Osteoporosis 08/28/2016  Chronic left shoulder pain 07/15/2015   Chronic pain of left knee 07/15/2015   Snoring 01/14/2013   Health care maintenance 07/08/2012   Chronic back pain 07/08/2012   Tobacco abuse 09/20/2010   History of colonic polyps 06/2010   Hypothyroidism 11/10/2008    Past Surgical History:  Procedure Laterality Date   ANTERIOR CERVICAL DECOMP/DISCECTOMY FUSION  2004   Dr. Dutch Quint   BACK SURGERY     BREAST BIOPSY Bilateral    COLONOSCOPY     CORONARY ANGIOPLASTY WITH STENT PLACEMENT  02/06/2017   CORONARY STENT INTERVENTION N/A 02/06/2017   Procedure: CORONARY  STENT INTERVENTION;  Surgeon: Lyn Records, MD;  Location: [facility];  Service: Cardiovascular;  Laterality: N/A;   FOOT SURGERY Right 09/2011    Dr. Ria Bush, DPO, removal of "mass"    FOOT SURGERY Left 1970s X 1   FOOT SURGERY Right 2000s "multiple"   "botched 1st time; tried to correct several times"   LEFT HEART CATH AND CORONARY ANGIOGRAPHY N/A 02/06/2017   Procedure: LEFT HEART CATH AND CORONARY ANGIOGRAPHY;  Surgeon: Lyn Records, MD;  Location: [facility];  Service: Cardiovascular;  Laterality: N/A;   THYROIDECTOMY, PARTIAL  06/2009   "removed goiter"   TOTAL ABDOMINAL HYSTERECTOMY     TOTAL ABDOMINAL HYSTERECTOMY W/ BILATERAL SALPINGOOPHORECTOMY     OB History     Gravida  1   Para  1   Term      Preterm      AB      Living         SAB      IAB      Ectopic      Multiple      Live Births               Home Medications    Prior to Admission medications   Medication Sig Start Date End Date Taking? Authorizing Provider  albuterol (VENTOLIN HFA) 108 (90 Base) MCG/ACT inhaler Inhale 1-2 puffs into the lungs every 6 (six) hours as needed for wheezing or shortness of breath. 04/29/23  Yes Terrell Ostrand K, PA-C  benzonatate (TESSALON) 100 MG capsule Take 1 capsule (100 mg total) by mouth every 8 (eight) hours. 04/29/23  Yes Aliviya Schoeller K, PA-C  lidocaine (LIDODERM) 5 % Place 1 patch onto the skin daily. Remove & Discard patch within 12 hours or as directed by MD 04/29/23  Yes Nazarene Bunning, Denny Peon K, PA-C  predniSONE (DELTASONE) 20 MG tablet Take 1 tablet (20 mg total) by mouth daily for 5 days. 04/29/23 05/04/23 Yes Tramaine Sauls, Noberto Retort, PA-C  alendronate (FOSAMAX) 70 MG tablet Take 70 mg by mouth once a week. 12/09/20   [provider]  clopidogrel (PLAVIX) 75 MG tablet TAKE 1 TABLET BY MOUTH DAILY WITH BREAKFAST. 12/15/22   Revankar, Aundra Dubin, MD  fluticasone (FLONASE) 50 MCG/ACT nasal spray Place 2 sprays into both nostrils daily. 08/28/21    Wallis Bamberg, PA-C  hydrocortisone 2.5 % cream Apply topically 2 (two) times daily. 04/05/23   Myrlene Broker, MD  Hyoscyamine Sulfate SL 0.125 MG SUBL Place 1-2 tablets under the tongue every 4 (four) hours as needed for cramping (stomach cramps).    [provider]  ibuprofen (ADVIL) 200 MG tablet Take 200 mg by mouth every 6 (six) hours as needed.    [provider]  levothyroxine (SYNTHROID) 100 MCG tablet Take 1 tablet (100 mcg total) by mouth daily before breakfast.  04/05/23   Myrlene Broker, MD  nitroGLYCERIN (NITROSTAT) 0.4 MG SL tablet Place 1 tablet (0.4 mg total) under the tongue every 5 (five) minutes as needed for chest pain. 09/05/22   Revankar, Aundra Dubin, MD  Turmeric Curcumin (TURMERIC COMPLEX/BLACK PEPPER) 08-998 MG CAPS Take 1 capsule by mouth in the morning and at bedtime.    [provider]    Family History Family History  Problem Relation Age of Onset   Diabetes Mother    Diabetes Father    Heart disease Father        CHF   Breast cancer Sister    Diabetes Brother    Heart disease Brother    Hypertension Brother    Hyperlipidemia Brother    Hypertension Brother    Lung cancer Other        uncle   Breast cancer Other        great aunt   Diabetes Other        grandparents   Heart disease Other        grandfather   Colon cancer Neg Hx    Stomach cancer Neg Hx    Rectal cancer Neg Hx    Esophageal cancer Neg Hx     Social History Social History   Tobacco Use   Smoking status: Former    Current packs/day: 0.00    Average packs/day: 0.5 packs/day for 45.0 years (22.5 ttl pk-yrs)    Types: Cigarettes    Start date: 12/31/2018    Quit date: 05/29/2019    Years since quitting: 3.9   Smokeless tobacco: Never   Tobacco comments:    taking Chantix  Vaping Use   Vaping status: Never Used  Substance Use Topics   Alcohol use: Yes    Alcohol/week: 5.0 standard drinks of alcohol    Types: 5 Standard drinks or equivalent per  week    Comment: occ   Drug use: Yes    Types: Marijuana    Comment: socially     Allergies   Codeine   Review of Systems Review of Systems  Constitutional:  Positive for activity change. Negative for appetite change, fatigue and fever.  HENT:  Positive for congestion and sore throat. Negative for sinus pressure and sneezing.   Respiratory:  Positive for cough. Negative for shortness of breath.   Cardiovascular:  Positive for chest pain (rib pain only).  Gastrointestinal:  Negative for abdominal pain, diarrhea, nausea and vomiting.  Musculoskeletal:  Negative for arthralgias and myalgias.  Neurological:  Negative for dizziness, light-headedness and headaches.     Physical Exam Triage Vital Signs ED Triage Vitals  Encounter Vitals Group     BP 04/29/23 0824 (!) 153/88     Systolic BP Percentile --      Diastolic BP Percentile --      Pulse Rate 04/29/23 0824 77     Resp 04/29/23 0824 16     Temp 04/29/23 0824 98 F (36.7 C)     Temp Source 04/29/23 0824 Oral     SpO2 04/29/23 0824 94 %     Weight 04/29/23 0823 185 lb (83.9 kg)     Height 04/29/23 0823 5\' 5"  (1.651 m)     Head Circumference --      Peak Flow --      Pain Score 04/29/23 0823 10     Pain Loc --      Pain Education --      Exclude from [employer]  Chart --    No data found.  Updated Vital Signs BP (!) 153/88 (BP Location: Left Arm)   Pulse 77   Temp 98 F (36.7 C) (Oral)   Resp 16   Ht 5\' 5"  (1.651 m)   Wt 185 lb (83.9 kg)   SpO2 94%   BMI 30.79 kg/m   Visual Acuity Right Eye Distance:   Left Eye Distance:   Bilateral Distance:    Right Eye Near:   Left Eye Near:    Bilateral Near:     Physical Exam Vitals reviewed.  Constitutional:      General: She is awake. She is not in acute distress.    Appearance: Normal appearance. She is well-developed. She is not ill-appearing.     Comments: Very pleasant female appears stated age in no acute distress sitting comfortably in exam room  HENT:      Head: Normocephalic and atraumatic.     Right Ear: Tympanic membrane, ear canal and external ear normal. Tympanic membrane is not erythematous or bulging.     Left Ear: Tympanic membrane, ear canal and external ear normal. Tympanic membrane is not erythematous or bulging.     Nose:     Right Sinus: No maxillary sinus tenderness or frontal sinus tenderness.     Left Sinus: No maxillary sinus tenderness or frontal sinus tenderness.     Mouth/Throat:     Pharynx: Uvula midline. Postnasal drip present. No oropharyngeal exudate or posterior oropharyngeal erythema.  Cardiovascular:     Rate and Rhythm: Normal rate and regular rhythm.     Heart sounds: Normal heart sounds, S1 normal and S2 normal. No murmur heard. Pulmonary:     Effort: Pulmonary effort is normal.     Breath sounds: Wheezing present. No rhonchi or rales.  Chest:     Chest wall: Tenderness present. No deformity or swelling.    Psychiatric:        Behavior: Behavior is cooperative.      UC Treatments / Results  Labs (all labs ordered are listed, but only abnormal results are displayed) Labs Reviewed - No data to display  EKG   Radiology DG Ribs Unilateral W/Chest Right Result Date: 04/29/2023 CLINICAL DATA:  Pain in the ribs from coughing. EXAM: RIGHT RIBS AND CHEST - 3+ VIEW COMPARISON:  04/03/2022. FINDINGS: Bilateral lung fields are clear. Bilateral costophrenic angles are clear. Normal cardio-mediastinal silhouette. No acute osseous abnormalities. No acute rib fracture or focal rib lesion. Partially seen lower cervical spinal fixation hardware. The soft tissues are within normal limits. IMPRESSION: *No acute cardiopulmonary abnormality. *No acute rib fracture or focal rib lesion. Electronically Signed   By: Jules Schick M.D.   On: 04/29/2023 09:19    Procedures Procedures (including critical care time)  Medications Ordered in UC Medications  ipratropium-albuterol (DUONEB) 0.5-2.5 (3) MG/3ML nebulizer  solution 3 mL (3 mLs Nebulization Given 04/29/23 0900)    Initial Impression / Assessment and Plan / UC Course  I have reviewed the triage vital signs and the nursing notes.  Pertinent labs & imaging results that were available during my care of the patient were reviewed by me and considered in my medical decision making (see chart for details).     Patient is well-appearing, afebrile, nontoxic, nontachycardic.  Viral testing was deferred as she has already been symptomatic for 5 days and this would not change our management.  Chest x-ray was obtained given her worsening cough as well as focal chest wall pain  that did not show any abnormality including no fractured rib or acute cardiopulmonary disease.  Suspect viral bronchitis as etiology of symptoms.  She was given a DuoNeb in clinic with significant improvement of symptoms.  She was sent home with albuterol to use this every 4-6 hours as needed.  Will start low-dose prednisone of 20 mg for 5 days to help with symptoms and we discussed that she should not take NSAIDs with this medication due to risk of GI bleeding.  She can use over-the-counter medication including Mucinex, Flonase, Tylenol for additional symptom relief.  She was given Tessalon for cough.  She was given lidocaine patches for chest wall pain relief.  Recommend close follow-up with her primary care.  Discussed that if she is not improving within 3 to 5 days she should return for reevaluation.  If anything worsens she needs to be seen immediately to which she expressed understanding.  Strict return precautions given.  Patient expressed understanding and agreement to treatment plan.  Final Clinical Impressions(s) / UC Diagnoses   Final diagnoses:  Chest wall pain  Acute viral bronchitis  Acute cough     Discharge Instructions      Your x-ray did not show any evidence of a broken bone or pneumonia.  I suspect you have bronchitis.  Please use albuterol every 4-6 hours as needed.   Start prednisone 20 mg for 5 days.  Do not take NSAIDs with this medication including aspirin, ibuprofen/Advil, naproxen/Aleve.  Use Tessalon up to 3 times a day for cough.  You can use over-the-counter medication including Mucinex, Flonase, Tylenol.  Apply lidocaine patch for 12 hours during the day to help with your pain.  Remove this for minimum of 12 hours at night and only use 1 patch per 24 hours.  If your symptoms are not improving within 3 to 5 days please return for reevaluation.  If anything worsens and you have worsening cough, chest pain, shortness of breath, high fever, weakness you need to go to the emergency room.     ED Prescriptions     Medication Sig Dispense Auth. Provider   albuterol (VENTOLIN HFA) 108 (90 Base) MCG/ACT inhaler Inhale 1-2 puffs into the lungs every 6 (six) hours as needed for wheezing or shortness of breath. 18 g Shequila Neglia K, PA-C   predniSONE (DELTASONE) 20 MG tablet Take 1 tablet (20 mg total) by mouth daily for 5 days. 5 tablet Tatem Fesler K, PA-C   benzonatate (TESSALON) 100 MG capsule Take 1 capsule (100 mg total) by mouth every 8 (eight) hours. 21 capsule Jaquaya Coyle K, PA-C   lidocaine (LIDODERM) 5 % Place 1 patch onto the skin daily. Remove & Discard patch within 12 hours or as directed by MD 30 patch Vlasta Baskin K, PA-C      PDMP not reviewed this encounter.   Jeani Hawking, PA-C 04/29/23 [id number]

## 2023-04-29 NOTE — ED Triage Notes (Addendum)
Patient presents with cough and congestion that started on Thursday, states her right rib hurts. Treated with Coricidin, fresh garlic, ginger tea, Tylenol and Flonase.

## 2023-05-01 ENCOUNTER — Ambulatory Visit
Admission: EM | Admit: 2023-05-01 | Discharge: 2023-05-01 | Disposition: A | Discharge status: Home / Self Care | Payer: Medicare HMO

## 2023-05-01 ENCOUNTER — Other Ambulatory Visit: Payer: Self-pay

## 2023-05-01 ENCOUNTER — Encounter: Payer: Self-pay | Admitting: Emergency Medicine

## 2023-05-01 DIAGNOSIS — R0789 Other chest pain: Secondary | ICD-10-CM | POA: Diagnosis not present

## 2023-05-01 NOTE — ED Triage Notes (Signed)
Pt here for continued cough with pain with cough; pt sts spasm type pain in right shoulder this am; painful to touch and also painful to touch in right rib area; pt sts had x ray at last visit

## 2023-05-06 NOTE — ED Provider Notes (Signed)
 [name]    CSN: [id number] Arrival date & time: 05/01/23  1411      History   Chief Complaint Chief Complaint  Patient presents with   Shoulder Pain    HPI Deborah Stewart is a 68 y.o. female.   Patient here today for evaluation of cough and requested for several days.  She reports that she also started to have a pain in her right shoulder that [facility] a spasms morning as well as some pain to her right rib area.  She did have x-ray at last visit.  She does not report fever.  The history is provided by the patient.  Shoulder Pain Associated symptoms: no fever     Past Medical History:  Diagnosis Date   Chronic back pain 07/08/2012   Pt has had lower back pain from a herniated disc for years, she is s/p cervical diskectomy.    Chronic left shoulder pain 07/15/2015   Chronic pain of left knee 07/15/2015   Coronary artery disease    10/18 PCI/DESx1 to mRCA.    DDD (degenerative disc disease), cervical    s/p diskectomy    Essential hypertension 02/01/2017   Health care maintenance 07/08/2012   Pt is being followed by a gastroenterologist for her ulcerative colitis they manage her colonoscopies.  Pt has an appointment with her OB/Gyn in July.    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   Hyperlipidemia    Hypertension    Hypothyroidism 11/10/2008   Qualifier: Diagnosis of  By: Jakie MD NOLIA Lenis R  Chronic problem s/p partial thyroidectomy     Intermittent claudication (HCC) 07/10/2019   Lesion of vulva 09/25/2017   Numbness of fingers 05/21/2020   Obesity (BMI 30.0-34.9) 07/13/2020   Osteoporosis 08/28/2016   Personal history of colonic polyps 06/2010   hyperplastic, tubular adenoma   Pre-diabetes 11/04/2018   Right foot pain 11/18/2018   Snoring disorder 01/14/2013   Solitary pulmonary nodule 04/10/2022   Thyroid  nodule    Tobacco abuse 09/20/2010   Pt is a current 1 ppd smoker, has tried to quit in the past with some success.  She has  tried nicotine  gum, nicotine  patches (caused a rash), and Chantix  (had the greatest success with Chantix ).     02/03/13: Patient reports that she is down to 5 cigarettes weekly now, and has been improving with Chantix . She is strongly motivated to quit.    Ulcerative colitis (HCC)    Vitamin D  deficiency 08/30/2016    Patient Active Problem List   Diagnosis Date Noted   Solitary pulmonary nodule 04/10/2022   Obesity (BMI 30.0-34.9) 07/13/2020   Numbness of fingers 05/21/2020   Coronary artery disease    DDD (degenerative disc disease), cervical    Hyperlipidemia    Hypertension    Thyroid  nodule    Ulcerative colitis (HCC)    Intermittent claudication (HCC) 07/10/2019   Right foot pain 11/18/2018   Pre-diabetes 11/04/2018   Lesion of vulva 09/25/2017   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   Vitamin D  deficiency 08/30/2016   Osteoporosis 08/28/2016   Chronic left shoulder pain 07/15/2015   Chronic pain of left knee 07/15/2015   Snoring 01/14/2013   Health care maintenance 07/08/2012   Chronic back pain 07/08/2012   Tobacco abuse 09/20/2010   History of colonic polyps 06/2010   Hypothyroidism 11/10/2008    Past Surgical History:  Procedure Laterality Date   ANTERIOR CERVICAL DECOMP/DISCECTOMY FUSION  2004   Dr. Malcolm  BACK SURGERY     BREAST BIOPSY Bilateral    COLONOSCOPY     CORONARY ANGIOPLASTY WITH STENT PLACEMENT  02/06/2017   CORONARY STENT INTERVENTION N/A 02/06/2017   Procedure: CORONARY STENT INTERVENTION;  Surgeon: Claudene Victory ORN, MD;  Location: [facility];  Service: Cardiovascular;  Laterality: N/A;   FOOT SURGERY Right 09/2011    Dr. Charlie Blowers, DPO, removal of mass    FOOT SURGERY Left 1970s X 1   FOOT SURGERY Right 2000s multiple   botched 1st time; tried to correct several times   LEFT HEART CATH AND CORONARY ANGIOGRAPHY N/A 02/06/2017   Procedure: LEFT HEART CATH AND CORONARY ANGIOGRAPHY;  Surgeon: Claudene Victory ORN, MD;  Location:  [facility];  Service: Cardiovascular;  Laterality: N/A;   THYROIDECTOMY, PARTIAL  06/2009   removed goiter   TOTAL ABDOMINAL HYSTERECTOMY     TOTAL ABDOMINAL HYSTERECTOMY W/ BILATERAL SALPINGOOPHORECTOMY     OB History     Gravida  1   Para  1   Term      Preterm      AB      Living         SAB      IAB      Ectopic      Multiple      Live Births               Home Medications    Prior to Admission medications   Medication Sig Start Date End Date Taking? Authorizing Provider  atorvastatin  (LIPITOR ) 80 MG tablet Take 80 mg by mouth daily. 04/12/23  Yes [provider]  varenicline  (CHANTIX ) 1 MG tablet Take 1 mg by mouth 2 (two) times daily. 04/18/23  Yes [provider]  albuterol  (VENTOLIN  HFA) 108 (90 Base) MCG/ACT inhaler Inhale 1-2 puffs into the lungs every 6 (six) hours as needed for wheezing or shortness of breath. 04/29/23   Raspet, Erin K, PA-C  alendronate (FOSAMAX) 70 MG tablet Take 70 mg by mouth once a week. 12/09/20   [provider]  benzonatate  (TESSALON ) 100 MG capsule Take 1 capsule (100 mg total) by mouth every 8 (eight) hours. 04/29/23   Raspet, Erin K, PA-C  clopidogrel  (PLAVIX ) 75 MG tablet TAKE 1 TABLET BY MOUTH DAILY WITH BREAKFAST. 12/15/22   Revankar, Jennifer SAUNDERS, MD  doxycycline  (VIBRAMYCIN ) 100 MG capsule Take 1 capsule by mouth 2 (two) times daily.    [provider]  fluticasone  (FLONASE ) 50 MCG/ACT nasal spray Place 2 sprays into both nostrils daily. 08/28/21   Christopher Savannah, PA-C  hydrocortisone  2.5 % cream Apply topically 2 (two) times daily. 04/05/23   Rollene Almarie LABOR, MD  Hyoscyamine  Sulfate SL 0.125 MG SUBL Place 1-2 tablets under the tongue every 4 (four) hours as needed for cramping (stomach cramps).    [provider]  ibuprofen  (ADVIL ) 200 MG tablet Take 200 mg by mouth every 6 (six) hours as needed.    [provider]  levothyroxine  (SYNTHROID ) 100 MCG tablet Take  1 tablet (100 mcg total) by mouth daily before breakfast. 04/05/23   Rollene Almarie LABOR, MD  lidocaine  (LIDODERM ) 5 % Place 1 patch onto the skin daily. Remove & Discard patch within 12 hours or as directed by MD 04/29/23   Raspet, Erin K, PA-C  Na Sulfate-K Sulfate-Mg Sulf 17.5-3.13-1.6 GM/177ML SOLN See admin instructions.    [provider]  nitroGLYCERIN  (NITROSTAT ) 0.4 MG SL tablet Place 1 tablet (  0.4 mg total) under the tongue every 5 (five) minutes as needed for chest pain. 09/05/22   Revankar, Rajan R, MD  Turmeric Curcumin (TURMERIC COMPLEX/BLACK PEPPER) 08-998 MG CAPS Take 1 capsule by mouth in the morning and at bedtime.    [provider]  varenicline  (CHANTIX ) 0.5 MG tablet Take 0.5 mg by mouth daily.    [provider]    Family History Family History  Problem Relation Age of Onset   Diabetes Mother    Diabetes Father    Heart disease Father        CHF   Breast cancer Sister    Diabetes Brother    Heart disease Brother    Hypertension Brother    Hyperlipidemia Brother    Hypertension Brother    Lung cancer Other        uncle   Breast cancer Other        great aunt   Diabetes Other        grandparents   Heart disease Other        grandfather   Colon cancer Neg Hx    Stomach cancer Neg Hx    Rectal cancer Neg Hx    Esophageal cancer Neg Hx     Social History Social History   Tobacco Use   Smoking status: Former    Current packs/day: 0.00    Average packs/day: 0.5 packs/day for 45.0 years (22.5 ttl pk-yrs)    Types: Cigarettes    Start date: 12/31/2018    Quit date: 05/29/2019    Years since quitting: 3.9   Smokeless tobacco: Never   Tobacco comments:    taking Chantix   Vaping Use   Vaping status: Never Used  Substance Use Topics   Alcohol use: Yes    Alcohol/week: 5.0 standard drinks of alcohol    Types: 5 Standard drinks or equivalent per week    Comment: occ   Drug use: Yes    Types: Marijuana    Comment: socially      Allergies   Codeine   Review of Systems Review of Systems  Constitutional:  Negative for chills and fever.  HENT:  Positive for congestion. Negative for sore throat.   Eyes:  Negative for discharge and redness.  Respiratory:  Positive for cough. Negative for shortness of breath.   Cardiovascular:  Positive for chest pain (chest wall pain- right).  Gastrointestinal:  Negative for abdominal pain, nausea and vomiting.     Physical Exam Triage Vital Signs ED Triage Vitals  Encounter Vitals Group     BP 05/01/23 1432 (!) 183/96     Systolic BP Percentile --      Diastolic BP Percentile --      Pulse Rate 05/01/23 1432 98     Resp 05/01/23 1432 (!) 22     Temp 05/01/23 1432 98.8 F (37.1 C)     Temp Source 05/01/23 1432 Oral     SpO2 05/01/23 1432 94 %     Weight --      Height --      Head Circumference --      Peak Flow --      Pain Score 05/01/23 1433 10     Pain Loc --      Pain Education --      Exclude from Growth Chart --    No data found.  Updated Vital Signs BP (!) 183/96 (BP Location: Left Arm)   Pulse 98   Temp  98.8 F (37.1 C) (Oral)   Resp (!) 22   SpO2 94%   Visual Acuity Right Eye Distance:   Left Eye Distance:   Bilateral Distance:    Right Eye Near:   Left Eye Near:    Bilateral Near:     Physical Exam Vitals and nursing note reviewed.  Constitutional:      General: She is not in acute distress.    Appearance: Normal appearance. She is not ill-appearing.  HENT:     Head: Normocephalic and atraumatic.     Nose: Congestion present.     Mouth/Throat:     Mouth: Mucous membranes are moist.     Pharynx: Oropharynx is clear. No oropharyngeal exudate or posterior oropharyngeal erythema.  Eyes:     Conjunctiva/sclera: Conjunctivae normal.  Cardiovascular:     Rate and Rhythm: Normal rate and regular rhythm.  Pulmonary:     Effort: Pulmonary effort is normal. No respiratory distress.     Breath sounds: Normal breath sounds. No  wheezing, rhonchi or rales.  Chest:     Chest wall: Tenderness (right lateral lower rib) present.  Neurological:     Mental Status: She is alert.  Psychiatric:        Mood and Affect: Mood normal.        Behavior: Behavior normal.        Thought Content: Thought content normal.      UC Treatments / Results  Labs (all labs ordered are listed, but only abnormal results are displayed) Labs Reviewed - No data to display  EKG   Radiology No results found.  Procedures Procedures (including critical care time)  Medications Ordered in UC Medications - No data to display  Initial Impression / Assessment and Plan / UC Course  I have reviewed the triage vital signs and the nursing notes.  Pertinent labs & imaging results that were available during my care of the patient were reviewed by me and considered in my medical decision making (see chart for details).    Suspect that right chest wall pain secondary to pulled muscle or costochondritis from cough.  Patient was treated with steroids at last visit a few days ago.  X-ray at that time did not reveal any fractures or pneumonia.  Recommended she continue to monitor symptoms and use treatment previously prescribed.  Encouraged follow-up if no gradual improvement with any further concerns.  Final Clinical Impressions(s) / UC Diagnoses   Final diagnoses:  Right-sided chest wall pain   Discharge Instructions   None    ED Prescriptions   None    PDMP not reviewed this encounter.   Billy Asberry FALCON, PA-C 05/06/23 0930

## 2023-05-15 ENCOUNTER — Ambulatory Visit
Admission: RE | Admit: 2023-05-15 | Discharge: 2023-05-15 | Disposition: A | Discharge status: Home / Self Care | Payer: Medicare Other | Source: Ambulatory Visit | Attending: Internal Medicine | Admitting: Internal Medicine

## 2023-05-15 ENCOUNTER — Ambulatory Visit (INDEPENDENT_AMBULATORY_CARE_PROVIDER_SITE_OTHER): Payer: Medicare Other | Admitting: Internal Medicine

## 2023-05-15 ENCOUNTER — Ambulatory Visit: Payer: Self-pay | Admitting: Internal Medicine

## 2023-05-15 ENCOUNTER — Encounter: Payer: Self-pay | Admitting: Internal Medicine

## 2023-05-15 VITALS — BP 130/80 | HR 70 | Temp 98.3°F | Ht 65.0 in | Wt 183.0 lb

## 2023-05-15 DIAGNOSIS — R071 Chest pain on breathing: Secondary | ICD-10-CM

## 2023-05-15 DIAGNOSIS — R052 Subacute cough: Secondary | ICD-10-CM | POA: Diagnosis not present

## 2023-05-15 DIAGNOSIS — R918 Other nonspecific abnormal finding of lung field: Secondary | ICD-10-CM | POA: Diagnosis not present

## 2023-05-15 DIAGNOSIS — I700 Atherosclerosis of aorta: Secondary | ICD-10-CM | POA: Diagnosis not present

## 2023-05-15 DIAGNOSIS — R059 Cough, unspecified: Secondary | ICD-10-CM | POA: Diagnosis not present

## 2023-05-15 DIAGNOSIS — Z720 Tobacco use: Secondary | ICD-10-CM

## 2023-05-15 DIAGNOSIS — J439 Emphysema, unspecified: Secondary | ICD-10-CM | POA: Diagnosis not present

## 2023-05-15 DIAGNOSIS — R911 Solitary pulmonary nodule: Secondary | ICD-10-CM | POA: Diagnosis not present

## 2023-05-15 HISTORY — DX: Subacute cough: R05.2

## 2023-05-15 LAB — COMPREHENSIVE METABOLIC PANEL
ALT: 16 U/L (ref 0–35)
AST: 18 U/L (ref 0–37)
Albumin: 4.1 g/dL (ref 3.5–5.2)
Alkaline Phosphatase: 71 U/L (ref 39–117)
BUN: 12 mg/dL (ref 6–23)
CO2: 30 meq/L (ref 19–32)
Calcium: 9.3 mg/dL (ref 8.4–10.5)
Chloride: 105 meq/L (ref 96–112)
Creatinine, Ser: 0.7 mg/dL (ref 0.40–1.20)
GFR: 89.57 mL/min (ref >60.00)
Glucose, Bld: 101 mg/dL — ABNORMAL HIGH (ref 70–99)
Potassium: 4.1 meq/L (ref 3.5–5.1)
Sodium: 142 meq/L (ref 135–145)
Total Bilirubin: 0.4 mg/dL (ref 0.2–1.2)
Total Protein: 7.1 g/dL (ref 6.0–8.3)

## 2023-05-15 MED ORDER — METHYLPREDNISOLONE 4 MG PO TBPK: ORAL_TABLET | ORAL | 0 refills | Status: DC

## 2023-05-15 MED ORDER — HYDROCODONE BIT-HOMATROP MBR 5-1.5 MG/5ML PO SOLN: 5.0000 mL | Freq: Three times a day (TID) | ORAL | 0 refills | Status: DC | PRN

## 2023-05-15 MED ORDER — AZITHROMYCIN 250 MG PO TABS: ORAL_TABLET | ORAL | 0 refills | Status: DC

## 2023-05-15 NOTE — Telephone Encounter (Signed)
  Chief Complaint: follow up on cough and rib pain; seen in ED 12/29 and 12/31 Symptoms: cough, right upper rib pain worse when coughing/movement Frequency: x3-4 weeks, improved and then returned over the last week Pertinent Negatives: Patient denies SOB, fevers, sore throat, runny nose, nasal congestion Disposition: [] ED /[] Urgent Care (no appt availability in office) / [x] Appointment(In office/virtual)/ []  [facility]/ [] Home Care/ [] Refused Recommended Disposition /[] [facility]/ []  Follow-up with PCP Additional Notes: Patient states she was seen in ED and has taken her meds (benzonatate , lidocaine  patch/cream) and states she had some improvement.   Copied from CRM [id number]. Topic: Clinical - Red Word Triage >> May 15, 2023  8:09 AM Corean SAUNDERS wrote: Reason for CRM: Severe pain in chest/ribs that's gets worse with a cough or bending down. Reason for Disposition  [1] Chest or rib pain AND [2] only occurs while coughing  Answer Assessment - Initial Assessment Questions 1. ONSET: When did the cough begin?      X 3-4 works, resolved and then came back over the past week.  2. SEVERITY: How bad is the cough today?      Moderate.  3. SPUTUM: Describe the color of your sputum (none, dry cough; clear, white, yellow, green)     Dry cough  4. HEMOPTYSIS: Are you coughing up any blood? If so ask: How much? (flecks, streaks, tablespoons, etc.)     Denies  5. DIFFICULTY BREATHING: Are you having difficulty breathing? If Yes, ask: How bad is it? (e.g., mild, moderate, severe)    - MILD: No SOB at rest, mild SOB with walking, speaks normally in sentences, can lie down, no retractions, pulse < 100.    - MODERATE: SOB at rest, SOB with minimal exertion and prefers to sit, cannot lie down flat, speaks in phrases, mild retractions, audible wheezing, pulse 100-120.    - SEVERE: Very SOB at rest, speaks in single words, struggling to breathe, sitting hunched forward,  retractions, pulse > 120      Denies.  6. FEVER: Do you have a fever? If Yes, ask: What is your temperature, how was it measured, and when did it start?     Denies.  7. CARDIAC HISTORY: Do you have any history of heart disease? (e.g., heart attack, congestive heart failure)      Cardiac stent, high blood pressure, she states she thinks she has CHF  8. LUNG HISTORY: Do you have any history of lung disease?  (e.g., pulmonary embolus, asthma, emphysema)     Denies.  9. PE RISK FACTORS: Do you have a history of blood clots? (or: recent major surgery, recent prolonged travel, bedridden)     Denies.  10. OTHER SYMPTOMS: Do you have any other symptoms? (e.g., runny nose, wheezing, chest pain)       Upper right side rib pain x 3-4 weeks worse with coughing or movement; wheezing when lying down  11. TRAVEL: Have you traveled out of the country in the last month? (e.g., travel history, exposures)       Denies travel out of the country; states she has been to 2 funerals and people around her were sick.  Protocols used: Cough - Acute Non-Productive-A-AH, Cough - Chronic-A-AH

## 2023-05-15 NOTE — Assessment & Plan Note (Addendum)
 Chest CT - ?pleurisy Pt stopped smoking 2 weeks ago COPD/bronchitis/R ribs pain Chest CT Zpack Hycodan prn Medrol pack No evidence of DVT/PE RTC to see Dr Okey Dupre D dimer, CMET

## 2023-05-15 NOTE — Progress Notes (Addendum)
 Subjective:  Patient ID: Deborah Stewart, female    DOB: 05/05/1955  Age: 68 y.o. MRN: [id number]  CC: Cough (Rib pain upon coughing and bending)   HPI Deborah Stewart presents for cough x 2-3 weeks, R rib pain Pt went to UC, rib X ray was ok; Rx - Prednisone , MDI, Tessalon , Lidoderm . She was better, now is worse again No SOB Ex-smoker  Outpatient Medications Prior to Visit  Medication Sig Dispense Refill   albuterol  (VENTOLIN  HFA) 108 (90 Base) MCG/ACT inhaler Inhale 1-2 puffs into the lungs every 6 (six) hours as needed for wheezing or shortness of breath. 18 g 0   alendronate (FOSAMAX) 70 MG tablet Take 70 mg by mouth once a week.     atorvastatin  (LIPITOR ) 80 MG tablet Take 80 mg by mouth daily.     clopidogrel  (PLAVIX ) 75 MG tablet TAKE 1 TABLET BY MOUTH DAILY WITH BREAKFAST. 90 tablet 3   doxycycline  (VIBRAMYCIN ) 100 MG capsule Take 1 capsule by mouth 2 (two) times daily.     fluticasone  (FLONASE ) 50 MCG/ACT nasal spray Place 2 sprays into both nostrils daily. 16 g 12   hydrocortisone  2.5 % cream Apply topically 2 (two) times daily. 84 g 3   Hyoscyamine  Sulfate SL 0.125 MG SUBL Place 1-2 tablets under the tongue every 4 (four) hours as needed for cramping (stomach cramps).     ibuprofen  (ADVIL ) 200 MG tablet Take 200 mg by mouth every 6 (six) hours as needed.     levothyroxine  (SYNTHROID ) 100 MCG tablet Take 1 tablet (100 mcg total) by mouth daily before breakfast. 90 tablet 3   lidocaine  (LIDODERM ) 5 % Place 1 patch onto the skin daily. Remove & Discard patch within 12 hours or as directed by MD 30 patch 0   Na Sulfate-K Sulfate-Mg Sulf 17.5-3.13-1.6 GM/177ML SOLN See admin instructions.     nitroGLYCERIN  (NITROSTAT ) 0.4 MG SL tablet Place 1 tablet (0.4 mg total) under the tongue every 5 (five) minutes as needed for chest pain. 25 tablet 11   Turmeric Curcumin (TURMERIC COMPLEX/BLACK PEPPER) 08-998 MG CAPS Take 1 capsule by mouth in the morning and at bedtime.     varenicline   (CHANTIX ) 0.5 MG tablet Take 0.5 mg by mouth daily.     varenicline  (CHANTIX ) 1 MG tablet Take 1 mg by mouth 2 (two) times daily.     benzonatate  (TESSALON ) 100 MG capsule Take 1 capsule (100 mg total) by mouth every 8 (eight) hours. (Patient not taking: Reported on 05/15/2023) 21 capsule 0   No facility-administered medications prior to visit.    ROS: Review of Systems  Constitutional:  Negative for activity change, appetite change, chills, fatigue and unexpected weight change.  HENT:  Positive for congestion. Negative for mouth sores and sinus pressure.   Eyes:  Negative for visual disturbance.  Respiratory:  Positive for cough. Negative for chest tightness.   Cardiovascular:  Positive for chest pain.  Gastrointestinal:  Negative for abdominal pain and nausea.  Genitourinary:  Negative for difficulty urinating, frequency and vaginal pain.  Musculoskeletal:  Negative for back pain and gait problem.  Skin:  Negative for pallor and rash.  Neurological:  Negative for dizziness, tremors, weakness, numbness and headaches.  Psychiatric/Behavioral:  Negative for confusion and sleep disturbance.     Objective:  BP 130/80 (BP Location: Left Arm, Patient Position: Sitting, Cuff Size: Normal)   Pulse 70   Temp 98.3 F (36.8 C) (Oral)   Ht 5' 5 (1.651 m)  Wt 183 lb (83 kg)   SpO2 97%   BMI 30.45 kg/m   BP Readings from Last 3 Encounters:  05/15/23 130/80  05/01/23 (!) 183/96  04/29/23 (!) 153/88    Wt Readings from Last 3 Encounters:  05/15/23 183 lb (83 kg)  04/29/23 185 lb (83.9 kg)  04/05/23 183 lb (83 kg)    Physical Exam Constitutional:      General: She is not in acute distress.    Appearance: She is well-developed.  HENT:     Head: Normocephalic.     Right Ear: External ear normal.     Left Ear: External ear normal.     Nose: Nose normal.  Eyes:     General:        Right eye: No discharge.        Left eye: No discharge.     Conjunctiva/sclera: Conjunctivae  normal.     Pupils: Pupils are equal, round, and reactive to light.  Neck:     Thyroid : No thyromegaly.     Vascular: No JVD.     Trachea: No tracheal deviation.  Cardiovascular:     Rate and Rhythm: Normal rate and regular rhythm.     Heart sounds: Normal heart sounds.  Pulmonary:     Effort: No respiratory distress.     Breath sounds: No stridor. No wheezing.  Abdominal:     General: Bowel sounds are normal. There is no distension.     Palpations: Abdomen is soft. There is no mass.     Tenderness: There is no abdominal tenderness. There is no guarding or rebound.  Musculoskeletal:        General: No tenderness.     Cervical back: Normal range of motion and neck supple. No rigidity.  Lymphadenopathy:     Cervical: No cervical adenopathy.  Skin:    Findings: No erythema or rash.  Neurological:     Cranial Nerves: No cranial nerve deficit.     Motor: No abnormal muscle tone.     Coordination: Coordination normal.     Deep Tendon Reflexes: Reflexes normal.  Psychiatric:        Behavior: Behavior normal.        Thought Content: Thought content normal.        Judgment: Judgment normal.   R lat ribs - tender coughing  Lab Results  Component Value Date   WBC 9.7 04/10/2022   HGB 16.2 (H) 04/10/2022   HCT 48.2 (H) 04/10/2022   PLT 326.0 04/10/2022   GLUCOSE 84 04/10/2022   CHOL 140 04/10/2022   TRIG 117.0 04/10/2022   HDL 40.10 04/10/2022   LDLDIRECT 112.1 10/11/2012   LDLCALC 77 04/10/2022   ALT 15 04/10/2022   AST 19 04/10/2022   NA 141 04/10/2022   K 4.6 04/10/2022   CL 103 04/10/2022   CREATININE 0.68 04/10/2022   BUN 10 04/10/2022   CO2 29 04/10/2022   TSH 0.70 04/10/2022   INR 1.1 02/01/2017   HGBA1C 6.4 04/10/2022    No results found.  Assessment & Plan:   Problem List Items Addressed This Visit     RESOLVED: Tobacco abuse   Pt stopped smoking 2 weeks ago      Chest pain on breathing   Chest CT - ?pleurisy Pt stopped smoking 2 weeks  ago COPD/bronchitis/R ribs pain Chest CT Zpack Hycodan prn Medrol  pack No evidence of DVT/PE RTC to see Dr Rollene D dimer, CMET  Relevant Orders   D-dimer, quantitative   CT Chest Wo Contrast   Comprehensive metabolic panel   Abnormal findings on diagnostic imaging of lung - Primary   Relevant Orders   CT Chest Wo Contrast   Subacute cough   COPD/bronchitis/R ribs pain Chest CT Zpack Hycodan prn Medrol  pack No evidence of DVT/PE RTC to see Dr Rollene      Relevant Orders   D-dimer, quantitative   CT Chest Wo Contrast      Meds ordered this encounter  Medications   azithromycin  (ZITHROMAX  Z-PAK) 250 MG tablet    Sig: As directed    Dispense:  6 tablet    Refill:  0   methylPREDNISolone  (MEDROL  DOSEPAK) 4 MG TBPK tablet    Sig: As directed    Dispense:  21 tablet    Refill:  0   HYDROcodone  bit-homatropine (HYCODAN) 5-1.5 MG/5ML syrup    Sig: Take 5 mLs by mouth every 8 (eight) hours as needed for cough.    Dispense:  240 mL    Refill:  0      Follow-up: Return in about 4 weeks (around 06/12/2023) for f/u with PCP.  Marolyn Noel, MD

## 2023-05-15 NOTE — Assessment & Plan Note (Signed)
 COPD/bronchitis/R ribs pain Chest CT Zpack Hycodan prn Medrol pack No evidence of DVT/PE RTC to see Dr Okey Dupre

## 2023-05-15 NOTE — Assessment & Plan Note (Signed)
 Pt stopped smoking 2 weeks ago

## 2023-05-16 DIAGNOSIS — M79672 Pain in left foot: Secondary | ICD-10-CM | POA: Diagnosis not present

## 2023-05-16 DIAGNOSIS — L89892 Pressure ulcer of other site, stage 2: Secondary | ICD-10-CM | POA: Diagnosis not present

## 2023-05-16 DIAGNOSIS — M79671 Pain in right foot: Secondary | ICD-10-CM | POA: Diagnosis not present

## 2023-05-16 LAB — D-DIMER, QUANTITATIVE: D-Dimer, Quant: 0.57 ug{FEU}/mL — ABNORMAL HIGH (ref <0.50)

## 2023-06-11 DIAGNOSIS — L602 Onychogryphosis: Secondary | ICD-10-CM | POA: Diagnosis not present

## 2023-06-11 DIAGNOSIS — L89892 Pressure ulcer of other site, stage 2: Secondary | ICD-10-CM | POA: Diagnosis not present

## 2023-06-11 DIAGNOSIS — M79671 Pain in right foot: Secondary | ICD-10-CM | POA: Diagnosis not present

## 2023-06-11 DIAGNOSIS — M79672 Pain in left foot: Secondary | ICD-10-CM | POA: Diagnosis not present

## 2023-06-12 ENCOUNTER — Other Ambulatory Visit: Payer: Self-pay | Admitting: Internal Medicine

## 2023-06-12 ENCOUNTER — Encounter: Payer: Self-pay | Admitting: Internal Medicine

## 2023-06-12 ENCOUNTER — Ambulatory Visit: Payer: Medicare Other | Admitting: Internal Medicine

## 2023-06-12 VITALS — BP 120/80 | HR 59 | Temp 98.0°F | Ht 65.0 in | Wt 189.0 lb

## 2023-06-12 DIAGNOSIS — R071 Chest pain on breathing: Secondary | ICD-10-CM

## 2023-06-12 DIAGNOSIS — R2231 Localized swelling, mass and lump, right upper limb: Secondary | ICD-10-CM

## 2023-06-12 DIAGNOSIS — R7303 Prediabetes: Secondary | ICD-10-CM

## 2023-06-12 DIAGNOSIS — K5190 Ulcerative colitis, unspecified, without complications: Secondary | ICD-10-CM | POA: Diagnosis not present

## 2023-06-12 DIAGNOSIS — R911 Solitary pulmonary nodule: Secondary | ICD-10-CM

## 2023-06-12 DIAGNOSIS — I2510 Atherosclerotic heart disease of native coronary artery without angina pectoris: Secondary | ICD-10-CM | POA: Diagnosis not present

## 2023-06-12 HISTORY — DX: Localized swelling, mass and lump, right upper limb: R22.31

## 2023-06-12 NOTE — Assessment & Plan Note (Signed)
Recent CT chest reviewed with her and persistent COPD findings. The nodule is stable at 2 mm and does not need further follow up.

## 2023-06-12 NOTE — Assessment & Plan Note (Signed)
She is taking lipitor 80 mg daily and persistent calcifications in coronary arteries on CT scan. No clinical symptoms of angina.

## 2023-06-12 NOTE — Assessment & Plan Note (Signed)
No flare recently and is not on controlling medication currently. Using diet and turmeric to help.

## 2023-06-12 NOTE — Assessment & Plan Note (Signed)
Reminded to please come get labs soon for follow up on this. She has had 3 recent deaths in the family and was unable to get these labs and is concerned that her diet was worse than usual last month and would like time to get back on track.

## 2023-06-12 NOTE — Assessment & Plan Note (Signed)
This is resolving and some soreness right chest wall still. 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

## 2023-06-12 NOTE — Assessment & Plan Note (Signed)
Ordered US axillary region to assess although I suspect reactive LAD. She has some tenderness and diffuse fullness.

## 2023-06-12 NOTE — Patient Instructions (Signed)
We will check the ultrasound of the armpit region on the right.

## 2023-06-12 NOTE — Progress Notes (Signed)
   Subjective:   Patient ID: Deborah Stewart, female    DOB: July 10, 1955, 68 y.o.   MRN: [id number]  HPI The patient is a 68 YO female coming in for follow up CT scan. Had this done after abnormal x-ray with urgent care. This showed no abnormality which required follow up. She was given antibiotics and is feeling better but still sore on the ribs right under arm. Also having new swelling under right armpit and this was sore initially now shrinking some but still fullness and tender.   Review of Systems  Constitutional: Negative.   HENT: Negative.    Eyes: Negative.   Respiratory:  Positive for cough. Negative for chest tightness and shortness of breath.   Cardiovascular:  Negative for chest pain, palpitations and leg swelling.  Gastrointestinal:  Negative for abdominal distention, abdominal pain, constipation, diarrhea, nausea and vomiting.  Musculoskeletal:  Positive for myalgias.  Skin: Negative.   Neurological: Negative.   Psychiatric/Behavioral: Negative.      Objective:  Physical Exam Constitutional:      Appearance: She is well-developed.  HENT:     Head: Normocephalic and atraumatic.  Cardiovascular:     Rate and Rhythm: Normal rate and regular rhythm.  Pulmonary:     Effort: Pulmonary effort is normal. No respiratory distress.     Breath sounds: Normal breath sounds. No wheezing or rales.     Comments: Pain right chest wall and fullness right axillary without discrete lesion or mass Chest:     Chest wall: Tenderness present.  Abdominal:     General: Bowel sounds are normal. There is no distension.     Palpations: Abdomen is soft.     Tenderness: There is no abdominal tenderness. There is no rebound.  Musculoskeletal:     Cervical back: Normal range of motion.  Skin:    General: Skin is warm and dry.  Neurological:     Mental Status: She is alert and oriented to person, place, and time.     Coordination: Coordination normal.     Vitals:   06/12/23 1006  BP:  120/80  Pulse: (!) 59  Temp: 98 F (36.7 C)  TempSrc: Oral  SpO2: 99%  Weight: 189 lb (85.7 kg)  Height: 5\' 5"  (1.651 m)    Assessment & Plan:

## 2023-06-25 ENCOUNTER — Ambulatory Visit
Admission: RE | Admit: 2023-06-25 | Discharge: 2023-06-25 | Disposition: A | Discharge status: Home / Self Care | Payer: Medicare Other | Source: Ambulatory Visit | Attending: Internal Medicine | Admitting: Internal Medicine

## 2023-06-25 DIAGNOSIS — R2231 Localized swelling, mass and lump, right upper limb: Secondary | ICD-10-CM

## 2023-06-25 DIAGNOSIS — N644 Mastodynia: Secondary | ICD-10-CM | POA: Diagnosis not present

## 2023-07-19 DIAGNOSIS — L89892 Pressure ulcer of other site, stage 2: Secondary | ICD-10-CM | POA: Diagnosis not present

## 2023-08-08 DIAGNOSIS — M79672 Pain in left foot: Secondary | ICD-10-CM | POA: Diagnosis not present

## 2023-08-08 DIAGNOSIS — L89892 Pressure ulcer of other site, stage 2: Secondary | ICD-10-CM | POA: Diagnosis not present

## 2023-08-08 DIAGNOSIS — M79671 Pain in right foot: Secondary | ICD-10-CM | POA: Diagnosis not present

## 2023-08-12 ENCOUNTER — Other Ambulatory Visit: Payer: Self-pay | Admitting: Internal Medicine

## 2023-08-29 DIAGNOSIS — L89893 Pressure ulcer of other site, stage 3: Secondary | ICD-10-CM | POA: Diagnosis not present

## 2023-08-29 DIAGNOSIS — L602 Onychogryphosis: Secondary | ICD-10-CM | POA: Diagnosis not present

## 2023-08-29 DIAGNOSIS — M79671 Pain in right foot: Secondary | ICD-10-CM | POA: Diagnosis not present

## 2023-08-29 DIAGNOSIS — M79672 Pain in left foot: Secondary | ICD-10-CM | POA: Diagnosis not present

## 2023-09-13 ENCOUNTER — Other Ambulatory Visit: Payer: Self-pay | Admitting: Cardiology

## 2023-09-13 ENCOUNTER — Other Ambulatory Visit: Payer: Self-pay | Admitting: Internal Medicine

## 2023-09-13 NOTE — Telephone Encounter (Signed)
 Prescription sent to pharmacy.

## 2023-09-19 DIAGNOSIS — L89892 Pressure ulcer of other site, stage 2: Secondary | ICD-10-CM | POA: Diagnosis not present

## 2023-10-17 DIAGNOSIS — M79671 Pain in right foot: Secondary | ICD-10-CM | POA: Diagnosis not present

## 2023-10-17 DIAGNOSIS — L89892 Pressure ulcer of other site, stage 2: Secondary | ICD-10-CM | POA: Diagnosis not present

## 2023-10-17 DIAGNOSIS — M79672 Pain in left foot: Secondary | ICD-10-CM | POA: Diagnosis not present

## 2023-10-17 DIAGNOSIS — L602 Onychogryphosis: Secondary | ICD-10-CM | POA: Diagnosis not present

## 2023-11-07 DIAGNOSIS — L89892 Pressure ulcer of other site, stage 2: Secondary | ICD-10-CM | POA: Diagnosis not present

## 2023-12-05 ENCOUNTER — Other Ambulatory Visit: Payer: Self-pay | Admitting: Internal Medicine

## 2023-12-05 DIAGNOSIS — M79671 Pain in right foot: Secondary | ICD-10-CM | POA: Diagnosis not present

## 2023-12-05 DIAGNOSIS — Z1231 Encounter for screening mammogram for malignant neoplasm of breast: Secondary | ICD-10-CM

## 2023-12-05 DIAGNOSIS — L89892 Pressure ulcer of other site, stage 2: Secondary | ICD-10-CM | POA: Diagnosis not present

## 2023-12-23 ENCOUNTER — Other Ambulatory Visit: Payer: Self-pay | Admitting: Cardiology

## 2023-12-25 ENCOUNTER — Other Ambulatory Visit: Payer: Self-pay | Admitting: Cardiology

## 2023-12-25 DIAGNOSIS — I2510 Atherosclerotic heart disease of native coronary artery without angina pectoris: Secondary | ICD-10-CM

## 2023-12-26 DIAGNOSIS — L84 Corns and callosities: Secondary | ICD-10-CM | POA: Diagnosis not present

## 2023-12-26 DIAGNOSIS — M79671 Pain in right foot: Secondary | ICD-10-CM | POA: Diagnosis not present

## 2023-12-26 DIAGNOSIS — L89892 Pressure ulcer of other site, stage 2: Secondary | ICD-10-CM | POA: Diagnosis not present

## 2023-12-28 ENCOUNTER — Encounter: Payer: Self-pay | Admitting: Pharmacist

## 2023-12-28 ENCOUNTER — Ambulatory Visit
Admission: RE | Admit: 2023-12-28 | Discharge: 2023-12-28 | Disposition: A | Discharge status: Home / Self Care | Source: Ambulatory Visit | Attending: Internal Medicine | Admitting: Internal Medicine

## 2023-12-28 DIAGNOSIS — Z1231 Encounter for screening mammogram for malignant neoplasm of breast: Secondary | ICD-10-CM

## 2023-12-28 NOTE — Progress Notes (Signed)
 Pharmacy Quality Measure Review   This patient is appearing on a report for being at risk of failing the adherence measure for cholesterol (statin) medications this calendar year.   Medication: Atorvastatin  80mg   Last fill date: 12/25/2023 for 90 day supply   Insurance report was not up to date. No action needed at this time.   Lum Ricks, PharmD Candidate  [facility] [employer]   Darrelyn Drum, PharmD, [employer], CPP Clinical Pharmacist Practitioner [facility] [phone]

## 2024-01-04 ENCOUNTER — Ambulatory Visit: Payer: Self-pay | Admitting: Internal Medicine

## 2024-01-04 LAB — HM MAMMOGRAPHY

## 2024-01-16 DIAGNOSIS — M79671 Pain in right foot: Secondary | ICD-10-CM | POA: Diagnosis not present

## 2024-01-16 DIAGNOSIS — L89892 Pressure ulcer of other site, stage 2: Secondary | ICD-10-CM | POA: Diagnosis not present

## 2024-01-16 DIAGNOSIS — L84 Corns and callosities: Secondary | ICD-10-CM | POA: Diagnosis not present

## 2024-01-22 ENCOUNTER — Ambulatory Visit: Admitting: Cardiology

## 2024-01-22 ENCOUNTER — Other Ambulatory Visit: Payer: Self-pay | Admitting: Cardiology

## 2024-01-22 DIAGNOSIS — I2510 Atherosclerotic heart disease of native coronary artery without angina pectoris: Secondary | ICD-10-CM

## 2024-02-04 ENCOUNTER — Other Ambulatory Visit

## 2024-02-04 DIAGNOSIS — I10 Essential (primary) hypertension: Secondary | ICD-10-CM

## 2024-02-04 DIAGNOSIS — R7303 Prediabetes: Secondary | ICD-10-CM

## 2024-02-04 DIAGNOSIS — E039 Hypothyroidism, unspecified: Secondary | ICD-10-CM

## 2024-02-04 DIAGNOSIS — E559 Vitamin D deficiency, unspecified: Secondary | ICD-10-CM | POA: Diagnosis not present

## 2024-02-04 LAB — LIPID PANEL
Cholesterol: 143 mg/dL (ref 0–200)
HDL: 39 mg/dL — ABNORMAL LOW (ref >39.00)
LDL Cholesterol: 68 mg/dL (ref 0–99)
NonHDL: 103.54
Total CHOL/HDL Ratio: 4
Triglycerides: 177 mg/dL — ABNORMAL HIGH (ref 0.0–149.0)
VLDL: 35.4 mg/dL (ref 0.0–40.0)

## 2024-02-04 LAB — COMPREHENSIVE METABOLIC PANEL WITH GFR
ALT: 16 U/L (ref 0–35)
AST: 19 U/L (ref 0–37)
Albumin: 4.1 g/dL (ref 3.5–5.2)
Alkaline Phosphatase: 71 U/L (ref 39–117)
BUN: 13 mg/dL (ref 6–23)
CO2: 24 meq/L (ref 19–32)
Calcium: 9.5 mg/dL (ref 8.4–10.5)
Chloride: 104 meq/L (ref 96–112)
Creatinine, Ser: 0.68 mg/dL (ref 0.40–1.20)
GFR: 89.74 mL/min (ref >60.00)
Glucose, Bld: 89 mg/dL (ref 70–99)
Potassium: 3.9 meq/L (ref 3.5–5.1)
Sodium: 141 meq/L (ref 135–145)
Total Bilirubin: 0.4 mg/dL (ref 0.2–1.2)
Total Protein: 7.1 g/dL (ref 6.0–8.3)

## 2024-02-04 LAB — VITAMIN D 25 HYDROXY (VIT D DEFICIENCY, FRACTURES): VITD: 14.54 ng/mL — ABNORMAL LOW (ref 30.00–100.00)

## 2024-02-04 LAB — CBC
HCT: 44.1 % (ref 36.0–46.0)
Hemoglobin: 14.6 g/dL (ref 12.0–15.0)
MCHC: 33.1 g/dL (ref 30.0–36.0)
MCV: 89.8 fl (ref 78.0–100.0)
Platelets: 288 K/uL (ref 150.0–400.0)
RBC: 4.91 Mil/uL (ref 3.87–5.11)
RDW: 13.9 % (ref 11.5–15.5)
WBC: 6.9 K/uL (ref 4.0–10.5)

## 2024-02-04 LAB — HEMOGLOBIN A1C: Hgb A1c MFr Bld: 6.4 % (ref 4.6–6.5)

## 2024-02-04 LAB — TSH: TSH: 0.13 u[IU]/mL — ABNORMAL LOW (ref 0.35–5.50)

## 2024-02-06 DIAGNOSIS — L89892 Pressure ulcer of other site, stage 2: Secondary | ICD-10-CM | POA: Diagnosis not present

## 2024-02-06 DIAGNOSIS — L84 Corns and callosities: Secondary | ICD-10-CM | POA: Diagnosis not present

## 2024-02-06 DIAGNOSIS — M79671 Pain in right foot: Secondary | ICD-10-CM | POA: Diagnosis not present

## 2024-02-06 DIAGNOSIS — M79672 Pain in left foot: Secondary | ICD-10-CM | POA: Diagnosis not present

## 2024-02-11 ENCOUNTER — Ambulatory Visit: Payer: Self-pay | Admitting: Internal Medicine

## 2024-02-11 MED ORDER — VITAMIN D (ERGOCALCIFEROL) 1.25 MG (50000 UNIT) PO CAPS: 50000.0000 [IU] | ORAL_CAPSULE | ORAL | 0 refills | Status: DC

## 2024-02-27 DIAGNOSIS — M79672 Pain in left foot: Secondary | ICD-10-CM | POA: Diagnosis not present

## 2024-02-27 DIAGNOSIS — L84 Corns and callosities: Secondary | ICD-10-CM | POA: Diagnosis not present

## 2024-02-27 DIAGNOSIS — M79671 Pain in right foot: Secondary | ICD-10-CM | POA: Diagnosis not present

## 2024-02-27 DIAGNOSIS — L89892 Pressure ulcer of other site, stage 2: Secondary | ICD-10-CM | POA: Diagnosis not present

## 2024-02-28 ENCOUNTER — Ambulatory Visit

## 2024-03-04 ENCOUNTER — Ambulatory Visit

## 2024-03-04 ENCOUNTER — Ambulatory Visit: Attending: Cardiology | Admitting: Cardiology

## 2024-03-04 ENCOUNTER — Ambulatory Visit (INDEPENDENT_AMBULATORY_CARE_PROVIDER_SITE_OTHER): Admitting: Emergency Medicine

## 2024-03-04 ENCOUNTER — Encounter: Payer: Self-pay | Admitting: Cardiology

## 2024-03-04 ENCOUNTER — Encounter: Payer: Self-pay | Admitting: Emergency Medicine

## 2024-03-04 VITALS — BP 118/70 | HR 62 | Ht 65.0 in | Wt 190.1 lb

## 2024-03-04 VITALS — BP 112/62 | HR 60 | Temp 98.0°F | Ht 65.0 in | Wt 189.0 lb

## 2024-03-04 VITALS — BP 112/62 | HR 60 | Ht 65.0 in | Wt 189.0 lb

## 2024-03-04 DIAGNOSIS — I2510 Atherosclerotic heart disease of native coronary artery without angina pectoris: Secondary | ICD-10-CM

## 2024-03-04 DIAGNOSIS — Z0000 Encounter for general adult medical examination without abnormal findings: Secondary | ICD-10-CM | POA: Diagnosis not present

## 2024-03-04 DIAGNOSIS — G5603 Carpal tunnel syndrome, bilateral upper limbs: Secondary | ICD-10-CM | POA: Diagnosis not present

## 2024-03-04 DIAGNOSIS — E66811 Obesity, class 1: Secondary | ICD-10-CM | POA: Diagnosis not present

## 2024-03-04 DIAGNOSIS — E782 Mixed hyperlipidemia: Secondary | ICD-10-CM | POA: Diagnosis not present

## 2024-03-04 DIAGNOSIS — I10 Essential (primary) hypertension: Secondary | ICD-10-CM

## 2024-03-04 DIAGNOSIS — R202 Paresthesia of skin: Secondary | ICD-10-CM | POA: Diagnosis not present

## 2024-03-04 NOTE — Patient Instructions (Signed)
 Carpal Tunnel Syndrome Information, Adult In this video, you'll learn about the causes, symptoms, and treatment options for carpal tunnel syndrome in adults. To view the content, go to this web address: [email or web address]  This video will expire on: 04/11/2025. If you need access to this video following this date, please reach out to the healthcare provider who assigned it to you. This information is not intended to replace advice given to you by your health care provider. Make sure you discuss any questions you have with your health care provider. Elsevier Patient Education  2024 [employer].

## 2024-03-04 NOTE — Patient Instructions (Signed)

## 2024-03-04 NOTE — Assessment & Plan Note (Signed)
 Chronic problem affecting quality of life. Unremarkable physical examination.  No red flag signs or symptoms History of prediabetes and hypertension. No longer smoking. Differential diagnosis discussed including possibility of carpal tunnel syndrome Recommend orthopedic evaluation for this condition in particular. Referral placed today.

## 2024-03-04 NOTE — Progress Notes (Signed)
 Deborah Stewart 68 y.o.   Chief Complaint  Patient presents with   Follow-up    Pt would like a referral to neurology  because she is having tingling in her arms / pt use to go to [facility]     HISTORY OF PRESENT ILLNESS: Acute problem visit today. This is a 68 y.o. female complaining of intermittent numbness and tingling to both hands for the past 8 to 9 years.  Initially mostly right hand but now both hands About 3 years ago was seen by neurologist.  Had nerve conduction testing done.  Inconclusive. No other associated symptoms.  History of prediabetes. No other complaints or medical concerns today.  HPI   Prior to Admission medications   Medication Sig Start Date End Date Taking? Authorizing Provider  albuterol  (VENTOLIN  HFA) 108 (90 Base) MCG/ACT inhaler Inhale 1-2 puffs into the lungs every 6 (six) hours as needed for wheezing or shortness of breath. 04/29/23   Raspet, Erin K, PA-C  alendronate (FOSAMAX) 70 MG tablet Take 70 mg by mouth once a week. 12/09/20   [provider]  atorvastatin  (LIPITOR ) 80 MG tablet TAKE 1 TABLET BY MOUTH DAILY. PATIENT NEEDS TO MAKE AN APPOINTMENT FOR FURTHER REFILLS. 1ST ATTEMPT. 12/25/23   Revankar, Jennifer SAUNDERS, MD  clopidogrel  (PLAVIX ) 75 MG tablet TAKE 1 TABLET BY MOUTH EVERY DAY WITH BREAKFAST 01/22/24   Revankar, Rajan R, MD  fluticasone  (FLONASE ) 50 MCG/ACT nasal spray Place 2 sprays into both nostrils daily. 08/28/21   Christopher Savannah, PA-C  hydrocortisone  2.5 % cream Apply topically 2 (two) times daily. 04/05/23   Rollene Almarie LABOR, MD  Hyoscyamine  Sulfate SL 0.125 MG SUBL Place 1-2 tablets under the tongue every 4 (four) hours as needed for cramping (stomach cramps).    [provider]  ibuprofen  (ADVIL ) 200 MG tablet Take 200 mg by mouth every 6 (six) hours as needed.    [provider]  levothyroxine  (SYNTHROID ) 100 MCG tablet Take 1 tablet (100 mcg total) by mouth daily before breakfast. 04/05/23    Rollene Almarie LABOR, MD  lidocaine  (LIDODERM ) 5 % Place 1 patch onto the skin daily. Remove & Discard patch within 12 hours or as directed by MD 04/29/23   Raspet, Rocky POUR, PA-C  methylPREDNISolone  (MEDROL  DOSEPAK) 4 MG TBPK tablet As directed Patient not taking: Reported on 03/04/2024 05/15/23   Plotnikov, Karlynn GAILS, MD  Na Sulfate-K Sulfate-Mg Sulf 17.5-3.13-1.6 GM/177ML SOLN See admin instructions.    [provider]  nitroGLYCERIN  (NITROSTAT ) 0.4 MG SL tablet Place 1 tablet (0.4 mg total) under the tongue every 5 (five) minutes as needed for chest pain. 09/05/22   Revankar, Rajan R, MD  Turmeric Curcumin (TURMERIC COMPLEX/BLACK PEPPER) 08-998 MG CAPS Take 1 capsule by mouth in the morning and at bedtime.    [provider]  varenicline  (CHANTIX ) 0.5 MG tablet Take 0.5 mg by mouth daily.    [provider]  varenicline  (CHANTIX ) 1 MG tablet TAKE 1 TABLET BY MOUTH TWICE A DAY 09/13/23   Rollene Almarie LABOR, MD  Vitamin D , Ergocalciferol , (DRISDOL ) 1.25 MG (50000 UNIT) CAPS capsule Take 1 capsule (50,000 Units total) by mouth every 7 (seven) days. 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   Vitamin D  deficiency 08/30/2016   Osteoporosis 08/28/2016   Chronic left shoulder pain 07/15/2015   Chronic pain of left knee 07/15/2015   Snoring 01/14/2013   Health care maintenance 07/08/2012    Chronic back pain 07/08/2012   Chest pain on breathing 09/20/2010   History of colonic polyps 06/2010   Hypothyroidism 11/10/2008    Past Medical History:  Diagnosis Date   Chest pain on breathing 09/20/2010   Chest CT - ?pleurisy  Pt stopped smoking 2 weeks ago  COPD/bronchitis/R ribs pain  Chest CT  Zpack  Hycodan prn  Medrol  pack  No evidence of DVT/PE  RTC to see Dr Rollene     Chronic back pain 07/08/2012   Pt has had lower back pain from a herniated disc for years, she is s/p cervical diskectomy.    Chronic left shoulder pain 07/15/2015   Chronic pain of left knee 07/15/2015   Coronary artery disease    10/18 PCI/DESx1 to mRCA.    DDD (degenerative disc disease), cervical    s/p diskectomy    Health care maintenance 07/08/2012   Pt is being followed by a gastroenterologist for her ulcerative colitis they manage her colonoscopies.  Pt has an appointment with her OB/Gyn in July.    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   History of colonic polyps 06/2010   hyperplastic, tubular adenoma  IMO SNOMED Dx Update Oct 2024     Hyperlipidemia    Hypertension    Hypothyroidism 11/10/2008   Qualifier: Diagnosis of  By: Jakie MD NOLIA Lenis R  Chronic problem s/p partial thyroidectomy     Lesion of vulva 09/25/2017   Numbness of fingers 05/21/2020   Obesity (BMI 30.0-34.9) 07/13/2020   Osteoporosis 08/28/2016   Pre-diabetes 11/04/2018   Right axillary fullness 06/12/2023   Right foot pain 11/18/2018   Snoring 01/14/2013   IMO SNOMED Dx Update Oct 2024     Solitary pulmonary nodule 04/10/2022   Subacute cough 05/15/2023   COPD/bronchitis/R ribs pain  Chest CT  Zpack  Hycodan prn  Medrol  pack  No evidence of DVT/PE  RTC to see Dr Rollene     Thyroid  nodule    Ulcerative colitis (HCC)    Vitamin D  deficiency 08/30/2016    Past Surgical History:  Procedure Laterality Date   ANTERIOR CERVICAL DECOMP/DISCECTOMY FUSION  2004   Dr. Malcolm SANES SURGERY     BREAST BIOPSY  Bilateral    COLONOSCOPY     CORONARY ANGIOPLASTY WITH STENT PLACEMENT  02/06/2017   CORONARY STENT INTERVENTION N/A 02/06/2017   Procedure: CORONARY STENT INTERVENTION;  Surgeon: Claudene Victory ORN, MD;  Location: [facility] INVASIVE CV LAB;  Service: Cardiovascular;  Laterality: N/A;   FOOT SURGERY Right 09/2011    Dr. Charlie Blowers, DPO, removal of mass    FOOT SURGERY Left 1970s X 1   FOOT SURGERY Right 2000s multiple   botched 1st time; tried to correct several times   LEFT HEART CATH AND CORONARY ANGIOGRAPHY N/A 02/06/2017   Procedure: LEFT HEART CATH AND CORONARY  ANGIOGRAPHY;  Surgeon: Claudene Victory ORN, MD;  Location: [facility] INVASIVE CV LAB;  Service: Cardiovascular;  Laterality: N/A;   THYROIDECTOMY, PARTIAL  06/2009   removed goiter   TOTAL ABDOMINAL HYSTERECTOMY     TOTAL ABDOMINAL HYSTERECTOMY W/ BILATERAL SALPINGOOPHORECTOMY     Social History   Socioeconomic History   Marital status: Divorced    Spouse name: Not on file   Number of children: 1   Years of education: 12   Highest education level: Not on file  Occupational History   Occupation: filed Catering Manager: [employer]  Tobacco Use   Smoking status: Former    Current packs/day: 1.00    Average packs/day: 0.5 packs/day for 49.8 years (27.3 ttl pk-yrs)    Types: Cigarettes    Start date: 12/31/2018   Smokeless tobacco: Never   Tobacco comments:    taking Chantix   Vaping Use   Vaping status: Never Used  Substance and Sexual Activity   Alcohol use: Yes    Alcohol/week: 7.0 standard drinks of alcohol    Types: 1 Glasses of wine, 1 Shots of liquor, 5 Standard drinks or equivalent per week    Comment: occ   Drug use: Yes    Types: Marijuana    Comment: socially   Sexual activity: Yes    Partners: Male  Other Topics Concern   Not on file  Social History Narrative   [employer], [employer] -BS social work. Married 1980 - 16 yrs/divorced. 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 Lives alone. Dating - long  term relationship/[place]. Unprotected. No history of physical or sexual abuse.    Social Drivers of Corporate Investment Banker Strain: Not on file  Food Insecurity: No Food Insecurity (03/04/2024)   Hunger Vital Sign    Worried About Running Out of Food in the Last Year: Never true    Ran Out of Food in the Last Year: Never true  Transportation Needs: No Transportation Needs (03/04/2024)   PRAPARE - Administrator, Civil Service (Medical): No    Lack of Transportation (Non-Medical): No  Physical Activity: Sufficiently Active (03/04/2024)   Exercise Vital Sign    Days of Exercise per Week: 5 days    Minutes of Exercise per Session: 30 min  Stress: No Stress Concern Present (03/04/2024)   [employer] of Occupational Health - Occupational Stress Questionnaire    Feeling of Stress: Not at all  Social Connections: Moderately Integrated (03/04/2024)   Social Connection and Isolation Panel    Frequency of Communication with Friends and Family: More than three times a week    Frequency of Social Gatherings with Friends and Family: More than three times a week    Attends Religious Services: More than 4 times per year    Active Member of [employer] or Organizations: No    Attends Banker Meetings: 1 to 4 times per year    Marital Status: Divorced  Catering Manager Violence: Not At Risk (03/04/2024)   Humiliation, Afraid, Rape, and Kick questionnaire    Fear of Current or Ex-Partner: No    Emotionally Abused: No    Physically Abused: No    Sexually Abused: No    Family History  Problem Relation Age of Onset   Diabetes Mother    Diabetes Father    Heart disease Father        CHF   Breast cancer Sister    Diabetes Brother  Heart disease Brother    Hypertension Brother    Hyperlipidemia Brother    Hypertension Brother    Lung cancer Other        uncle   Breast cancer Other        great aunt   Diabetes Other        grandparents   Heart disease Other         grandfather   Colon cancer Neg Hx    Stomach cancer Neg Hx    Rectal cancer Neg Hx    Esophageal cancer Neg Hx      Review of Systems  Constitutional: Negative.  Negative for chills and fever.  HENT: Negative.  Negative for congestion and sore throat.   Respiratory: Negative.  Negative for cough and shortness of breath.   Cardiovascular: Negative.  Negative for chest pain and palpitations.  Gastrointestinal:  Negative for abdominal pain, diarrhea, nausea and vomiting.  Genitourinary: Negative.  Negative for dysuria and hematuria.  Skin: Negative.   Neurological:  Positive for sensory change. Negative for tremors and focal weakness.  All other systems reviewed and are negative.   Vitals:   03/04/24 0939  BP: 112/62  Pulse: 60  Temp: 98 F (36.7 C)    Physical Exam Vitals reviewed.  Constitutional:      Appearance: Normal appearance.  HENT:     Head: Normocephalic.  Eyes:     Extraocular Movements: Extraocular movements intact.  Cardiovascular:     Rate and Rhythm: Normal rate and regular rhythm.     Pulses: Normal pulses.     Heart sounds: Normal heart sounds.  Pulmonary:     Effort: Pulmonary effort is normal.     Breath sounds: Normal breath sounds.  Musculoskeletal:     Cervical back: No tenderness.     Comments: Wrists and hands: Full range of motion.  No erythema or swelling.  No ecchymosis.  Good peripheral pulses and good capillary refill.  No abnormal findings.  Lymphadenopathy:     Cervical: No cervical adenopathy.  Skin:    General: Skin is warm and dry.     Capillary Refill: Capillary refill takes less than 2 seconds.  Neurological:     General: No focal deficit present.     Mental Status: She is alert and oriented to person, place, and time.  Psychiatric:        Mood and Affect: Mood normal.        Behavior: Behavior normal.      ASSESSMENT & PLAN: Problem List Items Addressed This Visit       Nervous and Auditory   Bilateral carpal  tunnel syndrome   Edited:Norins, Ozell E10/10/2012 4:46 AM   02/03/13: Ms. Dye presents with intermittent episodes of right hand tingling and numbness that can last for hours and are have no precipitating factors. Wearing a wrist brace seems to decrease the frequency of the episodes. Differential includes carpal tunnel syndrome vs. cervical spinal root stenosis. Tinel sign positive and phalen test negative on physical exam. History of cervical spinal stenosis and recent cervical spinal XR shows stenosis and bony changes. Given positive tinel sign and history of improvement with wrist brace, carpal tunnel syndrome is the most likely etiology.    Plan     Continue to wear wrist brace at night and during the day while driving.   Today's assessment very similar to above: Symptoms very suggestive for carpal tunnel syndrome Recommend orthopedic evaluation.  Referral placed today.  Relevant Orders   Ambulatory referral to Orthopedic Surgery     Other   Paresthesia of both hands - Primary   Chronic problem affecting quality of life. Unremarkable physical examination.  No red flag signs or symptoms History of prediabetes and hypertension. No longer smoking. Differential diagnosis discussed including possibility of carpal tunnel syndrome Recommend orthopedic evaluation for this condition in particular. Referral placed today.      Relevant Orders   Ambulatory referral to Orthopedic Surgery   Patient Instructions  Carpal Tunnel Syndrome Information, Adult In this video, you'll learn about the causes, symptoms, and treatment options for carpal tunnel syndrome in adults. To view the content, go to this web address: [email or web address]  This video will expire on: 04/11/2025. If you need access to this video following this date, please reach out to the healthcare provider who assigned it to you. This information is not intended to replace advice given to you by your health  care provider. Make sure you discuss any questions you have with your health care provider. Elsevier Patient Education  2024 Elsevier Inc.     Emil Schaumann, MD [facility]

## 2024-03-04 NOTE — Patient Instructions (Addendum)
 Ms. Deborah Stewart,  Thank you for taking the time for your Medicare Wellness Visit. I appreciate your continued commitment to your health goals. Please review the care plan we discussed, and feel free to reach out if I can assist you further.  Please note that Annual Wellness Visits do not include a physical exam. Some assessments may be limited, especially if the visit was conducted virtually. If needed, we may recommend an in-person follow-up with your provider.  Ongoing Care Seeing your primary care provider every 3 to 6 months helps us  monitor your health and provide consistent, personalized care.   Referrals If a referral was made during today's visit and you haven't received any updates within two weeks, please contact the referred provider directly to check on the status.  Recommended Screenings:  Health Maintenance  Topic Date Due   DTaP/Tdap/Td vaccine (2 - Td or Tdap) 07/09/2022   Zoster (Shingles) Vaccine (1 of 2) 06/04/2024*   Flu Shot  07/29/2024*   Screening for Lung Cancer  05/14/2024   Medicare Annual Wellness Visit  03/04/2025   Breast Cancer Screening  01/03/2026   Colon Cancer Screening  03/12/2026   Pneumococcal Vaccine for age over 65  Completed   DEXA scan (bone density measurement)  Completed   Hepatitis C Screening  Completed   Meningitis B Vaccine  Aged Out   COVID-19 Vaccine  Discontinued  *Topic was postponed. The date shown is not the original due date.       03/04/2024    8:00 AM  Advanced Directives  Does Patient Have a Medical Advance Directive? No  Would patient like information on creating a medical advance directive? No - Guardian declined    Vision: Annual vision screenings are recommended for early detection of glaucoma, cataracts, and diabetic retinopathy. These exams can also reveal signs of chronic conditions such as diabetes and high blood pressure.  Dental: Annual dental screenings help detect early signs of oral cancer, gum disease, and other  conditions linked to overall health, including heart disease and diabetes.

## 2024-03-04 NOTE — Assessment & Plan Note (Signed)
 Edited:Norins, Ozell E10/10/2012 4:46 AM   02/03/13: Ms. Simkin presents with intermittent episodes of right hand tingling and numbness that can last for hours and are have no precipitating factors. Wearing a wrist brace seems to decrease the frequency of the episodes. Differential includes carpal tunnel syndrome vs. cervical spinal root stenosis. Tinel sign positive and phalen test negative on physical exam. History of cervical spinal stenosis and recent cervical spinal XR shows stenosis and bony changes. Given positive tinel sign and history of improvement with wrist brace, carpal tunnel syndrome is the most likely etiology.    Plan     Continue to wear wrist brace at night and during the day while driving.   Today's assessment very similar to above: Symptoms very suggestive for carpal tunnel syndrome Recommend orthopedic evaluation.  Referral placed today.

## 2024-03-04 NOTE — Progress Notes (Signed)
 Subjective:   Deborah Stewart is a 68 y.o. female who presents for a Medicare Annual Wellness Visit.  Visit Complete: In-Person   Patient Location: In-Office  Provider Location: Office/Clinic   I discussed the limitations of evaluation and management by telemedicine. The patient expressed understanding and agreed to proceed.   Vital Signs: vitals are completed in office  Persons Participating in Visit: Patient.  Allergies (verified) Codeine   History: Past Medical History:  Diagnosis Date   Chest pain on breathing 09/20/2010   Chest CT - ?pleurisy  Pt stopped smoking 2 weeks ago  COPD/bronchitis/R ribs pain  Chest CT  Zpack  Hycodan prn  Medrol  pack  No evidence of DVT/PE  RTC to see Dr Rollene     Chronic back pain 07/08/2012   Pt has had lower back pain from a herniated disc for years, she is s/p cervical diskectomy.    Chronic left shoulder pain 07/15/2015   Chronic pain of left knee 07/15/2015   Coronary artery disease    10/18 PCI/DESx1 to mRCA.    DDD (degenerative disc disease), cervical    s/p diskectomy    Health care maintenance 07/08/2012   Pt is being followed by a gastroenterologist for her ulcerative colitis they manage her colonoscopies.  Pt has an appointment with her OB/Gyn in July.    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   History of colonic polyps 06/2010   hyperplastic, tubular adenoma  IMO SNOMED Dx Update Oct 2024     Hyperlipidemia    Hypertension    Hypothyroidism 11/10/2008   Qualifier: Diagnosis of  By: Jakie MD NOLIA Lenis R  Chronic problem s/p partial thyroidectomy     Lesion of vulva 09/25/2017   Numbness of fingers 05/21/2020   Obesity (BMI 30.0-34.9) 07/13/2020   Osteoporosis 08/28/2016   Pre-diabetes 11/04/2018   Right axillary fullness 06/12/2023   Right foot pain 11/18/2018   Snoring 01/14/2013   IMO SNOMED Dx Update Oct 2024     Solitary pulmonary nodule 04/10/2022   Subacute cough 05/15/2023   COPD/bronchitis/R ribs  pain  Chest CT  Zpack  Hycodan prn  Medrol  pack  No evidence of DVT/PE  RTC to see Dr Rollene     Thyroid  nodule    Ulcerative colitis (HCC)    Vitamin D  deficiency 08/30/2016   Past Surgical History:  Procedure Laterality Date   ANTERIOR CERVICAL DECOMP/DISCECTOMY FUSION  2004   Dr. Malcolm SANES SURGERY     BREAST BIOPSY Bilateral    COLONOSCOPY     CORONARY ANGIOPLASTY WITH STENT PLACEMENT  02/06/2017   CORONARY STENT INTERVENTION N/A 02/06/2017   Procedure: CORONARY STENT INTERVENTION;  Surgeon: Claudene Victory ORN, MD;  Location: [facility];  Service: Cardiovascular;  Laterality: N/A;   FOOT SURGERY Right 09/2011    Dr. Charlie Blowers, DPO, removal of mass    FOOT SURGERY Left 1970s X 1   FOOT SURGERY Right 2000s multiple   botched 1st time; tried to correct several times   LEFT HEART CATH AND CORONARY ANGIOGRAPHY N/A 02/06/2017   Procedure: LEFT HEART CATH AND CORONARY ANGIOGRAPHY;  Surgeon: Claudene Victory ORN, MD;  Location: [facility];  Service: Cardiovascular;  Laterality: N/A;   THYROIDECTOMY, PARTIAL  06/2009   removed goiter   TOTAL ABDOMINAL HYSTERECTOMY     TOTAL ABDOMINAL HYSTERECTOMY W/ BILATERAL SALPINGOOPHORECTOMY    Family History  Problem Relation Age of Onset   Diabetes Mother  Diabetes Father    Heart disease Father        CHF   Breast cancer Sister    Diabetes Brother    Heart disease Brother    Hypertension Brother    Hyperlipidemia Brother    Hypertension Brother    Lung cancer Other        uncle   Breast cancer Other        great aunt   Diabetes Other        grandparents   Heart disease Other        grandfather   Colon cancer Neg Hx    Stomach cancer Neg Hx    Rectal cancer Neg Hx    Esophageal cancer Neg Hx    Social History   Occupational History   Occupation: filed Catering Manager: [employer]  Tobacco Use   Smoking status: Former    Current packs/day: 1.00    Average packs/day: 0.5 packs/day for 49.8 years  (27.3 ttl pk-yrs)    Types: Cigarettes    Start date: 12/31/2018   Smokeless tobacco: Never   Tobacco comments:    taking Chantix   Vaping Use   Vaping status: Never Used  Substance and Sexual Activity   Alcohol use: Yes    Alcohol/week: 7.0 standard drinks of alcohol    Types: 1 Glasses of wine, 1 Shots of liquor, 5 Standard drinks or equivalent per week    Comment: occ   Drug use: Yes    Types: Marijuana    Comment: socially   Sexual activity: Yes    Partners: Male   Tobacco Counseling Counseling given: Not Answered Tobacco comments: taking Chantix   SDOH Screenings   Food Insecurity: No Food Insecurity (03/04/2024)  Housing: Low Risk  (03/04/2024)  Transportation Needs: No Transportation Needs (03/04/2024)  Utilities: Not At Risk (03/04/2024)  Depression (PHQ2-9): Low Risk  (03/04/2024)  Physical Activity: Sufficiently Active (03/04/2024)  Social Connections: Moderately Integrated (03/04/2024)  Stress: No Stress Concern Present (03/04/2024)  Tobacco Use: Medium Risk (03/04/2024)  Health Literacy: Adequate Health Literacy (03/04/2024)   Depression Screen    03/04/2024    8:00 AM 04/05/2023    9:39 AM 04/10/2022    9:25 AM 11/24/2021   10:30 AM 05/20/2021    2:41 PM 05/20/2020    8:49 AM 11/04/2018   10:19 AM  PHQ 2/9 Scores  PHQ - 2 Score 0 0 0 0 0 0 1  PHQ- 9 Score 0 0 0 0        Goals Addressed               This Visit's Progress     Patient Stated (pt-stated)        Patient stated she plans to continue manage health       Visit info / Clinical Intake: Medicare Wellness Visit Type:: Subsequent Annual Wellness Visit Medicare Wellness Visit Mode:: In-person (required for WTM) Interpreter Needed?: No Pre-visit prep was completed: yes AWV questionnaire completed by patient prior to visit?: no Living arrangements:: (!) lives alone Patient's Overall Health Status Rating: good Typical amount of pain: none Does pain affect daily life?: no Are you currently prescribed  opioids?: no  Dietary Habits and Nutritional Risks How many meals a day?: 3 Eats fruit and vegetables daily?: yes Most meals are obtained by: preparing own meals In the last 2 weeks, have you had any of the following?: -- (none) Diabetic:: no  Functional Status Activities of Daily Living (to  include ambulation/medication): Independent Ambulation: Independent Medication Administration: Independent Home Management: Independent Manage your own finances?: yes Primary transportation is: driving Concerns about vision?: no *vision screening is required for WTM* Concerns about hearing?: no  Fall Screening Falls in the past year?: 0 Number of falls in past year: 0 Was there an injury with Fall?: 0 Fall Risk Category Calculator: 0 Patient Fall Risk Level: Low Fall Risk  Fall Risk Patient at Risk for Falls Due to: No Fall Risks Fall risk Follow up: Falls evaluation completed; Falls prevention discussed  Home and Transportation Safety: All rugs have non-skid backing?: N/A, no rugs All stairs or steps have railings?: yes Grab bars in the bathtub or shower?: yes Have non-skid surface in bathtub or shower?: yes Good home lighting?: yes Regular seat belt use?: yes  Cognitive Assessment Difficulty concentrating, remembering, or making decisions? : no Will 6CIT or Mini Cog be Completed: yes What year is it?: 0 points What month is it?: 0 points Give patient an address phrase to remember (5 components): [address] [place], [place] About what time is it?: 0 points Count backwards from 20 to 1: 0 points Say the months of the year in reverse: 0 points Repeat the address phrase from earlier: 0 points 6 CIT Score: 0 points  Advance Directives (For Healthcare) Does Patient Have a Medical Advance Directive?: No Would patient like information on creating a medical advance directive?: No - Guardian declined  Reviewed/Updated  Reviewed/Updated: All        Objective:    Today's Vitals    03/04/24 0821  BP: 112/62  Pulse: 60  SpO2: 97%  Weight: 189 lb (85.7 kg)  Height: 5' 5 (1.651 m)   Body mass index is 31.45 kg/m.  Current Medications (verified) Outpatient Encounter Medications as of 03/04/2024  Medication Sig   albuterol  (VENTOLIN  HFA) 108 (90 Base) MCG/ACT inhaler Inhale 1-2 puffs into the lungs every 6 (six) hours as needed for wheezing or shortness of breath.   alendronate (FOSAMAX) 70 MG tablet Take 70 mg by mouth once a week.   atorvastatin  (LIPITOR ) 80 MG tablet TAKE 1 TABLET BY MOUTH DAILY. PATIENT NEEDS TO MAKE AN APPOINTMENT FOR FURTHER REFILLS. 1ST ATTEMPT.   clopidogrel  (PLAVIX ) 75 MG tablet TAKE 1 TABLET BY MOUTH EVERY DAY WITH BREAKFAST   fluticasone  (FLONASE ) 50 MCG/ACT nasal spray Place 2 sprays into both nostrils daily.   hydrocortisone  2.5 % cream Apply topically 2 (two) times daily.   Hyoscyamine  Sulfate SL 0.125 MG SUBL Place 1-2 tablets under the tongue every 4 (four) hours as needed for cramping (stomach cramps).   ibuprofen  (ADVIL ) 200 MG tablet Take 200 mg by mouth every 6 (six) hours as needed.   levothyroxine  (SYNTHROID ) 100 MCG tablet Take 1 tablet (100 mcg total) by mouth daily before breakfast.   lidocaine  (LIDODERM ) 5 % Place 1 patch onto the skin daily. Remove & Discard patch within 12 hours or as directed by MD   Na Sulfate-K Sulfate-Mg Sulf 17.5-3.13-1.6 GM/177ML SOLN See admin instructions.   nitroGLYCERIN  (NITROSTAT ) 0.4 MG SL tablet Place 1 tablet (0.4 mg total) under the tongue every 5 (five) minutes as needed for chest pain.   Turmeric Curcumin (TURMERIC COMPLEX/BLACK PEPPER) 08-998 MG CAPS Take 1 capsule by mouth in the morning and at bedtime.   varenicline  (CHANTIX ) 0.5 MG tablet Take 0.5 mg by mouth daily.   varenicline  (CHANTIX ) 1 MG tablet TAKE 1 TABLET BY MOUTH TWICE A DAY   Vitamin D , Ergocalciferol , (DRISDOL )  1.25 MG (50000 UNIT) CAPS capsule Take 1 capsule (50,000 Units total) by mouth every 7 (seven) days.    methylPREDNISolone  (MEDROL  DOSEPAK) 4 MG TBPK tablet As directed (Patient not taking: Reported on 03/04/2024)   No facility-administered encounter medications on file as of 03/04/2024.   Hearing/Vision screen Hearing Screening - Comments:: Denies hearing difficulties   Vision Screening - Comments:: Wears rx glasses - up to date with routine eye exams with  [facility] Immunizations and Health Maintenance Health Maintenance  Topic Date Due   DTaP/Tdap/Td (2 - Td or Tdap) 07/09/2022   Zoster Vaccines- Shingrix (1 of 2) 06/04/2024 (Originally 01/28/2006)   Influenza Vaccine  07/29/2024 (Originally 11/30/2023)   Lung Cancer Screening  05/14/2024   Medicare Annual Wellness (AWV)  03/04/2025   Mammogram  01/03/2026   Colonoscopy  03/12/2026   Pneumococcal Vaccine: 50+ Years  Completed   DEXA SCAN  Completed   Hepatitis C Screening  Completed   Meningococcal B Vaccine  Aged Out   COVID-19 Vaccine  Discontinued        Assessment/Plan:  This is a routine wellness examination for Deborah Stewart.  Patient Care Team: Rollene Almarie LABOR, MD as PCP - General (Internal Medicine) Revankar, Jennifer SAUNDERS, MD as PCP - Cardiology (Cardiology) Jakie Alm SAUNDERS, MD (Gastroenterology) Raeanne Shanda SQUIBB, MD (Inactive) (Obstetrics and Gynecology)  I have personally reviewed and noted the following in the patient's chart:   Medical and social history Use of alcohol, tobacco or illicit drugs  Current medications and supplements including opioid prescriptions. Functional ability and status Nutritional status Physical activity Advanced directives List of other physicians Hospitalizations, surgeries, and ER visits in previous 12 months Vitals Screenings to include cognitive, depression, and falls Referrals and appointments  No orders of the defined types were placed in this encounter.  In addition, I have reviewed and discussed with patient certain preventive protocols, quality metrics, and best  practice recommendations. A written personalized care plan for preventive services as well as general preventive health recommendations were provided to patient.   Verdie CHRISTELLA Saba, CMA   03/04/2024   Return in 1 year (on 03/04/2025).  After Visit Summary: (In Person-Declined) Patient declined AVS at this time.  Nurse Notes: Scheduled 1-yr AWV/CPE in 03/2025

## 2024-03-04 NOTE — Progress Notes (Signed)
 Cardiology Office Note:    Date:  03/04/2024   ID:  Deborah Stewart, Deborah Stewart 01-19-1956, MRN [id number]  PCP:  Rollene Almarie LABOR, MD  Cardiologist:  Jennifer JONELLE Crape, MD   Referring MD: Rollene Almarie LABOR, *    ASSESSMENT:    1. Mixed hyperlipidemia   2. Coronary artery disease involving native coronary artery of native heart without angina pectoris   3. Primary hypertension   4. Obesity (BMI 30.0-34.9)    PLAN:    In order of problems listed above:  Coronary artery disease: Secondary prevention stressed with the patient.  Importance of compliance with diet medication stressed and patient verbalized standing.  She was advised to walk at least half an hour a day on a daily basis. Essential hypertension: Blood pressure is stable and diet was emphasized Mixed dyslipidemia: On lipid-lowering medications followed by primary care.  Goal LDL less than 60.  I reviewed lipids with her at length. Obesity: Weight reduction stressed diet emphasized and she promises to do better.  Risks of obesity explained. Patient will be seen in follow-up appointment in 6 months or earlier if the patient has any concerns.    Medication Adjustments/Labs and Tests Ordered: Current medicines are reviewed at length with the patient today.  Concerns regarding medicines are outlined above.  Orders Placed This Encounter  Procedures   EKG 12-Lead   No orders of the defined types were placed in this encounter.    No chief complaint on file.    History of Present Illness:    Deborah Stewart is a 68 y.o. female.  Patient has past medical history of coronary artery disease, essential hypertension, mixed dyslipidemia and obesity.  She leads a sedentary lifestyle.  She denies any chest pain orthopnea or PND.  She is not smoking at this time.  At the time of my evaluation, the patient is alert awake oriented and in no distress.  Past Medical History:  Diagnosis Date   Chest pain on breathing 09/20/2010    Chest CT - ?pleurisy  Pt stopped smoking 2 weeks ago  COPD/bronchitis/R ribs pain  Chest CT  Zpack  Hycodan prn  Medrol  pack  No evidence of DVT/PE  RTC to see Dr Rollene     Chronic back pain 07/08/2012   Pt has had lower back pain from a herniated disc for years, she is s/p cervical diskectomy.    Chronic left shoulder pain 07/15/2015   Chronic pain of left knee 07/15/2015   Coronary artery disease    10/18 PCI/DESx1 to mRCA.    DDD (degenerative disc disease), cervical    s/p diskectomy    Health care maintenance 07/08/2012   Pt is being followed by a gastroenterologist for her ulcerative colitis they manage her colonoscopies.  Pt has an appointment with her OB/Gyn in July.    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   History of colonic polyps 06/2010   hyperplastic, tubular adenoma  IMO SNOMED Dx Update Oct 2024     Hyperlipidemia    Hypertension    Hypothyroidism 11/10/2008   Qualifier: Diagnosis of  By: Jakie MD NOLIA Lenis R  Chronic problem s/p partial thyroidectomy     Lesion of vulva 09/25/2017   Numbness of fingers 05/21/2020   Obesity (BMI 30.0-34.9) 07/13/2020   Osteoporosis 08/28/2016   Pre-diabetes 11/04/2018   Right axillary fullness 06/12/2023   Right foot pain 11/18/2018   Snoring 01/14/2013   IMO SNOMED Dx Update Oct 2024  Solitary pulmonary nodule 04/10/2022   Subacute cough 05/15/2023   COPD/bronchitis/R ribs pain  Chest CT  Zpack  Hycodan prn  Medrol  pack  No evidence of DVT/PE  RTC to see Dr Rollene     Thyroid  nodule    Ulcerative colitis (HCC)    Vitamin D  deficiency 08/30/2016    Past Surgical History:  Procedure Laterality Date   ANTERIOR CERVICAL DECOMP/DISCECTOMY FUSION  2004   Dr. Malcolm SANES SURGERY     BREAST BIOPSY Bilateral    COLONOSCOPY     CORONARY ANGIOPLASTY WITH STENT PLACEMENT  02/06/2017   CORONARY STENT INTERVENTION N/A 02/06/2017   Procedure: CORONARY STENT INTERVENTION;  Surgeon: Claudene Victory ORN, MD;  Location: [facility];  Service: Cardiovascular;  Laterality: N/A;   FOOT SURGERY Right 09/2011    Dr. Charlie Blowers, DPO, removal of mass    FOOT SURGERY Left 1970s X 1   FOOT SURGERY Right 2000s multiple   botched 1st time; tried to correct several times   LEFT HEART CATH AND CORONARY ANGIOGRAPHY N/A 02/06/2017   Procedure: LEFT HEART CATH AND CORONARY ANGIOGRAPHY;  Surgeon: Claudene Victory ORN, MD;  Location: [facility];  Service: Cardiovascular;  Laterality: N/A;   THYROIDECTOMY, PARTIAL  06/2009   removed goiter   TOTAL ABDOMINAL HYSTERECTOMY     TOTAL ABDOMINAL HYSTERECTOMY W/ BILATERAL SALPINGOOPHORECTOMY     Current Medications: Current Meds  Medication Sig   albuterol  (VENTOLIN  HFA) 108 (90 Base) MCG/ACT inhaler Inhale 1-2 puffs into the lungs every 6 (six) hours as needed for wheezing or shortness of breath.   atorvastatin  (LIPITOR ) 80 MG tablet TAKE 1 TABLET BY MOUTH DAILY. PATIENT NEEDS TO MAKE AN APPOINTMENT FOR FURTHER REFILLS. 1ST ATTEMPT.   clopidogrel  (PLAVIX ) 75 MG tablet TAKE 1 TABLET BY MOUTH EVERY DAY WITH BREAKFAST   fluticasone  (FLONASE ) 50 MCG/ACT nasal spray Place 2 sprays into both nostrils daily.   hydrocortisone  2.5 % cream Apply topically 2 (two) times daily.   levothyroxine  (SYNTHROID ) 100 MCG tablet Take 1 tablet (100 mcg total) by mouth daily before breakfast.   lidocaine  (LIDODERM ) 5 % Place 1 patch onto the skin daily. Remove & Discard patch within 12 hours or as directed by MD   nitroGLYCERIN  (NITROSTAT ) 0.4 MG SL tablet Place 1 tablet (0.4 mg total) under the tongue every 5 (five) minutes as needed for chest pain.   Turmeric Curcumin (TURMERIC COMPLEX/BLACK PEPPER) 08-998 MG CAPS Take 1 capsule by mouth in the morning and at bedtime.   varenicline  (CHANTIX ) 1 MG tablet TAKE 1 TABLET BY MOUTH TWICE A DAY   Vitamin D , Ergocalciferol , (DRISDOL ) 1.25 MG (50000 UNIT) CAPS capsule Take 1 capsule (50,000 Units total) by mouth every 7 (seven) days.    [DISCONTINUED] alendronate (FOSAMAX) 70 MG tablet Take 70 mg by mouth once a week.   [DISCONTINUED] ibuprofen  (ADVIL ) 200 MG tablet Take 200 mg by mouth every 6 (six) hours as needed.     Allergies:   Codeine   Social History   Socioeconomic History   Marital status: Divorced    Spouse name: Not on file   Number of children: 1   Years of education: 12   Highest education level: Not on file  Occupational History   Occupation: filed Catering Manager: [employer]  Tobacco Use   Smoking status: Former    Current packs/day: 1.00    Average packs/day: 0.5 packs/day for 49.8 years (27.3 ttl pk-yrs)  Types: Cigarettes    Start date: 12/31/2018   Smokeless tobacco: Never   Tobacco comments:    taking Chantix   Vaping Use   Vaping status: Never Used  Substance and Sexual Activity   Alcohol use: Yes    Alcohol/week: 7.0 standard drinks of alcohol    Types: 1 Glasses of wine, 1 Shots of liquor, 5 Standard drinks or equivalent per week    Comment: occ   Drug use: Yes    Types: Marijuana    Comment: socially   Sexual activity: Yes    Partners: Male  Other Topics Concern   Not on file  Social History Narrative   [employer], [employer] -BS social work. Married 1980 - 16 yrs/divorced. 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 Lives alone. Dating - long term relationship/[place]. Unprotected. No history of physical or sexual abuse.    Social Drivers of Corporate Investment Banker Strain: Not on file  Food Insecurity: No Food Insecurity (03/04/2024)   Hunger Vital Sign    Worried About Running Out of Food in the Last Year: Never true    Ran Out of Food in the Last Year: Never true  Transportation Needs: No Transportation Needs (03/04/2024)   PRAPARE - Administrator, Civil Service (Medical): No    Lack of Transportation (Non-Medical): No  Physical Activity: Sufficiently Active (03/04/2024)   Exercise Vital Sign    Days of Exercise per Week: 5 days    Minutes of  Exercise per Session: 30 min  Stress: No Stress Concern Present (03/04/2024)   [employer] of Occupational Health - Occupational Stress Questionnaire    Feeling of Stress: Not at all  Social Connections: Moderately Integrated (03/04/2024)   Social Connection and Isolation Panel    Frequency of Communication with Friends and Family: More than three times a week    Frequency of Social Gatherings with Friends and Family: More than three times a week    Attends Religious Services: More than 4 times per year    Active Member of [employer] or Organizations: No    Attends Engineer, Structural: 1 to 4 times per year    Marital Status: Divorced     Family History: The patient's family history includes Breast cancer in her sister and another family member; Diabetes in her brother, father, mother, and another family member; Heart disease in her brother, father, and another family member; Hyperlipidemia in her brother; Hypertension in her brother and brother; Lung cancer in an other family member. There is no history of Colon cancer, Stomach cancer, Rectal cancer, or Esophageal cancer.  ROS:   Please see the history of present illness.    All other systems reviewed and are negative.  EKGs/Labs/Other Studies Reviewed:    The following studies were reviewed today: .SABRAEKG Interpretation Date/Time:  Tuesday March 04 2024 14:19:09 EST Ventricular Rate:  62 PR Interval:  138 QRS Duration:  84 QT Interval:  406 QTC Calculation: 412 R Axis:   64  Text Interpretation: Normal sinus rhythm Nonspecific ST and T wave abnormality When compared with ECG of 07-Feb-2017 05:22, T wave inversion now evident in Inferior leads Confirmed by Edwyna Backers [phone]) on 03/04/2024 2:47:23 PM     Recent Labs: 02/04/2024: ALT 16; BUN 13; Creatinine, Ser 0.68; Hemoglobin 14.6; Platelets 288.0; Potassium 3.9; Sodium 141; TSH 0.13  Recent Lipid Panel    Component Value Date/Time   CHOL 143 02/04/2024 1001    CHOL 148  01/14/2020 1006   TRIG 177.0 (H) 02/04/2024 1001   HDL 39.00 (L) 02/04/2024 1001   HDL 40 01/14/2020 1006   CHOLHDL 4 02/04/2024 1001   VLDL 35.4 02/04/2024 1001   LDLCALC 68 02/04/2024 1001   LDLCALC 84 01/14/2020 1006   LDLDIRECT 112.1 10/11/2012 1554    Physical Exam:    VS:  BP 118/70   Pulse 62   Ht 5' 5 (1.651 m)   Wt 190 lb 1.9 oz (86.2 kg)   SpO2 97%   BMI 31.64 kg/m     Wt Readings from Last 3 Encounters:  03/04/24 190 lb 1.9 oz (86.2 kg)  03/04/24 189 lb (85.7 kg)  03/04/24 189 lb (85.7 kg)     GEN: Patient is in no acute distress HEENT: Normal NECK: No JVD; No carotid bruits LYMPHATICS: No lymphadenopathy CARDIAC: Hear sounds regular, 2/6 systolic murmur at the apex. RESPIRATORY:  Clear to auscultation without rales, wheezing or rhonchi  ABDOMEN: Soft, non-tender, non-distended MUSCULOSKELETAL:  No edema; No deformity  SKIN: Warm and dry NEUROLOGIC:  Alert and oriented x 3 PSYCHIATRIC:  Normal affect   Signed, Jennifer JONELLE Crape, MD  03/04/2024 2:48 PM    [facility]

## 2024-03-17 ENCOUNTER — Other Ambulatory Visit: Payer: Self-pay | Admitting: Cardiology

## 2024-03-18 ENCOUNTER — Ambulatory Visit: Admitting: Orthopaedic Surgery

## 2024-03-18 DIAGNOSIS — G5602 Carpal tunnel syndrome, left upper limb: Secondary | ICD-10-CM

## 2024-03-18 DIAGNOSIS — G5601 Carpal tunnel syndrome, right upper limb: Secondary | ICD-10-CM

## 2024-03-18 DIAGNOSIS — G5603 Carpal tunnel syndrome, bilateral upper limbs: Secondary | ICD-10-CM | POA: Diagnosis not present

## 2024-03-18 NOTE — Progress Notes (Signed)
 Office Visit Note   Patient: Deborah Stewart           Date of Birth: 09-Oct-1955           MRN: [id number] Visit Date: 03/18/2024              Requested by: Purcell Emil Schanz, MD [address] [place],  [place] [address] PCP: Rollene Almarie LABOR, MD   Assessment & Plan: Visit Diagnoses:  1. Right carpal tunnel syndrome     Plan: History of Present Illness Deborah Stewart is a 68 year old female who presents with wrist pain and numbness for evaluation of suspected carpal tunnel syndrome. She was referred by her primary care physician for evaluation of suspected carpal tunnel syndrome.  She has experienced numbness and tingling in her right hand for about ten years, with symptoms more pronounced in the right hand than the left. EMG studies were conducted three years ago, but results are unavailable. Wearing a brace at night provides temporary relief, but symptoms return by the end of the day, especially after prolonged computer use. She has not received cortisone injections due to a strong aversion to needles. She is retired and spends significant time on the computer.  Physical Exam MUSCULOSKELETAL: No muscle wasting in the right hand. Compression of right carpal tunnel does not reproduce symptoms. Full range of motion in the right hand.  Assessment and Plan Bilateral carpal tunnel syndrome Chronic bilateral carpal tunnel syndrome, more severe in the right hand, with symptoms for ten years. Previous EMG three years ago. Symptoms worsen with computer use. Temporary relief with wrist brace. No muscle wasting. Declined cortisone injections. - Ordered EMG for both hands.  Follow-Up Instructions: No follow-ups on file.   Orders:  No orders of the defined types were placed in this encounter.  No orders of the defined types were placed in this encounter.     Procedures: No procedures performed   Clinical Data: No additional findings.   Subjective: Chief Complaint   Patient presents with   Right Hand - Numbness, Pain    HPI  Review of Systems  Constitutional: Negative.   HENT: Negative.    Eyes: Negative.   Respiratory: Negative.    Cardiovascular: Negative.   Endocrine: Negative.   Musculoskeletal: Negative.   Neurological: Negative.   Hematological: Negative.   Psychiatric/Behavioral: Negative.    All other systems reviewed and are negative.    Objective: Vital Signs: There were no vitals taken for this visit.  Physical Exam Vitals and nursing note reviewed.  Constitutional:      Appearance: She is well-developed.  HENT:     Head: Atraumatic.     Nose: Nose normal.  Eyes:     Extraocular Movements: Extraocular movements intact.  Cardiovascular:     Pulses: Normal pulses.  Pulmonary:     Effort: Pulmonary effort is normal.  Abdominal:     Palpations: Abdomen is soft.  Musculoskeletal:     Cervical back: Neck supple.  Skin:    General: Skin is warm.     Capillary Refill: Capillary refill takes less than 2 seconds.  Neurological:     Mental Status: She is alert. Mental status is at baseline.  Psychiatric:        Behavior: Behavior normal.        Thought Content: Thought content normal.        Judgment: Judgment normal.     Ortho Exam  Specialty Comments:  No specialty  comments available.  Imaging: No results found.   PMFS History: Patient Active Problem List   Diagnosis Date Noted   Paresthesia of both hands 03/04/2024   Right axillary fullness 06/12/2023   Subacute cough 05/15/2023   Solitary pulmonary nodule 04/10/2022   Obesity (BMI 30.0-34.9) 07/13/2020   Numbness of fingers 05/21/2020   Coronary artery disease    DDD (degenerative disc disease), cervical    Hyperlipidemia    Hypertension    Thyroid  nodule    Ulcerative colitis (HCC)    Right foot pain 11/18/2018   Pre-diabetes 11/04/2018   Lesion of vulva 09/25/2017   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   Vitamin D  deficiency  08/30/2016   Osteoporosis 08/28/2016   Chronic left shoulder pain 07/15/2015   Chronic pain of left knee 07/15/2015   Snoring 01/14/2013   Right carpal tunnel syndrome 10/11/2012   Health care maintenance 07/08/2012   Chronic back pain 07/08/2012   Chest pain on breathing 09/20/2010   History of colonic polyps 06/2010   Hypothyroidism 11/10/2008   Past Medical History:  Diagnosis Date   Chest pain on breathing 09/20/2010   Chest CT - ?pleurisy  Pt stopped smoking 2 weeks ago  COPD/bronchitis/R ribs pain  Chest CT  Zpack  Hycodan prn  Medrol  pack  No evidence of DVT/PE  RTC to see Dr Rollene     Chronic back pain 07/08/2012   Pt has had lower back pain from a herniated disc for years, she is s/p cervical diskectomy.    Chronic left shoulder pain 07/15/2015   Chronic pain of left knee 07/15/2015   Coronary artery disease    10/18 PCI/DESx1 to mRCA.    DDD (degenerative disc disease), cervical    s/p diskectomy    Health care maintenance 07/08/2012   Pt is being followed by a gastroenterologist for her ulcerative colitis they manage her colonoscopies.  Pt has an appointment with her OB/Gyn in July.    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   History of colonic polyps 06/2010   hyperplastic, tubular adenoma  IMO SNOMED Dx Update Oct 2024     Hyperlipidemia    Hypertension    Hypothyroidism 11/10/2008   Qualifier: Diagnosis of  By: Jakie MD NOLIA Lenis R  Chronic problem s/p partial thyroidectomy     Lesion of vulva 09/25/2017   Numbness of fingers 05/21/2020   Obesity (BMI 30.0-34.9) 07/13/2020   Osteoporosis 08/28/2016   Pre-diabetes 11/04/2018   Right axillary fullness 06/12/2023   Right foot pain 11/18/2018   Snoring 01/14/2013   IMO SNOMED Dx Update Oct 2024     Solitary pulmonary nodule 04/10/2022   Subacute cough 05/15/2023   COPD/bronchitis/R ribs pain  Chest CT  Zpack  Hycodan prn  Medrol  pack  No evidence of DVT/PE  RTC to see Dr Rollene     Thyroid  nodule     Ulcerative colitis (HCC)    Vitamin D  deficiency 08/30/2016    Family History  Problem Relation Age of Onset   Diabetes Mother    Diabetes Father    Heart disease Father        CHF   Breast cancer Sister    Diabetes Brother    Heart disease Brother    Hypertension Brother    Hyperlipidemia Brother    Hypertension Brother    Lung cancer Other        uncle   Breast cancer Other        great  aunt   Diabetes Other        grandparents   Heart disease Other        grandfather   Colon cancer Neg Hx    Stomach cancer Neg Hx    Rectal cancer Neg Hx    Esophageal cancer Neg Hx     Past Surgical History:  Procedure Laterality Date   ANTERIOR CERVICAL DECOMP/DISCECTOMY FUSION  2004   Dr. Malcolm SANES SURGERY     BREAST BIOPSY Bilateral    COLONOSCOPY     CORONARY ANGIOPLASTY WITH STENT PLACEMENT  02/06/2017   CORONARY STENT INTERVENTION N/A 02/06/2017   Procedure: CORONARY STENT INTERVENTION;  Surgeon: Claudene Victory ORN, MD;  Location: [facility];  Service: Cardiovascular;  Laterality: N/A;   FOOT SURGERY Right 09/2011    Dr. Charlie Blowers, DPO, removal of mass    FOOT SURGERY Left 1970s X 1   FOOT SURGERY Right 2000s multiple   botched 1st time; tried to correct several times   LEFT HEART CATH AND CORONARY ANGIOGRAPHY N/A 02/06/2017   Procedure: LEFT HEART CATH AND CORONARY ANGIOGRAPHY;  Surgeon: Claudene Victory ORN, MD;  Location: [facility];  Service: Cardiovascular;  Laterality: N/A;   THYROIDECTOMY, PARTIAL  06/2009   removed goiter   TOTAL ABDOMINAL HYSTERECTOMY     TOTAL ABDOMINAL HYSTERECTOMY W/ BILATERAL SALPINGOOPHORECTOMY    Social History   Occupational History   Occupation: filed Catering Manager: [employer]  Tobacco Use   Smoking status: Former    Current packs/day: 1.00    Average packs/day: 0.5 packs/day for 49.8 years (27.3 ttl pk-yrs)    Types: Cigarettes    Start date: 12/31/2018   Smokeless tobacco: Never   Tobacco comments:     taking Chantix   Vaping Use   Vaping status: Never Used  Substance and Sexual Activity   Alcohol use: Yes    Alcohol/week: 7.0 standard drinks of alcohol    Types: 1 Glasses of wine, 1 Shots of liquor, 5 Standard drinks or equivalent per week    Comment: occ   Drug use: Yes    Types: Marijuana    Comment: socially   Sexual activity: Yes    Partners: Male

## 2024-03-18 NOTE — Addendum Note (Signed)
 Addended by: Sharlynn Seckinger on: 03/18/2024 03:55 PM   Modules accepted: Orders

## 2024-03-26 ENCOUNTER — Encounter: Payer: Self-pay | Admitting: Pharmacist

## 2024-03-26 NOTE — Progress Notes (Signed)
 Pharmacy Quality Measure Review  This patient is appearing on a report for being at risk of failing the adherence measure for cholesterol (statin) medications this calendar year.   Medication: Atorvastatin  Last fill date: 03/20/24 for 90 day supply  Insurance report was not up to date. No action needed at this time.   Darrelyn Drum, PharmD, BCPS, CPP Clinical Pharmacist Practitioner [facility] [phone]

## 2024-04-15 ENCOUNTER — Encounter: Admitting: Physical Medicine and Rehabilitation

## 2024-04-23 ENCOUNTER — Encounter: Admitting: Physical Medicine and Rehabilitation

## 2024-04-23 NOTE — Progress Notes (Deleted)
 "  MARJORIA Stewart - 68 y.o. female MRN [id number]  Date of birth: 08-14-1955  Office Visit Note: Visit Date: 04/23/2024 PCP: Rollene Almarie LABOR, MD Referred by: Rollene Almarie LABOR, MD  Subjective: No chief complaint on file.  HPI: Deborah Stewart is a 68 y.o. female who comes in today  wrist pain and numbness for evaluation of suspected carpal tunnel syndrome. She was referred by her primary care physician for evaluation of suspected carpal tunnel syndrome.   She has experienced numbness and tingling in her right hand for about ten years, with symptoms more pronounced in the right hand than the left. EMG studies were conducted three years ago, but results are unavailable. Wearing a brace at night provides temporary relief, but symptoms return by the end of the day, especially after prolonged computer use. She has not received cortisone injections due to a strong aversion to needles. She is retired and spends significant time on the computer.       ROS Otherwise per HPI.  Assessment & Plan: Visit Diagnoses: No diagnosis found.   Plan: No additional findings.   Meds & Orders: No orders of the defined types were placed in this encounter.  No orders of the defined types were placed in this encounter.   Follow-up: No follow-ups on file.   Procedures: No procedures performed      Clinical History: No specialty comments available.   She reports that she has quit smoking. Her smoking use included cigarettes. She started smoking about 5 years ago. She has a 27.4 pack-year smoking history. She has never used smokeless tobacco.  Recent Labs    02/04/24 1001  HGBA1C 6.4    Objective:  VS:  HT:    WT:   BMI:     BP:   HR: bpm  TEMP: ( )  RESP:  Physical Exam  Ortho Exam  Imaging: No results found.  Past Medical/Family/Surgical/Social History: Medications & Allergies reviewed per EMR, new medications updated. Patient Active Problem List   Diagnosis Date Noted    Paresthesia of both hands 03/04/2024   Right axillary fullness 06/12/2023   Subacute cough 05/15/2023   Solitary pulmonary nodule 04/10/2022   Obesity (BMI 30.0-34.9) 07/13/2020   Numbness of fingers 05/21/2020   Coronary artery disease    DDD (degenerative disc disease), cervical    Hyperlipidemia    Hypertension    Thyroid  nodule    Ulcerative colitis (HCC)    Right foot pain 11/18/2018   Pre-diabetes 11/04/2018   Lesion of vulva 09/25/2017   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   Vitamin D  deficiency 08/30/2016   Osteoporosis 08/28/2016   Chronic left shoulder pain 07/15/2015   Chronic pain of left knee 07/15/2015   Snoring 01/14/2013   Right carpal tunnel syndrome 10/11/2012   Health care maintenance 07/08/2012   Chronic back pain 07/08/2012   Chest pain on breathing 09/20/2010   History of colonic polyps 06/2010   Hypothyroidism 11/10/2008   Past Medical History:  Diagnosis Date   Chest pain on breathing 09/20/2010   Chest CT - ?pleurisy  Pt stopped smoking 2 weeks ago  COPD/bronchitis/R ribs pain  Chest CT  Zpack  Hycodan prn  Medrol  pack  No evidence of DVT/PE  RTC to see Dr Rollene     Chronic back pain 07/08/2012   Pt has had lower back pain from a herniated disc for years, she is s/p cervical diskectomy.    Chronic left shoulder pain 07/15/2015  Chronic pain of left knee 07/15/2015   Coronary artery disease    10/18 PCI/DESx1 to mRCA.    DDD (degenerative disc disease), cervical    s/p diskectomy    Health care maintenance 07/08/2012   Pt is being followed by a gastroenterologist for her ulcerative colitis they manage her colonoscopies.  Pt has an appointment with her OB/Gyn in July.    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   History of colonic polyps 06/2010   hyperplastic, tubular adenoma  IMO SNOMED Dx Update Oct 2024     Hyperlipidemia    Hypertension    Hypothyroidism 11/10/2008   Qualifier: Diagnosis of   By: Jakie MD NOLIA Lenis R  Chronic problem s/p partial thyroidectomy     Lesion of vulva 09/25/2017   Numbness of fingers 05/21/2020   Obesity (BMI 30.0-34.9) 07/13/2020   Osteoporosis 08/28/2016   Pre-diabetes 11/04/2018   Right axillary fullness 06/12/2023   Right foot pain 11/18/2018   Snoring 01/14/2013   IMO SNOMED Dx Update Oct 2024     Solitary pulmonary nodule 04/10/2022   Subacute cough 05/15/2023   COPD/bronchitis/R ribs pain  Chest CT  Zpack  Hycodan prn  Medrol  pack  No evidence of DVT/PE  RTC to see Dr Rollene     Thyroid  nodule    Ulcerative colitis (HCC)    Vitamin D  deficiency 08/30/2016   Family History  Problem Relation Age of Onset   Diabetes Mother    Diabetes Father    Heart disease Father        CHF   Breast cancer Sister    Diabetes Brother    Heart disease Brother    Hypertension Brother    Hyperlipidemia Brother    Hypertension Brother    Lung cancer Other        uncle   Breast cancer Other        great aunt   Diabetes Other        grandparents   Heart disease Other        grandfather   Colon cancer Neg Hx    Stomach cancer Neg Hx    Rectal cancer Neg Hx    Esophageal cancer Neg Hx    Past Surgical History:  Procedure Laterality Date   ANTERIOR CERVICAL DECOMP/DISCECTOMY FUSION  2004   Dr. Malcolm   BACK SURGERY     BREAST BIOPSY Bilateral    COLONOSCOPY     CORONARY ANGIOPLASTY WITH STENT PLACEMENT  02/06/2017   CORONARY STENT INTERVENTION N/A 02/06/2017   Procedure: CORONARY STENT INTERVENTION;  Surgeon: Claudene Victory ORN, MD;  Location: [facility];  Service: Cardiovascular;  Laterality: N/A;   FOOT SURGERY Right 09/2011    Dr. Charlie Blowers, DPO, removal of mass    FOOT SURGERY Left 1970s X 1   FOOT SURGERY Right 2000s multiple   botched 1st time; tried to correct several times   LEFT HEART CATH AND CORONARY ANGIOGRAPHY N/A 02/06/2017   Procedure: LEFT HEART CATH AND CORONARY  ANGIOGRAPHY;  Surgeon: Claudene Victory ORN, MD;  Location: [facility];  Service: Cardiovascular;  Laterality: N/A;   THYROIDECTOMY, PARTIAL  06/2009   removed goiter   TOTAL ABDOMINAL HYSTERECTOMY     TOTAL ABDOMINAL HYSTERECTOMY W/ BILATERAL SALPINGOOPHORECTOMY    Social History   Occupational History   Occupation: filed Catering Manager: [employer]  Tobacco Use   Smoking status: Former    Current packs/day: 1.00  Average packs/day: 0.5 packs/day for 49.9 years (27.4 ttl pk-yrs)    Types: Cigarettes    Start date: 12/31/2018   Smokeless tobacco: Never   Tobacco comments:    taking Chantix   Vaping Use   Vaping status: Never Used  Substance and Sexual Activity   Alcohol use: Yes    Alcohol/week: 7.0 standard drinks of alcohol    Types: 1 Glasses of wine, 1 Shots of liquor, 5 Standard drinks or equivalent per week    Comment: occ   Drug use: Yes    Types: Marijuana    Comment: socially   Sexual activity: Yes    Partners: Male   "

## 2024-04-26 ENCOUNTER — Other Ambulatory Visit: Payer: Self-pay | Admitting: Cardiology

## 2024-04-26 DIAGNOSIS — I2510 Atherosclerotic heart disease of native coronary artery without angina pectoris: Secondary | ICD-10-CM

## 2024-05-03 ENCOUNTER — Other Ambulatory Visit: Payer: Self-pay | Admitting: Internal Medicine

## 2024-05-20 ENCOUNTER — Telehealth: Payer: Self-pay | Admitting: Physical Medicine and Rehabilitation

## 2024-05-20 NOTE — Telephone Encounter (Signed)
 Pt called saying that she is wanting to have a nerve conduction study. She is aware that the machine is down, but thought we forgot about her since no one has called her back. I assured her that we hadn't forgotten about her, but that the machine is still down. Call back number is [phone].

## 2024-05-29 ENCOUNTER — Ambulatory Visit: Admitting: Orthopaedic Surgery

## 2024-05-30 ENCOUNTER — Encounter: Payer: Self-pay | Admitting: Internal Medicine

## 2024-05-30 ENCOUNTER — Ambulatory Visit: Admitting: Physical Medicine and Rehabilitation

## 2024-05-30 DIAGNOSIS — R202 Paresthesia of skin: Secondary | ICD-10-CM

## 2024-05-30 NOTE — Progress Notes (Unsigned)
 Pain Scale   Average Pain 5 Patient advising she has chronic bilateral numbness and tingling to hands. Patients advising she is Right hand dominate        +Driver, -BT, -Dye Allergies.

## 2024-06-04 ENCOUNTER — Encounter: Payer: Self-pay | Admitting: Internal Medicine

## 2024-06-04 ENCOUNTER — Ambulatory Visit: Admitting: Internal Medicine

## 2024-06-04 VITALS — BP 128/80 | HR 71 | Temp 97.9°F | Ht 65.0 in | Wt 198.4 lb

## 2024-06-04 DIAGNOSIS — E66811 Obesity, class 1: Secondary | ICD-10-CM

## 2024-06-04 DIAGNOSIS — R42 Dizziness and giddiness: Secondary | ICD-10-CM

## 2024-06-04 DIAGNOSIS — R7303 Prediabetes: Secondary | ICD-10-CM

## 2024-06-04 DIAGNOSIS — E559 Vitamin D deficiency, unspecified: Secondary | ICD-10-CM

## 2024-06-04 LAB — COMPREHENSIVE METABOLIC PANEL WITH GFR
ALT: 18 U/L (ref 3–35)
AST: 24 U/L (ref 5–37)
Albumin: 4.3 g/dL (ref 3.5–5.2)
Alkaline Phosphatase: 85 U/L (ref 39–117)
BUN: 11 mg/dL (ref 6–23)
CO2: 30 meq/L (ref 19–32)
Calcium: 9.6 mg/dL (ref 8.4–10.5)
Chloride: 101 meq/L (ref 96–112)
Creatinine, Ser: 0.64 mg/dL (ref 0.40–1.20)
GFR: 90.85 mL/min
Glucose, Bld: 99 mg/dL (ref 70–99)
Potassium: 3.8 meq/L (ref 3.5–5.1)
Sodium: 140 meq/L (ref 135–145)
Total Bilirubin: 0.5 mg/dL (ref 0.2–1.2)
Total Protein: 7.6 g/dL (ref 6.0–8.3)

## 2024-06-04 LAB — HEMOGLOBIN A1C: Hgb A1c MFr Bld: 6.4 % (ref 4.6–6.5)

## 2024-06-04 LAB — CBC
HCT: 45.6 % (ref 36.0–46.0)
Hemoglobin: 15.1 g/dL — ABNORMAL HIGH (ref 12.0–15.0)
MCHC: 33 g/dL (ref 30.0–36.0)
MCV: 90.5 fl (ref 78.0–100.0)
Platelets: 327 10*3/uL (ref 150.0–400.0)
RBC: 5.04 Mil/uL (ref 3.87–5.11)
RDW: 14.3 % (ref 11.5–15.5)
WBC: 9.4 10*3/uL (ref 4.0–10.5)

## 2024-06-04 LAB — VITAMIN D 25 HYDROXY (VIT D DEFICIENCY, FRACTURES): VITD: 29.81 ng/mL — ABNORMAL LOW (ref 30.00–100.00)

## 2024-06-04 MED ORDER — HYOSCYAMINE SULFATE SL 0.125 MG SL SUBL: 1.0000 | SUBLINGUAL_TABLET | SUBLINGUAL | 3 refills | Status: AC | PRN

## 2024-06-04 NOTE — Procedures (Unsigned)
 EMG & NCV Findings: Evaluation of the left median motor nerve showed prolonged distal onset latency (4.5 ms).  The right median motor nerve showed prolonged distal onset latency (4.5 ms) and decreased conduction velocity (Elbow-Wrist, 48 m/s).  The left median (across palm) sensory nerve showed prolonged distal peak latency (Wrist, 4.9 ms) and prolonged distal peak latency (Palm, 3.2 ms).  The right median (across palm) sensory nerve showed no response (Palm) and prolonged distal peak latency (5.2 ms).  All remaining nerves (as indicated in the following tables) were within normal limits.  All left vs. right side differences were within normal limits.    All examined muscles (as indicated in the following table) showed no evidence of electrical instability.    Impression: The above electrodiagnostic study is ABNORMAL and reveals evidence of:  a moderate right median nerve entrapment at the wrist (carpal tunnel syndrome) affecting sensory and motor components.  a mild to moderate left median nerve entrapment at the wrist (carpal tunnel syndrome) affecting sensory and motor components.    There is no significant electrodiagnostic evidence of any other focal nerve entrapment, brachial plexopathy or cervical radiculopathy.   Recommendations: 1.  Follow-up with referring physician. 2.  Continue current management of symptoms. 3.  Continue use of resting splint at night-time and as needed during the day. 4.  Suggest surgical evaluation.  ___________________________ Prentice Masters FAAPMR Board Certified, American Board of Physical Medicine and Rehabilitation    Nerve Conduction Studies Anti Sensory Summary Table   Stim Site NR Peak (ms) Norm Peak (ms) P-T Amp (V) Norm P-T Amp Site1 Site2 Delta-P (ms) Dist (cm) Vel (m/s) Norm Vel (m/s)  Left Median Acr Palm Anti Sensory (2nd Digit)  28.9C  Wrist    *4.9 <3.6 25.9 >10 Wrist Palm 1.7 0.0    Palm    *3.2 <2.0 2.5         Right Median Acr Palm  Anti Sensory (2nd Digit)  29.1C  Wrist    *5.2 <3.6 19.3 >10 Wrist Palm  0.0    Palm *NR  <2.0          Right Radial Anti Sensory (Base 1st Digit)  29.1C  Wrist    2.3 <3.1 45.2  Wrist Base 1st Digit 2.3 0.0    Right Ulnar Anti Sensory (5th Digit)  29.6C  Wrist    3.6 <3.7 50.9 >15.0 Wrist 5th Digit 3.6 14.0 39 >38   Motor Summary Table   Stim Site NR Onset (ms) Norm Onset (ms) O-P Amp (mV) Norm O-P Amp Site1 Site2 Delta-0 (ms) Dist (cm) Vel (m/s) Norm Vel (m/s)  Left Median Motor (Abd Poll Brev)  29.3C  Wrist    *4.5 <4.2 7.6 >5 Elbow Wrist 4.2 21.5 51 >50  Elbow    8.7  6.8         Right Median Motor (Abd Poll Brev)  29.3C  Wrist    *4.5 <4.2 10.6 >5 Elbow Wrist 4.4 21.0 *48 >50  Elbow    8.9  9.5         Right Ulnar Motor (Abd Dig Min)  29.4C  Wrist    2.7 <4.2 12.4 >3 B Elbow Wrist 3.6 19.5 54 >53  B Elbow    6.3  12.4  A Elbow B Elbow 1.4 10.0 71 >53  A Elbow    7.7  11.6          EMG   Side Muscle Nerve Root Ins Act Fibs Psw Amp Dur  Poly Recrt Int Bruna Comment  Right Abd Poll Brev Median C8-T1 Nml Nml Nml Nml Nml 0 Nml Nml   Right 1stDorInt Ulnar C8-T1 Nml Nml Nml Nml Nml 0 Nml Nml     Nerve Conduction Studies Anti Sensory Left/Right Comparison   Stim Site L Lat (ms) R Lat (ms) L-R Lat (ms) L Amp (V) R Amp (V) L-R Amp (%) Site1 Site2 L Vel (m/s) R Vel (m/s) L-R Vel (m/s)  Median Acr Palm Anti Sensory (2nd Digit)  28.9C  Wrist *4.9 *5.2 0.3 25.9 19.3 25.5 Wrist Palm     Palm *3.2   2.5         Radial Anti Sensory (Base 1st Digit)  29.1C  Wrist  2.3   45.2  Wrist Base 1st Digit     Ulnar Anti Sensory (5th Digit)  29.6C  Wrist  3.6   50.9  Wrist 5th Digit  39    Motor Left/Right Comparison   Stim Site L Lat (ms) R Lat (ms) L-R Lat (ms) L Amp (mV) R Amp (mV) L-R Amp (%) Site1 Site2 L Vel (m/s) R Vel (m/s) L-R Vel (m/s)  Median Motor (Abd Poll Brev)  29.3C  Wrist *4.5 *4.5 0.0 7.6 10.6 28.3 Elbow Wrist 51 *48 3  Elbow 8.7 8.9 0.2 6.8 9.5 28.4       Ulnar  Motor (Abd Dig Min)  29.4C  Wrist  2.7   12.4  B Elbow Wrist  54   B Elbow  6.3   12.4  A Elbow B Elbow  71   A Elbow  7.7   11.6           Waveforms:

## 2024-06-04 NOTE — Patient Instructions (Signed)
 Topiramate is an option for weight loss which is generic.   Naltrexone is another option for weight loss which is generic.

## 2024-06-04 NOTE — Progress Notes (Unsigned)
 "  Subjective:   Patient ID: Deborah Stewart, female    DOB: 1955/06/14, 69 y.o.   MRN: [id number]  Discussed the use of AI scribe software for clinical note transcription with the patient, who gave verbal consent to proceed.  History of Present Illness Deborah Stewart is a 69 year old female who presents with difficulty losing weight and recent episodes of vertigo.  She has been experiencing difficulty losing weight despite efforts to manage her diet and exercise. Her weight has been increasing despite her efforts to lose weight. 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  She describes a recent episode of vertigo that occurred two weeks ago. She fell asleep on her sun porch and awoke at 2 AM to find the room spinning. This episode was frightening, especially as she lives alone, and it took some time before she could stand and walk. She was afraid to go back to sleep until daylight.  She faces challenges with food cravings and urges, often desiring specific foods even when not hungry. She tries to manage these cravings by keeping healthier options available and making dietary changes, such as using honey instead of sugar in her coffee. Financial constraints impact her ability to purchase healthy foods.  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 She is on a fixed income, which influences her dietary choices and ability to purchase certain foods.  Review of Systems  Constitutional: Negative.   HENT: Negative.    Eyes: Negative.   Respiratory:  Negative for cough, chest tightness and shortness of breath.   Cardiovascular:  Negative for chest pain, palpitations and leg swelling.  Gastrointestinal:  Negative for abdominal distention, abdominal pain, constipation, diarrhea, nausea and vomiting.  Musculoskeletal: Negative.   Skin: Negative.    Neurological:  Positive for dizziness.  Psychiatric/Behavioral: Negative.      Objective:  Physical Exam Constitutional:      Appearance: She is well-developed.  HENT:     Head: Normocephalic and atraumatic.  Cardiovascular:     Rate and Rhythm: Normal rate and regular rhythm.  Pulmonary:     Effort: Pulmonary effort is normal. No respiratory distress.     Breath sounds: Normal breath sounds. No wheezing or rales.  Abdominal:     General: Bowel sounds are normal. There is no distension.     Palpations: Abdomen is soft.     Tenderness: There is no abdominal tenderness.  Musculoskeletal:     Cervical back: Normal range of motion.  Skin:    General: Skin is warm and dry.  Neurological:     Mental Status: She is alert and oriented to person, place, and time.     Coordination: Coordination normal.     Vitals:   06/04/24 0915  BP: 128/80  Pulse: 71  Temp: 97.9 F (36.6 C)  TempSrc: Oral  SpO2: 98%  Weight: 198 lb 6.4 oz (90 kg)  Height: 5' 5 (1.651 m)    Assessment and Plan Assessment & Plan Obesity   She has difficulty losing weight despite dietary efforts and prefers lifestyle modifications. She declined metformin due to concerns. Discussed topiramate and naltrexone for appetite suppression and craving reduction, emphasizing the importance of monitoring medication use. Encouraged dietary modifications and exercise. Ordered blood work to assess current metabolic status.  Prediabetes   This condition contributes to her weight management challenges.  She declined metformin and emphasized lifestyle modifications as the primary management strategy. Ordered blood work to reassess glucose levels and metabolic status.  Dizziness   She experienced a recent episode of dizziness upon waking, which resolved spontaneously. Concerned about potential safety risks due to living alone. Ordered blood work to evaluate potential causes of dizziness.    "

## 2024-06-12 ENCOUNTER — Ambulatory Visit: Admitting: Orthopaedic Surgery

## 2025-03-10 ENCOUNTER — Encounter: Admitting: Internal Medicine

## 2025-03-10 ENCOUNTER — Ambulatory Visit
